# Patient Record
Sex: Female | Born: 1965 | Race: Black or African American | Hispanic: No | Smoking: Never smoker
Health system: Southern US, Community
[De-identification: ages and names within clinical notes are randomized; demographics above are authoritative.]

## PROBLEM LIST (undated history)

## (undated) DIAGNOSIS — D649 Anemia, unspecified: Secondary | ICD-10-CM

## (undated) DIAGNOSIS — R51 Headache: Secondary | ICD-10-CM

## (undated) DIAGNOSIS — D3500 Benign neoplasm of unspecified adrenal gland: Secondary | ICD-10-CM

## (undated) DIAGNOSIS — E119 Type 2 diabetes mellitus without complications: Secondary | ICD-10-CM

## (undated) DIAGNOSIS — I10 Essential (primary) hypertension: Secondary | ICD-10-CM

## (undated) DIAGNOSIS — I7300 Raynaud's syndrome without gangrene: Secondary | ICD-10-CM

## (undated) DIAGNOSIS — K219 Gastro-esophageal reflux disease without esophagitis: Secondary | ICD-10-CM

## (undated) DIAGNOSIS — E269 Hyperaldosteronism, unspecified: Secondary | ICD-10-CM

## (undated) DIAGNOSIS — T7840XA Allergy, unspecified, initial encounter: Secondary | ICD-10-CM

## (undated) DIAGNOSIS — R519 Headache, unspecified: Secondary | ICD-10-CM

## (undated) DIAGNOSIS — E785 Hyperlipidemia, unspecified: Secondary | ICD-10-CM

## (undated) DIAGNOSIS — E876 Hypokalemia: Secondary | ICD-10-CM

## (undated) DIAGNOSIS — N804 Endometriosis of rectovaginal septum, unspecified involvement of vagina: Secondary | ICD-10-CM

## (undated) DIAGNOSIS — N83209 Unspecified ovarian cyst, unspecified side: Secondary | ICD-10-CM

## (undated) HISTORY — DX: Hyperaldosteronism, unspecified: E26.9

## (undated) HISTORY — DX: Hypokalemia: E87.6

## (undated) HISTORY — PX: TUBAL LIGATION: SHX77

## (undated) HISTORY — DX: Essential (primary) hypertension: I10

## (undated) HISTORY — PX: CARDIAC CATHETERIZATION: SHX172

## (undated) HISTORY — DX: Anemia, unspecified: D64.9

## (undated) HISTORY — DX: Benign neoplasm of unspecified adrenal gland: D35.00

## (undated) HISTORY — DX: Endometriosis of rectovaginal septum and vagina: N80.4

## (undated) HISTORY — DX: Raynaud's syndrome without gangrene: I73.00

## (undated) HISTORY — DX: Hyperlipidemia, unspecified: E78.5

## (undated) HISTORY — DX: Type 2 diabetes mellitus without complications: E11.9

## (undated) HISTORY — DX: Allergy, unspecified, initial encounter: T78.40XA

## (undated) HISTORY — DX: Endometriosis of rectovaginal septum, unspecified involvement of vagina: N80.40

---

## 2007-09-17 ENCOUNTER — Encounter (INDEPENDENT_AMBULATORY_CARE_PROVIDER_SITE_OTHER): Payer: Self-pay | Admitting: Obstetrics and Gynecology

## 2007-09-17 ENCOUNTER — Ambulatory Visit (HOSPITAL_COMMUNITY): Admission: RE | Admit: 2007-09-17 | Discharge: 2007-09-17 | Payer: Self-pay | Admitting: Obstetrics and Gynecology

## 2008-01-22 ENCOUNTER — Emergency Department (HOSPITAL_COMMUNITY): Admission: EM | Admit: 2008-01-22 | Discharge: 2008-01-22 | Payer: Self-pay | Admitting: Family Medicine

## 2008-09-11 ENCOUNTER — Encounter: Admission: RE | Admit: 2008-09-11 | Discharge: 2008-09-11 | Payer: Self-pay | Admitting: Obstetrics and Gynecology

## 2008-11-19 ENCOUNTER — Encounter: Admission: RE | Admit: 2008-11-19 | Discharge: 2008-11-19 | Payer: Self-pay | Admitting: Cardiology

## 2008-11-24 ENCOUNTER — Ambulatory Visit (HOSPITAL_COMMUNITY): Admission: RE | Admit: 2008-11-24 | Discharge: 2008-11-24 | Payer: Self-pay | Admitting: Cardiology

## 2008-12-21 ENCOUNTER — Encounter: Admission: RE | Admit: 2008-12-21 | Discharge: 2009-03-21 | Payer: Self-pay | Admitting: Internal Medicine

## 2010-09-12 LAB — BASIC METABOLIC PANEL
CO2: 27 mEq/L (ref 19–32)
Calcium: 9 mg/dL (ref 8.4–10.5)
Calcium: 9.2 mg/dL (ref 8.4–10.5)
Creatinine, Ser: 0.65 mg/dL (ref 0.4–1.2)
GFR calc Af Amer: >60 mL/min (ref >60)
GFR calc Af Amer: >60 mL/min (ref >60)
GFR calc non Af Amer: >60 mL/min (ref >60)
Sodium: 139 mEq/L (ref 135–145)

## 2010-09-12 LAB — GLUCOSE, CAPILLARY: Glucose-Capillary: 106 mg/dL — ABNORMAL HIGH (ref 70–99)

## 2010-10-18 NOTE — Cardiovascular Report (Signed)
NAME:  Kristin Hess, THAKER NO.:  [id number]   MEDICAL RECORD NO.:  [id number]          PATIENT TYPE:  OIB   LOCATION:  2899                         FACILITY:  [facility]   PHYSICIAN:  Cristy Hilts. Jacinto Halim, MD       DATE OF BIRTH:  [birth year]   DATE OF PROCEDURE:  11/24/2008  DATE OF DISCHARGE:  11/24/2008                            CARDIAC CATHETERIZATION   PRIMARY OPERATOR:  Richard A. Alanda Amass, MD   PROCTORING PHYSICIAN:  Cristy Hilts. Jacinto Halim, MD   PROCEDURE PERFORMED:  Coronary arteriography including selective left  and right coronary arteriography.   INDICATIONS:  Ms. [name] is a 45 year old female with a diet-  controlled diabetes, hypertension, and obesity, who has been complaining  of chest pain and dyspnea on exertion.  She had undergone a stress test,  started on outpatient basis, which had revealed antral ischemia.  Hence  she is brought to the cardiac catheterization lab to evaluate her  coronary anatomy.   HEMODYNAMIC DATA:  The aortic pressure was 129/84 with a mean of 105  mmHg.   Left main coronary artery:  Left main coronary artery is a large-caliber  vessel, which immediately bifurcates into the circumflex and LAD.   Circumflex:  Circumflex is a large-caliber vessel.  It gives origin to a  tiny obtuse marginal 1, large obtuse marginal 2, and distally gives  origin to PDA branches.  It is codominant with right coronary artery.   LAD:  LAD is a large-caliber vessel.  Smooth and normal.   Right coronary artery:  Right coronary artery is codominant with  circumflex coronary artery giving origin to small PDA and PLA branches.  Smooth and normal.   IMPRESSION:  False positive stress test probably related to breast  attenuation.  Normal coronary arteries.   RECOMMENDATIONS:  The patient has significant cardiovascular risk that  includes diabetes, hypertension, and morbid obesity.  She will need very  strict primary control, primary prevention strategy.   She is being placed on lisinopril and hydrochlorothiazide because of  severe hypokalemia.  We will change it lisinopril 20 mg once a day.  She  will continue with low dose of her beta-blocker and along with aspirin  at 81 mg p.o. daily.  She will be discharged home today.   TECHNIQUE OF PROCEDURE:  Under usual sterile precautions using a 5-  French right radial access, a 5-French Jackey catheter was advanced into  the ascending aorta and selective right and left coronary arteriography  was performed.  The catheter was then pulled out of the body.  The  patient tolerated the procedure well.  The right radial access  hemostasis obtained using a TR band injecting 10 mL of air.  No  immediate complications noted.      Cristy Hilts. Jacinto Halim, MD     JRG/MEDQ  D:  11/24/2008  T:  11/24/2008  Job:  [id number]   cc:   Tammy R. Collins [place], M.D.  Richard A. Alanda Amass, M.D.

## 2010-10-18 NOTE — Op Note (Signed)
NAME:  Kristin Hess, Kristin Hess NO.:  [id number]   MEDICAL RECORD NO.:  [id number]          PATIENT TYPE:  AMB   LOCATION:  SDC                           FACILITY:  [facility]   PHYSICIAN:  Osborn Coho, M.D.   DATE OF BIRTH:  03-09-1966   DATE OF PROCEDURE:  09/17/2007  DATE OF DISCHARGE:                               OPERATIVE REPORT   PREOPERATIVE DIAGNOSES:  1. Menorrhagia  2. Polyps.   POSTOPERATIVE DIAGNOSES:  1. Menorrhagia  2. Polyps.   PROCEDURE:  1. Hysteroscopy.  2. D&C.  3. NovaSure ablation.   ATTENDING:  Dr. Osborn Coho.   ANESTHESIA:  General via LMA.   SPECIMENS TO PATHOLOGY:  Endometrial curettings.   FLUIDS:  600 mL in the OR, 1300 mL in day surgery prior to procedure.   URINE OUTPUT:  Greater than 500 mL via straight catheter prior to  procedure.   HYSTEROSCOPIC FLUID DEFICIT:  Of lactated Ringer's 185 mL.   ESTIMATED BLOOD LOSS:  Minimal.   COMPLICATIONS:  None.   PROCEDURE:  The patient was taken to the operating room.  After risks,  benefits, and alternatives discussed with the patient, the patient  verbalized understanding and consent signed and witnessed.  The patient  was placed under general per anesthesia and prepped and draped in the  normal sterile fashion in the dorsal lithotomy position.  A bivalve  speculum was placed in the patient's vagina, and a paracervical block  administered using a total of 10 mL of 1% lidocaine.  The anterior lip  of the cervix was grasped with a single-tooth tenaculum, and the cervix  was measured to 4.5 cm.  The uterus sounded to 11 cm.  The cervix was  dilated for passage of the hysteroscope.  The hysteroscope was  introduced and multiple polyps noted.  Curettage was performed until a  gritty texture was noted.  However, on the posterior and left lateral  sidewall, it felt somewhat smooth still.  Hysteroscope introduced once  again, and no obvious intracavitary lesions were noted.  The NovaSure  instrument was placed into the uterine cavity, and the cavity width  measured 5.0 cm, and while the instrument was set at 6.5 cm for cavity  length, ablation was performed without difficulty at a power 179 watts  for a time of 43 seconds.  The hysteroscope was reintroduced into the  cavity after NovaSure instrument was removed, and good results of  ablation was noted throughout except on a small portion of the lower  uterine segment on the right side of the uterus.  All instruments were  then removed, and there was good hemostasis at the tenaculum site.  Count was correct.  The patient tolerated the procedure well and was  awaiting transfer to the recovery room in good condition.      Osborn Coho, M.D.  Electronically Signed     AR/MEDQ  D:  09/17/2007  T:  09/17/2007  Job:  [id number]

## 2011-02-28 LAB — CBC
MCHC: 33.6
Platelets: 214
RBC: 4.45
WBC: 6.7

## 2011-02-28 LAB — HCG, SERUM, QUALITATIVE: Preg, Serum: NEGATIVE

## 2011-08-25 ENCOUNTER — Other Ambulatory Visit: Payer: Self-pay | Admitting: Internal Medicine

## 2011-08-25 DIAGNOSIS — R51 Headache: Secondary | ICD-10-CM

## 2011-08-28 ENCOUNTER — Ambulatory Visit
Admission: RE | Admit: 2011-08-28 | Discharge: 2011-08-28 | Disposition: A | Discharge status: Home / Self Care | Payer: 59 | Source: Ambulatory Visit | Attending: Internal Medicine | Admitting: Internal Medicine

## 2011-08-28 DIAGNOSIS — R51 Headache: Secondary | ICD-10-CM

## 2011-08-29 ENCOUNTER — Other Ambulatory Visit: Payer: Self-pay | Admitting: Internal Medicine

## 2011-08-29 DIAGNOSIS — R51 Headache: Secondary | ICD-10-CM

## 2011-08-30 ENCOUNTER — Other Ambulatory Visit: Payer: Self-pay

## 2011-08-31 ENCOUNTER — Ambulatory Visit
Admission: RE | Admit: 2011-08-31 | Discharge: 2011-08-31 | Disposition: A | Discharge status: Home / Self Care | Payer: 59 | Source: Ambulatory Visit | Attending: Internal Medicine | Admitting: Internal Medicine

## 2011-08-31 DIAGNOSIS — R51 Headache: Secondary | ICD-10-CM

## 2011-08-31 MED ORDER — GADOBENATE DIMEGLUMINE 529 MG/ML IV SOLN
18.0000 mL | Freq: Once | INTRAVENOUS | Status: AC | PRN
  Administered 2011-08-31: 18 mL via INTRAVENOUS

## 2011-09-04 ENCOUNTER — Other Ambulatory Visit: Payer: Self-pay | Admitting: Obstetrics and Gynecology

## 2011-09-04 DIAGNOSIS — Z1231 Encounter for screening mammogram for malignant neoplasm of breast: Secondary | ICD-10-CM

## 2011-09-08 ENCOUNTER — Ambulatory Visit
Admission: RE | Admit: 2011-09-08 | Discharge: 2011-09-08 | Disposition: A | Discharge status: Home / Self Care | Payer: 59 | Source: Ambulatory Visit | Attending: Obstetrics and Gynecology | Admitting: Obstetrics and Gynecology

## 2011-09-08 DIAGNOSIS — Z1231 Encounter for screening mammogram for malignant neoplasm of breast: Secondary | ICD-10-CM

## 2011-10-02 ENCOUNTER — Other Ambulatory Visit: Payer: Self-pay | Admitting: Neurology

## 2011-10-02 ENCOUNTER — Ambulatory Visit
Admission: RE | Admit: 2011-10-02 | Discharge: 2011-10-02 | Disposition: A | Discharge status: Home / Self Care | Payer: 59 | Source: Ambulatory Visit | Attending: Neurology | Admitting: Neurology

## 2011-10-02 DIAGNOSIS — G971 Other reaction to spinal and lumbar puncture: Secondary | ICD-10-CM

## 2011-10-02 MED ORDER — IOHEXOL 180 MG/ML  SOLN
1.0000 mL | Freq: Once | INTRAMUSCULAR | Status: AC | PRN
  Administered 2011-10-02: 1 mL via EPIDURAL

## 2011-10-02 NOTE — Discharge Instructions (Signed)
Epidural Blood Patch Discharge Instructions  1. Go home and rest quietly for the next 24 hours.  It is important to lie flat for the next 24 hours.  Get up only to go to the restroom.  You may lie in the bed or on a couch on your back, your stomach, your left side or your right side.  You may have one pillow under your head.  You may have pillows between your knees while you are on your side or under your knees while you are on your back.  2. DO NOT drive today.  Recline the seat as far back as it will go, while still wearing your seat belt, on the way home.  3. You may get up to go to the bathroom as needed.  You may sit up for 10 minutes to eat.  You may resume your normal diet and medications unless otherwise indicated.  Drink plenty of extra fluids today and tomorrow.  4. The incidence of a spinal headache with nausea and/or vomiting is about 5% (one in 20 patients).  If you develop a headache, lie flat and drink plenty of fluids until the headache goes away.  Caffeinated beverages may be helpful.  If you develop severe nausea and vomiting or a headache that does not go away with flat bed rest, call [phone].  5. You may resume normal activities after your 24 hours of bed rest is over; however, do not exert yourself strongly or do any heavy lifting tomorrow.  6. Call your physician for a follow-up appointment.   

## 2011-10-02 NOTE — Progress Notes (Signed)
20cc blood drawn from left wrist for procedure; site unremarkable.  jkl

## 2011-10-25 ENCOUNTER — Ambulatory Visit: Payer: Self-pay | Admitting: Obstetrics and Gynecology

## 2012-03-22 ENCOUNTER — Ambulatory Visit (INDEPENDENT_AMBULATORY_CARE_PROVIDER_SITE_OTHER): Payer: 59 | Admitting: Obstetrics and Gynecology

## 2012-03-22 ENCOUNTER — Encounter: Payer: Self-pay | Admitting: Obstetrics and Gynecology

## 2012-03-22 VITALS — BP 100/70 | Resp 14 | Ht 67.5 in | Wt 195.0 lb

## 2012-03-22 DIAGNOSIS — Z124 Encounter for screening for malignant neoplasm of cervix: Secondary | ICD-10-CM

## 2012-03-22 DIAGNOSIS — Z309 Encounter for contraceptive management, unspecified: Secondary | ICD-10-CM

## 2012-03-22 DIAGNOSIS — Z113 Encounter for screening for infections with a predominantly sexual mode of transmission: Secondary | ICD-10-CM

## 2012-03-22 NOTE — Progress Notes (Signed)
Patient ID: Kristin Hess, female   DOB: 03-17-1966, 46 y.o.   MRN: [id number] Contraception Mirena Last pap 11/2010 wnl Last Mammo Spring 2013 wnl per pt. Last Colonoscopy never Last Dexa Scan never Primary MD Dr. Dr. Quentin Mulling Abuse at Home none   Filed Vitals:   03/22/12 1000  BP: 100/70  Resp: 14   ROS: noncontributory  Physical Examination: General appearance - alert, well appearing, and in no distress Neck - supple, no significant adenopathy Chest - clear to auscultation, no wheezes, rales or rhonchi, symmetric air entry Heart - normal rate and regular rhythm Abdomen - soft, nontender, nondistended, no masses or organomegaly Breasts - breasts appear normal, no suspicious masses, no skin or nipple changes or axillary nodes Pelvic - normal external genitalia, vulva, vagina, cervix, uterus and adnexa, IUD string visible Back exam - no CVAT Extremities - no edema, redness or tenderness in the calves or thighs  A/P Pap today GC/CT with consent pre IUD AEX in 77yr Neg mammo in April

## 2012-03-26 LAB — PAP IG, CT-NG, RFX HPV ASCU

## 2012-07-16 ENCOUNTER — Other Ambulatory Visit: Payer: Self-pay | Admitting: Neurology

## 2012-07-16 DIAGNOSIS — R9409 Abnormal results of other function studies of central nervous system: Secondary | ICD-10-CM

## 2012-07-16 DIAGNOSIS — G43019 Migraine without aura, intractable, without status migrainosus: Secondary | ICD-10-CM

## 2012-08-09 ENCOUNTER — Other Ambulatory Visit: Payer: 59

## 2012-08-16 ENCOUNTER — Other Ambulatory Visit: Payer: 59

## 2012-08-23 ENCOUNTER — Ambulatory Visit
Admission: RE | Admit: 2012-08-23 | Discharge: 2012-08-23 | Disposition: A | Discharge status: Home / Self Care | Payer: 59 | Source: Ambulatory Visit | Attending: Neurology | Admitting: Neurology

## 2012-08-23 DIAGNOSIS — R9409 Abnormal results of other function studies of central nervous system: Secondary | ICD-10-CM

## 2012-08-23 DIAGNOSIS — G43019 Migraine without aura, intractable, without status migrainosus: Secondary | ICD-10-CM

## 2012-08-23 MED ORDER — GADOBENATE DIMEGLUMINE 529 MG/ML IV SOLN
18.0000 mL | Freq: Once | INTRAVENOUS | Status: AC | PRN
  Administered 2012-08-23: 18 mL via INTRAVENOUS

## 2012-08-26 ENCOUNTER — Telehealth: Payer: Self-pay | Admitting: Neurology

## 2012-08-26 NOTE — Telephone Encounter (Signed)
I called the patient. The MRI of the brain is unchanged from one year ago. Will recheck in one year.

## 2012-11-12 ENCOUNTER — Other Ambulatory Visit: Payer: Self-pay | Admitting: Internal Medicine

## 2012-11-12 DIAGNOSIS — K6289 Other specified diseases of anus and rectum: Secondary | ICD-10-CM

## 2012-11-12 DIAGNOSIS — K921 Melena: Secondary | ICD-10-CM

## 2012-11-12 DIAGNOSIS — R109 Unspecified abdominal pain: Secondary | ICD-10-CM

## 2012-11-15 ENCOUNTER — Ambulatory Visit
Admission: RE | Admit: 2012-11-15 | Discharge: 2012-11-15 | Disposition: A | Discharge status: Home / Self Care | Payer: 59 | Source: Ambulatory Visit | Attending: Internal Medicine | Admitting: Internal Medicine

## 2012-11-15 DIAGNOSIS — K921 Melena: Secondary | ICD-10-CM

## 2012-11-15 DIAGNOSIS — R109 Unspecified abdominal pain: Secondary | ICD-10-CM

## 2012-11-15 DIAGNOSIS — K6289 Other specified diseases of anus and rectum: Secondary | ICD-10-CM

## 2012-11-15 MED ORDER — IOHEXOL 300 MG/ML  SOLN
100.0000 mL | Freq: Once | INTRAMUSCULAR | Status: AC | PRN
  Administered 2012-11-15: 100 mL via INTRAVENOUS

## 2012-11-18 ENCOUNTER — Other Ambulatory Visit (HOSPITAL_COMMUNITY): Payer: Self-pay | Admitting: Physician Assistant

## 2012-11-18 DIAGNOSIS — N83209 Unspecified ovarian cyst, unspecified side: Secondary | ICD-10-CM

## 2012-11-22 ENCOUNTER — Other Ambulatory Visit (HOSPITAL_COMMUNITY): Payer: Self-pay | Admitting: Physician Assistant

## 2012-11-22 DIAGNOSIS — N83209 Unspecified ovarian cyst, unspecified side: Secondary | ICD-10-CM

## 2012-11-25 ENCOUNTER — Ambulatory Visit (HOSPITAL_COMMUNITY)
Admission: RE | Admit: 2012-11-25 | Discharge: 2012-11-25 | Disposition: A | Discharge status: Home / Self Care | Payer: Private Health Insurance - Indemnity | Source: Ambulatory Visit | Attending: Physician Assistant | Admitting: Physician Assistant

## 2012-11-25 DIAGNOSIS — Z30431 Encounter for routine checking of intrauterine contraceptive device: Secondary | ICD-10-CM | POA: Insufficient documentation

## 2012-11-25 DIAGNOSIS — D251 Intramural leiomyoma of uterus: Secondary | ICD-10-CM | POA: Insufficient documentation

## 2012-11-25 DIAGNOSIS — N83209 Unspecified ovarian cyst, unspecified side: Secondary | ICD-10-CM | POA: Insufficient documentation

## 2013-07-29 ENCOUNTER — Ambulatory Visit: Payer: Self-pay | Admitting: Physician Assistant

## 2013-07-30 ENCOUNTER — Ambulatory Visit (INDEPENDENT_AMBULATORY_CARE_PROVIDER_SITE_OTHER): Payer: Private Health Insurance - Indemnity | Admitting: Physician Assistant

## 2013-07-30 ENCOUNTER — Encounter: Payer: Self-pay | Admitting: Physician Assistant

## 2013-07-30 VITALS — BP 120/72 | HR 84 | Temp 97.9°F | Resp 16 | Ht 67.5 in | Wt 207.0 lb

## 2013-07-30 DIAGNOSIS — I10 Essential (primary) hypertension: Secondary | ICD-10-CM | POA: Insufficient documentation

## 2013-07-30 DIAGNOSIS — Z79899 Other long term (current) drug therapy: Secondary | ICD-10-CM

## 2013-07-30 DIAGNOSIS — E559 Vitamin D deficiency, unspecified: Secondary | ICD-10-CM

## 2013-07-30 DIAGNOSIS — E1169 Type 2 diabetes mellitus with other specified complication: Secondary | ICD-10-CM | POA: Insufficient documentation

## 2013-07-30 DIAGNOSIS — E1159 Type 2 diabetes mellitus with other circulatory complications: Secondary | ICD-10-CM | POA: Insufficient documentation

## 2013-07-30 DIAGNOSIS — E785 Hyperlipidemia, unspecified: Secondary | ICD-10-CM

## 2013-07-30 DIAGNOSIS — E782 Mixed hyperlipidemia: Secondary | ICD-10-CM | POA: Insufficient documentation

## 2013-07-30 DIAGNOSIS — E119 Type 2 diabetes mellitus without complications: Secondary | ICD-10-CM

## 2013-07-30 LAB — CBC WITH DIFFERENTIAL/PLATELET
Basophils Absolute: 0 10*3/uL (ref 0.0–0.1)
Basophils Relative: 0 % (ref 0–1)
EOS ABS: 0.1 10*3/uL (ref 0.0–0.7)
EOS PCT: 1 % (ref 0–5)
HCT: 39.7 % (ref 36.0–46.0)
Hemoglobin: 13.1 g/dL (ref 12.0–15.0)
LYMPHS ABS: 1.6 10*3/uL (ref 0.7–4.0)
Lymphocytes Relative: 18 % (ref 12–46)
MCH: 27.4 pg (ref 26.0–34.0)
MCHC: 33 g/dL (ref 30.0–36.0)
MCV: 83.1 fL (ref 78.0–100.0)
MONO ABS: 0.6 10*3/uL (ref 0.1–1.0)
MONOS PCT: 7 % (ref 3–12)
Neutro Abs: 6.7 10*3/uL (ref 1.7–7.7)
Neutrophils Relative %: 74 % (ref 43–77)
PLATELETS: 203 10*3/uL (ref 150–400)
RBC: 4.78 MIL/uL (ref 3.87–5.11)
RDW: 13.7 % (ref 11.5–15.5)
WBC: 9 10*3/uL (ref 4.0–10.5)

## 2013-07-30 NOTE — Progress Notes (Signed)
HPI 48 y.o. female  presents for 3 month follow up with hypertension, hyperlipidemia, diabetes and vitamin D. Her blood pressure has been controlled at home, today their BP is BP: 120/72 mmHg She denies chest pain, shortness of breath, dizziness.  Her cholesterol is diet controlled.  Her cholesterol is not at goal. The cholesterol last visit was:  LDL 120 She has not been working on diet and exercise for Diabetes, and denies blurry vision, polyphagia and polyuria. + polydypsia, weight gain, and her sugars have been in the 200's for 4 days. Last A1C in the office was: 7.0 (8.1)  Patient is on Vitamin D supplement.  48 Sinus drainage for 1 week, but worse yesterday.   Current Medications:   aspirin 81 MG tablet  Take 81 mg by mouth daily.     carvedilol 12.5 MG tablet  Commonly known as:  COREG  Take 12.5 mg by mouth 2 (two) times daily with a meal.     losartan 100 MG tablet  Commonly known as:  COZAAR  Take 100 mg by mouth daily.     spironolactone 100 MG tablet  Commonly known as:  ALDACTONE  Take 100 mg by mouth 2 (two) times daily.     topiramate 25 MG tablet  Commonly known as:  TOPAMAX  Take 25 mg by mouth 2 (two) times daily.     VITAMIN D PO  Take by mouth daily.       Medical History:  Past Medical History  Diagnosis Date  . Hypertension   . Diabetes mellitus without complication   . Allergy   . Anemia   . Adrenal adenoma   . Hyperaldosteronism     Possibly  . Hypokalemia   . Hyperlipidemia    Allergies: No Known Allergies   Review of Systems: [X]  = complains of  [ ]  = denies  General: Fatigue Valu.Nieves ] Fever [ ]  Chills [ ]  Weakness [ ]   Insomnia [ ]  Eyes: Redness [ ]  Blurred vision [ ]  Diplopia [ ]   ENT: Congestion Valu.Nieves ] Sinus Pain [ ]  Post Nasal Drip Valu.Nieves ] Sore Throat [ ]  Earache [ ]   Cardiac: Chest pain/pressure [ ]  SOB [ ]  Orthopnea [ ]   Palpitations [ ]   Paroxysmal nocturnal dyspnea[ ]  Claudication [ ]  Edema [ ]   Pulmonary: Cough [ ]  Wheezing[ ]   SOB [ ]    Snoring [ ]   GI: Nausea [ ]  Vomiting[ ]  Dysphagia[ ]  Heartburn[ ]  Abdominal pain [ ]  Constipation [ ] ; Diarrhea [ ] ; BRBPR [ ]  Melena[ ]  GU: Hematuria[ ]  Dysuria [ ]  Nocturia[ ]  Urgency [ ]   Hesitancy [ ]  Discharge [ ]  Neuro: Headaches[X ] Vertigo[ ]  Paresthesias[ ]  Spasm [ ]  Speech changes [ ]  Incoordination [ ]   Ortho: Arthritis [ ]  Joint pain [ ]  Muscle pain [ ]  Joint swelling [ ]  Back Pain [ ]  Skin:  Rash [ ]   Pruritis [ ]  Change in skin lesion [ ]   Psych: Depression[ ]  Anxiety[ ]  Confusion [ ]  Memory loss [ ]   Heme/Lypmh: Bleeding [ ]  Bruising [ ]  Enlarged lymph nodes [ ]   Endocrine: Visual blurring [ ]  Paresthesia [ ]  Polyuria [ ]  Polydypsea Valu.Nieves ]   Heat/cold intolerance [ X] Hypoglycemia [ ]   Family history- Review and unchanged Social history- Review and unchanged Physical Exam: Filed Vitals:   07/30/13 1354  BP: 120/72  Pulse: 84  Temp: 97.9 F (36.6 C)  Resp: 16   Wt Readings from Last  3 Encounters:  07/30/13 207 lb (93.895 kg)  03/22/12 195 lb (88.451 kg)   General Appearance: Well nourished, in no apparent distress. Eyes: PERRLA, EOMs, conjunctiva no swelling or erythema Sinuses: No Frontal/maxillary tenderness ENT/Mouth: Ext aud canals clear, TMs without erythema, bulging. No erythema, swelling, or exudate on post pharynx.  Tonsils not swollen or erythematous. Hearing normal.  Neck: Supple, thyroid normal.  Respiratory: Respiratory effort normal, BS equal bilaterally without rales, rhonchi, wheezing or stridor.  Cardio: RRR with no MRGs. Brisk peripheral pulses without edema.  Abdomen: Soft, + BS.  Non tender, no guarding, rebound, hernias, masses. Lymphatics: Non tender without lymphadenopathy.  Musculoskeletal: Full ROM, 5/5 strength, normal gait.  Skin: Warm, dry without rashes, lesions, ecchymosis.  Neuro: Cranial nerves intact. No cerebellar symptoms. Sensation intact.  Psych: Awake and oriented X 3, normal affect, Insight and Judgment appropriate.    Assessment and Plan:  Hypertension: Continue medication, monitor blood pressure at home.  Continue DASH diet. Cholesterol: Continue diet and exercise. Check cholesterol.  Diabetes-Continue diet and exercise. Check A1C. Add metformin for now, discussed the likeihood of needing dual therapy to control sugar, will try invokana or Tanzeum once a week injectable Vitamin D Def- check level and continue medications.   Continue diet and meds as discussed. Further disposition pending results of labs. Discussed med's effects and SE's.  OVER 40 minutes of exam, counseling, chart review, referral performed   Vicie Mutters 2:06 PM

## 2013-07-30 NOTE — Patient Instructions (Addendum)
will try invokana or Tanzeum once a week injectable  We are starting you on Metformin to prevent or treat diabetes. Metformin does not cause low blood sugars. In order to create energy your cells need insulin and sugar but sometime your cells do not accept the insulin and this can cause increased sugars and decreased energy. The Metformin helps your cells accept insulin and the sugar to give you more energy.   The two most common side effects are nausea and diarrhea, follow these rules to avoid it! You can take imodium per box instructions when starting metformin if needed.   Rules of metformin: 1) start out slow with only one pill daily. Our goal for you is 4 pills a day or 2000mg  total.  2) take with your largest meal. 3) Take with least amount of carbs.   Call if you have any problems.     Bad carbs also include fruit juice, alcohol, and sweet tea. These are empty calories that do not signal to your brain that you are full.   Please remember the good carbs are still carbs which convert into sugar. So please measure them out no more than 1/2-1 cup of rice, oatmeal, pasta, and beans.  Veggies are however free foods! Pile them on.   I like lean protein at every meal such as chicken, [place], pork chops, cottage cheese, etc. Just do not fry these meats and please center your meal around vegetable, the meats should be a side dish.   No all fruit is created equal. Please see the list below, the fruit at the bottom is higher in sugars than the fruit at the top

## 2013-07-31 ENCOUNTER — Encounter: Payer: Self-pay | Admitting: Physician Assistant

## 2013-07-31 LAB — BASIC METABOLIC PANEL WITH GFR
BUN: 10 mg/dL (ref 6–23)
CALCIUM: 9.9 mg/dL (ref 8.4–10.5)
CO2: 26 meq/L (ref 19–32)
CREATININE: 0.7 mg/dL (ref 0.50–1.10)
Chloride: 98 mEq/L (ref 96–112)
GFR, Est African American: >89 mL/min
GFR, Est Non African American: >89 mL/min
GLUCOSE: 213 mg/dL — AB (ref 70–99)
Potassium: 4 mEq/L (ref 3.5–5.3)
Sodium: 135 mEq/L (ref 135–145)

## 2013-07-31 LAB — HEPATIC FUNCTION PANEL
ALBUMIN: 4.6 g/dL (ref 3.5–5.2)
ALT: 19 U/L (ref 0–35)
AST: 16 U/L (ref 0–37)
Alkaline Phosphatase: 75 U/L (ref 39–117)
Bilirubin, Direct: 0.1 mg/dL (ref 0.0–0.3)
Indirect Bilirubin: 0.3 mg/dL (ref 0.2–1.2)
TOTAL PROTEIN: 7.3 g/dL (ref 6.0–8.3)
Total Bilirubin: 0.4 mg/dL (ref 0.2–1.2)

## 2013-07-31 LAB — LIPID PANEL
CHOLESTEROL: 185 mg/dL (ref 0–200)
HDL: 50 mg/dL (ref >39)
LDL Cholesterol: 97 mg/dL (ref 0–99)
Total CHOL/HDL Ratio: 3.7 Ratio
Triglycerides: 191 mg/dL — ABNORMAL HIGH (ref <150)
VLDL: 38 mg/dL (ref 0–40)

## 2013-07-31 LAB — TSH: TSH: 1.271 u[IU]/mL (ref 0.350–4.500)

## 2013-07-31 LAB — MAGNESIUM: MAGNESIUM: 1.6 mg/dL (ref 1.5–2.5)

## 2013-07-31 LAB — HEMOGLOBIN A1C
HEMOGLOBIN A1C: 9.9 % — AB (ref <5.7)
MEAN PLASMA GLUCOSE: 237 mg/dL — AB (ref <117)

## 2013-07-31 LAB — INSULIN, FASTING: Insulin fasting, serum: 109 u[IU]/mL — ABNORMAL HIGH (ref 3–28)

## 2013-07-31 LAB — VITAMIN D 25 HYDROXY (VIT D DEFICIENCY, FRACTURES): VIT D 25 HYDROXY: 57 ng/mL (ref 30–89)

## 2013-07-31 LAB — CORTISOL: Cortisol, Plasma: 9.7 ug/dL

## 2013-08-04 ENCOUNTER — Ambulatory Visit: Payer: Self-pay | Admitting: Physician Assistant

## 2013-08-05 ENCOUNTER — Other Ambulatory Visit: Payer: Self-pay

## 2013-08-05 MED ORDER — CANAGLIFLOZIN 300 MG PO TABS: 300.0000 mg | ORAL_TABLET | Freq: Every day | ORAL | Status: DC

## 2013-08-20 DIAGNOSIS — E876 Hypokalemia: Secondary | ICD-10-CM | POA: Insufficient documentation

## 2013-08-20 DIAGNOSIS — T7840XA Allergy, unspecified, initial encounter: Secondary | ICD-10-CM | POA: Insufficient documentation

## 2013-08-20 DIAGNOSIS — D649 Anemia, unspecified: Secondary | ICD-10-CM | POA: Insufficient documentation

## 2013-08-25 ENCOUNTER — Ambulatory Visit (INDEPENDENT_AMBULATORY_CARE_PROVIDER_SITE_OTHER): Payer: Private Health Insurance - Indemnity | Admitting: Physician Assistant

## 2013-08-25 ENCOUNTER — Encounter: Payer: Self-pay | Admitting: Physician Assistant

## 2013-08-25 VITALS — BP 122/72 | HR 76 | Temp 98.1°F | Resp 16 | Ht 67.5 in | Wt 201.0 lb

## 2013-08-25 DIAGNOSIS — Z0000 Encounter for general adult medical examination without abnormal findings: Secondary | ICD-10-CM

## 2013-08-25 DIAGNOSIS — R5381 Other malaise: Secondary | ICD-10-CM

## 2013-08-25 DIAGNOSIS — Z1331 Encounter for screening for depression: Secondary | ICD-10-CM

## 2013-08-25 DIAGNOSIS — Z79899 Other long term (current) drug therapy: Secondary | ICD-10-CM

## 2013-08-25 DIAGNOSIS — E119 Type 2 diabetes mellitus without complications: Secondary | ICD-10-CM

## 2013-08-25 DIAGNOSIS — Z789 Other specified health status: Secondary | ICD-10-CM

## 2013-08-25 DIAGNOSIS — Z1212 Encounter for screening for malignant neoplasm of rectum: Secondary | ICD-10-CM

## 2013-08-25 DIAGNOSIS — R5383 Other fatigue: Secondary | ICD-10-CM

## 2013-08-25 LAB — CBC WITH DIFFERENTIAL/PLATELET
BASOS PCT: 0 % (ref 0–1)
Basophils Absolute: 0 10*3/uL (ref 0.0–0.1)
EOS ABS: 0.1 10*3/uL (ref 0.0–0.7)
EOS PCT: 1 % (ref 0–5)
HCT: 39.1 % (ref 36.0–46.0)
Hemoglobin: 12.9 g/dL (ref 12.0–15.0)
LYMPHS ABS: 1.7 10*3/uL (ref 0.7–4.0)
Lymphocytes Relative: 27 % (ref 12–46)
MCH: 27.2 pg (ref 26.0–34.0)
MCHC: 33 g/dL (ref 30.0–36.0)
MCV: 82.3 fL (ref 78.0–100.0)
MONOS PCT: 6 % (ref 3–12)
Monocytes Absolute: 0.4 10*3/uL (ref 0.1–1.0)
NEUTROS PCT: 66 % (ref 43–77)
Neutro Abs: 4.2 10*3/uL (ref 1.7–7.7)
Platelets: 236 10*3/uL (ref 150–400)
RBC: 4.75 MIL/uL (ref 3.87–5.11)
RDW: 13.9 % (ref 11.5–15.5)
WBC: 6.4 10*3/uL (ref 4.0–10.5)

## 2013-08-25 LAB — BASIC METABOLIC PANEL WITH GFR
BUN: 10 mg/dL (ref 6–23)
CALCIUM: 10 mg/dL (ref 8.4–10.5)
CO2: 22 meq/L (ref 19–32)
CREATININE: 0.85 mg/dL (ref 0.50–1.10)
Chloride: 101 mEq/L (ref 96–112)
GFR, Est Non African American: 81 mL/min
Glucose, Bld: 157 mg/dL — ABNORMAL HIGH (ref 70–99)
Potassium: 4.2 mEq/L (ref 3.5–5.3)
SODIUM: 136 meq/L (ref 135–145)

## 2013-08-25 LAB — HEPATIC FUNCTION PANEL
ALBUMIN: 4.7 g/dL (ref 3.5–5.2)
ALT: 18 U/L (ref 0–35)
AST: 15 U/L (ref 0–37)
Alkaline Phosphatase: 73 U/L (ref 39–117)
BILIRUBIN DIRECT: 0.1 mg/dL (ref 0.0–0.3)
Indirect Bilirubin: 0.5 mg/dL (ref 0.2–1.2)
Total Bilirubin: 0.6 mg/dL (ref 0.2–1.2)
Total Protein: 7.6 g/dL (ref 6.0–8.3)

## 2013-08-25 LAB — IRON AND TIBC
%SAT: 28 % (ref 20–55)
Iron: 96 ug/dL (ref 42–145)
TIBC: 342 ug/dL (ref 250–470)
UIBC: 246 ug/dL (ref 125–400)

## 2013-08-25 LAB — MAGNESIUM: Magnesium: 2 mg/dL (ref 1.5–2.5)

## 2013-08-25 MED ORDER — CARVEDILOL 6.25 MG PO TABS: 6.2500 mg | ORAL_TABLET | Freq: Two times a day (BID) | ORAL | Status: DC

## 2013-08-25 NOTE — Patient Instructions (Addendum)
We want weight loss that will last so you should lose 1-2 pounds a week.  THAT IS IT! Please pick THREE things a month to change. Once it is a habit check off the item. Then pick another three items off the list to become habits.  If you are already doing a habit on the list GREAT!  Cross that item off! o Don't drink your calories. Ie, alcohol, soda, fruit juice, and sweet tea.  o Drink more water. Drink a glass when you feel hungry or before each meal.  o Eat breakfast - Complex carb and protein (likeDannon light and fit greek yogurt, oatmeal, fruit, eggs, [place] bacon). o Measure your cereal.  Eat no more than one cup a day. (ie [place])  BE MORE CONSISTENT  PLAN OUT MEALS o Eat an apple a day. o Add a vegetable a day. o Try a new vegetable a month. o Use Pam! Stop using oil or butter to cook. o Don't finish your plate or use smaller plates. o Share your dessert. o Eat sugar free Jello for dessert or frozen grapes. o Don't eat 2-3 hours before bed. o Switch to whole wheat bread, pasta, and brown rice. o Make healthier choices when you eat out. No fries! o Pick baked chicken, NOT fried. o Don't forget to SLOW DOWN when you eat. It is not going anywhere.  o Take the stairs. o Park far away in the parking lot o [employer] (or weights) for 10 minutes while watching TV. o Walk at work for 10 minutes during break. o Walk outside 1 time a week with your friend, kids, dog, or significant other. o Start a walking group at [place] the mall as much as you can tolerate.  o Keep a food diary. o Weigh yourself daily. o Walk for 15 minutes 3 days per week. o Cook at home more often and eat out less.  If life happens and you go back to old habits, it is okay.  Just start over. You can do it!   If you experience chest pain, get short of breath, or tired during the exercise, please stop immediately and inform your doctor.     Bad carbs also include fruit juice, alcohol, and sweet tea.  These are empty calories that do not signal to your brain that you are full.   Please remember the good carbs are still carbs which convert into sugar. So please measure them out no more than 1/2-1 cup of rice, oatmeal, pasta, and beans.  Veggies are however free foods! Pile them on.   I like lean protein at every meal such as chicken, [place], pork chops, cottage cheese, etc. Just do not fry these meats and please center your meal around vegetable, the meats should be a side dish.   No all fruit is created equal. Please see the list below, the fruit at the bottom is higher in sugars than the fruit at the top

## 2013-08-25 NOTE — Progress Notes (Signed)
Complete Physical HPI 48 y.o. female  presents for a complete physical. Her blood pressure has been controlled at home, today their BP is BP: 122/72 mmHg She does workout, she has been walking. She denies chest pain, shortness of breath, dizziness.  She is not on cholesterol medication and denies myalgias. Her cholesterol is not at goal, LDL of 70. The cholesterol last visit was:   Lab Results  Component Value Date   CHOL 185 07/30/2013   HDL 50 07/30/2013   LDLCALC 97 07/30/2013   TRIG 191* 07/30/2013   CHOLHDL 3.7 07/30/2013   She has been working on diet and exercise for diabetes, she was started on metformin and invokana 3 weeks ago for DM, she is up to 3 of the MF and she is down 6 lbs and denies foot ulcerations, nausea, paresthesia of the feet and visual disturbances. She has been checking her fasting A1C, and it has gone from 200's to 80's in the morning, she has had one low blood sugar of 70 and had to eat. Last A1C in the office was:  Lab Results  Component Value Date   HGBA1C 9.9* 07/30/2013   Patient is on Vitamin D supplement.   For breakfast she has bacon or bagel hollowed out, lunch is two hot dogs or veggie with tuna, dinner veggie/salad with a meat.  Patient states she will be visiting [place] and would like to know about needing a measles booster.   Current Medications:  Current Outpatient Prescriptions on File Prior to Visit  Medication Sig Dispense Refill  . aspirin 81 MG tablet Take 81 mg by mouth daily.      . Canagliflozin 300 MG TABS Take 1 tablet (300 mg total) by mouth daily.  30 tablet  11  . carvedilol (COREG) 12.5 MG tablet Take 12.5 mg by mouth 2 (two) times daily with a meal.      . Cholecalciferol (VITAMIN D PO) Take by mouth daily.      Marland Kitchen losartan (COZAAR) 100 MG tablet Take 100 mg by mouth daily.      Marland Kitchen spironolactone (ALDACTONE) 100 MG tablet Take 100 mg by mouth 2 (two) times daily.      Marland Kitchen topiramate (TOPAMAX) 25 MG tablet Take 25 mg by mouth 2 (two)  times daily.       No current facility-administered medications on file prior to visit.   Health Maintenance:   Immunization History  Administered Date(s) Administered  . Td 06/06/2007   Tetanus: 2009 Pneumovax: N/A Flu vaccine: Declines Zostavax: N/A Pap: 03/2012 MGM: 2014 DEXA: N/A  Colonoscopy: N/A EGD: N/A  Allergies:  Allergies  Allergen Reactions  . Ace Inhibitors Cough  . Hctz [Hydrochlorothiazide]    Medical History:  Past Medical History  Diagnosis Date  . Hypertension   . Diabetes mellitus without complication   . Adrenal adenoma   . Hyperaldosteronism     Possibly  . Hypokalemia   . Hyperlipidemia   . Allergy   . Anemia    Surgical History: No past surgical history on file. Family History:  Family History  Problem Relation Age of Onset  . Diabetes Father   . Hepatitis C Father   . Stroke Mother   . Diabetes Mother   . Stroke Brother    Social History:  History  Substance Use Topics  . Smoking status: Never Smoker   . Smokeless tobacco: Not on file  . Alcohol Use: No   Review of Systems: [X]  = complains of  [ ]  =  denies  General: Fatigue Valu.Nieves ] Fever [ ]  Chills [ ]  Weakness [ ]   Insomnia [ ] Weight change [ ]  Night sweats [ ]   Change in appetite [ ]  Eyes:  Dr. Nicki Reaper, Jan 2015, normal. Redness [ ]  Blurred vision [ ]  Diplopia [ ]  Discharge [ ]   ENT: Congestion [ ]  Sinus Pain [ ]  Post Nasal Drip [ ]  Sore Throat [ ]  Earache [ ]  hearing loss [ ]  Tinnitus [ ]  Snoring [ ]   Cardiac: Chest pain/pressure [ ]  SOB [ ]  Orthopnea [ ]   Palpitations [ ]   Paroxysmal nocturnal dyspnea[ ]  Claudication [ ]  Edema [ ]   Pulmonary: Cough [ ]  Wheezing[ ]   SOB [ ]   Pleurisy [ ]   GI: Nausea [ ]  Vomiting[ ]  Dysphagia[ ]  Heartburn[ ]  Abdominal pain [ ]  Constipation [ ] ; Diarrhea [ ]  BRBPR [ ]  Melena[ ]  Bloating [ ]  Hemorrhoids [ ]   GU: Hematuria[ ]  Dysuria [ ]  Nocturia[ ]  Urgency [ ]   Hesitancy [ ]  Discharge [ ]  Frequency [ ]   Breast:  Breast lumps [ ]   nipple discharge [ ]     Neuro: Headaches[ ]  Vertigo[ ]  Paresthesias[ ]  Spasm [ ]  Speech changes [ ]  Incoordination [ ]   Ortho: Arthritis [ ]  Joint pain [ ]  Muscle pain [ ]  Joint swelling [ ]  Back Pain [ ]  Skin:  Rash [ ]   Pruritis [ ]  Change in skin lesion [ ]   Psych: Depression[ ]  Anxiety[ ]  Confusion [ ]  Memory loss [ ]   Heme/Lypmh: Bleeding [ ]  Bruising [ ]  Enlarged lymph nodes [ ]   Endocrine: Visual blurring [ ]  Paresthesia [ ]  Polyuria [ ]  Polydypsea [ ]    Heat/cold intolerance [ ]  Hypoglycemia [ ]   Physical Exam: Estimated body mass index is 31 kg/(m^2) as calculated from the following:   Height as of this encounter: 5' 7.5" (1.715 m).   Weight as of this encounter: 201 lb (91.173 kg). BP 122/72  Pulse 76  Temp(Src) 98.1 F (36.7 C)  Resp 16  Ht 5' 7.5" (1.715 m)  Wt 201 lb (91.173 kg)  BMI 31.00 kg/m2 General Appearance: Well nourished, in no apparent distress. Eyes: PERRLA, EOMs, conjunctiva no swelling or erythema, normal fundi and vessels. Sinuses: No Frontal/maxillary tenderness ENT/Mouth: Ext aud canals clear, normal light reflex with TMs without erythema, bulging.  Good dentition. No erythema, swelling, or exudate on post pharynx. Tonsils not swollen or erythematous. Hearing normal.  Neck: Supple, thyroid normal. No bruits Respiratory: Respiratory effort normal, BS equal bilaterally without rales, rhonchi, wheezing or stridor. Cardio: RRR without murmurs, rubs or gallops. Brisk peripheral pulses without edema.  Chest: symmetric, with normal excursions and percussion. Breasts: defer Abdomen: Soft, +BS. Non tender, no guarding, rebound, hernias, masses, or organomegaly. .  Lymphatics: Non tender without lymphadenopathy.  Genitourinary: defer Musculoskeletal: Full ROM all peripheral extremities,5/5 strength, and normal gait. Skin: Warm, dry without rashes, lesions, ecchymosis.  Neuro: Cranial nerves intact, reflexes equal bilaterally. Normal muscle tone, no cerebellar symptoms. Sensation  intact.  Psych: Awake and oriented X 3, normal affect, Insight and Judgment appropriate.   EKG: defer  Assessment and Plan: Hypertension-- continue medications, DASH diet, exercise and monitor at home. Call if greater than 130/80. Can try to reduce Coreg, will call in 6.25 and can cut in half if gets dizzy/continues weight loss  Diabetes mellitus without complication- Discussed general issues about diabetes pathophysiology and management., Educational material distributed., Suggested low cholesterol diet., Encouraged aerobic exercise., Discussed foot care., Reminded to  get yearly retinal exam.  Adrenal adenoma- monitor  Hyperaldosteronism- monitor, continue aldactone  Hypokalemia- continue aldactone  Hyperlipidemia- -continue medications, check lipids, decrease fatty foods, increase activity.   Allergy- Allegra OTC, increase H20, allergy hygiene explained.  Anemia- monitor, continue iron supp with Vitamin C and increase green leafy veggies  Check measles titer Fatigue- check labs  Discussed med's effects and SE's. Screening labs and tests as requested with regular follow-up as recommended.   Vicie Mutters 9:10 AM

## 2013-08-26 ENCOUNTER — Telehealth: Payer: Self-pay | Admitting: Neurology

## 2013-08-26 LAB — URINALYSIS, ROUTINE W REFLEX MICROSCOPIC
BILIRUBIN URINE: NEGATIVE
Glucose, UA: >1000 mg/dL — AB
Hgb urine dipstick: NEGATIVE
Ketones, ur: NEGATIVE mg/dL
Leukocytes, UA: NEGATIVE
NITRITE: NEGATIVE
Protein, ur: NEGATIVE mg/dL
Urobilinogen, UA: 0.2 mg/dL (ref 0.0–1.0)
pH: 5.5 (ref 5.0–8.0)

## 2013-08-26 LAB — URINALYSIS, MICROSCOPIC ONLY
CRYSTALS: NONE SEEN
Casts: NONE SEEN

## 2013-08-26 LAB — RUBEOLA ANTIBODY IGG: Rubeola IgG: 27.1 AU/mL — ABNORMAL HIGH (ref <25.00)

## 2013-08-26 LAB — VITAMIN B12: Vitamin B-12: 401 pg/mL (ref 211–911)

## 2013-08-26 LAB — MICROALBUMIN / CREATININE URINE RATIO
CREATININE, URINE: 112.3 mg/dL
MICROALB/CREAT RATIO: 4.5 mg/g (ref 0.0–30.0)
Microalb, Ur: 0.5 mg/dL (ref 0.00–1.89)

## 2013-08-26 LAB — FERRITIN: FERRITIN: 96 ng/mL (ref 10–291)

## 2013-08-26 NOTE — Telephone Encounter (Signed)
I called the patient concerning a repeat MRI of the brain to follow up with the WM abnormalities that were seen previously. The patient is to call back and let us know if she wants the study. The patient has had a history of diabetes and hypertension, prior lumbar puncture was unremarkable, the patient is being followed for possible demyelinating disease.

## 2013-08-26 NOTE — Telephone Encounter (Signed)
Message copied by Margette Fast on Tue Aug 26, 2013  8:02 AM ------      Message from: Margette Fast      Created: Mon Aug 26, 2012  7:06 PM       Recheck MRI of the brain. ------

## 2013-10-30 ENCOUNTER — Encounter: Payer: Self-pay | Admitting: Physician Assistant

## 2013-10-30 ENCOUNTER — Ambulatory Visit (INDEPENDENT_AMBULATORY_CARE_PROVIDER_SITE_OTHER): Payer: Private Health Insurance - Indemnity | Admitting: Physician Assistant

## 2013-10-30 VITALS — BP 120/64 | HR 64 | Temp 98.1°F | Resp 16 | Wt 196.0 lb

## 2013-10-30 DIAGNOSIS — E785 Hyperlipidemia, unspecified: Secondary | ICD-10-CM

## 2013-10-30 DIAGNOSIS — I10 Essential (primary) hypertension: Secondary | ICD-10-CM

## 2013-10-30 DIAGNOSIS — E119 Type 2 diabetes mellitus without complications: Secondary | ICD-10-CM

## 2013-10-30 DIAGNOSIS — Z79899 Other long term (current) drug therapy: Secondary | ICD-10-CM

## 2013-10-30 LAB — HEMOGLOBIN A1C
Hgb A1c MFr Bld: 6.8 % — ABNORMAL HIGH (ref <5.7)
Mean Plasma Glucose: 148 mg/dL — ABNORMAL HIGH (ref <117)

## 2013-10-30 NOTE — Progress Notes (Signed)
Assessment and Plan:  Hypertension: Continue medication, monitor blood pressure at home. Continue DASH diet. Cholesterol: Continue diet and exercise. Check cholesterol.  Diabetes-Continue diet and exercise. Check A1C. Metformin is causing AB pain, even decreasing dose or trying to take with food. Pending A1C we will put her on JUST invokana or we can try the new Jardience/Trajenta combo if she needs further A1C reduction.  Vitamin D Def- check level and continue medications.  Obesity- down 11 lbs with Invokana, diet and exercise.   Continue diet and meds as discussed. Further disposition pending results of labs. Discussed med's effects and SE's.    HPI 48 y.o. female  presents for 3 month follow up with hypertension, hyperlipidemia, diabetes and vitamin D. Her blood pressure has been controlled at home, today their BP is BP: 120/64 mmHg She does workout. She denies chest pain, shortness of breath, dizziness.  She is not on cholesterol medication and denies myalgias. Her cholesterol is at goal. The cholesterol last visit was:   Lab Results  Component Value Date   CHOL 185 07/30/2013   HDL 50 07/30/2013   LDLCALC 97 07/30/2013   TRIG 191* 07/30/2013   CHOLHDL 3.7 07/30/2013   She has been working on diet and exercise for Diabetes, and denies paresthesia of the feet, polydipsia and polyuria. She states she is tolerating the invokana very well however the metfomrin given her epigastric abdominal pain and diarrhea, she has also had some very sharp temple pains that happen around when she taken the metformin. She does have some indigestion but denies melana/ blood in stool. Last A1C in the office was:  Lab Results  Component Value Date   HGBA1C 9.9* 07/30/2013   Patient is on Vitamin D supplement.   Wt Readings from Last 3 Encounters:  10/30/13 196 lb (88.905 kg)  08/25/13 201 lb (91.173 kg)  07/30/13 207 lb (93.895 kg)   Lab Results  Component Value Date   CREATININE 0.85 08/25/2013   BUN 10  08/25/2013   NA 136 08/25/2013   K 4.2 08/25/2013   CL 101 08/25/2013   CO2 22 08/25/2013    Current Medications:  Current Outpatient Prescriptions on File Prior to Visit  Medication Sig Dispense Refill  . aspirin 81 MG tablet Take 81 mg by mouth daily.      . Canagliflozin 300 MG TABS Take 1 tablet (300 mg total) by mouth daily.  30 tablet  11  . carvedilol (COREG) 6.25 MG tablet Take 1 tablet (6.25 mg total) by mouth 2 (two) times daily with a meal.  180 tablet  3  . Cholecalciferol (VITAMIN D PO) Take by mouth daily.      Marland Kitchen losartan (COZAAR) 100 MG tablet Take 100 mg by mouth daily.      . metFORMIN (GLUCOPHAGE-XR) 500 MG 24 hr tablet Take 500 mg by mouth daily. Take 2 tablets twice daily      . spironolactone (ALDACTONE) 100 MG tablet Take 100 mg by mouth 2 (two) times daily.      Marland Kitchen topiramate (TOPAMAX) 25 MG tablet Take 25 mg by mouth 2 (two) times daily.       No current facility-administered medications on file prior to visit.   Medical History:  Past Medical History  Diagnosis Date  . Hypertension   . Diabetes mellitus without complication   . Adrenal adenoma   . Hyperaldosteronism     Possibly  . Hypokalemia   . Hyperlipidemia   . Allergy   .  Anemia    Allergies:  Allergies  Allergen Reactions  . Ace Inhibitors Cough  . Hctz [Hydrochlorothiazide]      Review of Systems: [X]  = complains of  [ ]  = denies  General: Fatigue [ ]  Fever [ ]  Chills [ ]  Weakness [ ]   Insomnia [ ]  Eyes: Redness [ ]  Blurred vision [ ]  Diplopia [ ]   ENT: Congestion [ ]  Sinus Pain [ ]  Post Nasal Drip [ ]  Sore Throat [ ]  Earache [ ]   Cardiac: Chest pain/pressure [ ]  SOB [ ]  Orthopnea [ ]   Palpitations [ ]   Paroxysmal nocturnal dyspnea[ ]  Claudication [ ]  Edema [ ]   Pulmonary: Cough [ ]  Wheezing[ ]   SOB [ ]   Snoring [ ]   GI: Nausea [ ]  Vomiting[ ]  Dysphagia[ ]  Heartburn[ ]  Abdominal pain [ ]  Constipation [ ] ; Diarrhea [ ] ; BRBPR [ ]  Melena[ ]  GU: Hematuria[ ]  Dysuria [ ]  Nocturia[ ]  Urgency [ ]    Hesitancy [ ]  Discharge [ ]  Neuro: Headaches[ ]  Vertigo[ ]  Paresthesias[ ]  Spasm [ ]  Speech changes [ ]  Incoordination [ ]   Ortho: Arthritis [ ]  Joint pain [ ]  Muscle pain [ ]  Joint swelling [ ]  Back Pain [ ]  Skin:  Rash [ ]   Pruritis [ ]  Change in skin lesion [ ]   Psych: Depression[ ]  Anxiety[ ]  Confusion [ ]  Memory loss [ ]   Heme/Lypmh: Bleeding [ ]  Bruising [ ]  Enlarged lymph nodes [ ]   Endocrine: Visual blurring [ ]  Paresthesia [ ]  Polyuria [ ]  Polydypsea [ ]    Heat/cold intolerance [ ]  Hypoglycemia [ ]   Family history- Review and unchanged Social history- Review and unchanged Physical Exam: BP 120/64  Pulse 64  Temp(Src) 98.1 F (36.7 C)  Resp 16  Wt 196 lb (88.905 kg) Wt Readings from Last 3 Encounters:  10/30/13 196 lb (88.905 kg)  08/25/13 201 lb (91.173 kg)  07/30/13 207 lb (93.895 kg)   General Appearance: Well nourished, in no apparent distress. Eyes: PERRLA, EOMs, conjunctiva no swelling or erythema Sinuses: No Frontal/maxillary tenderness ENT/Mouth: Ext aud canals clear, TMs without erythema, bulging. No erythema, swelling, or exudate on post pharynx.  Tonsils not swollen or erythematous. Hearing normal.  Neck: Supple, thyroid normal.  Respiratory: Respiratory effort normal, BS equal bilaterally without rales, rhonchi, wheezing or stridor.  Cardio: RRR with no MRGs. Brisk peripheral pulses without edema.  Abdomen: Soft, + BS.  Non tender, no guarding, rebound, hernias, masses. Lymphatics: Non tender without lymphadenopathy.  Musculoskeletal: Full ROM, 5/5 strength, normal gait.  Skin: Warm, dry without rashes, lesions, ecchymosis.  Neuro: Cranial nerves intact. No cerebellar symptoms. Sensation intact.  Psych: Awake and oriented X 3, normal affect, Insight and Judgment appropriate.    Vicie Mutters 4:40 PM

## 2013-10-30 NOTE — Patient Instructions (Signed)
[employer]  We want weight loss that will last so you should lose 1-2 pounds a week.  THAT IS IT! Please pick THREE things a month to change. Once it is a habit check off the item. Then pick another three items off the list to become habits.  If you are already doing a habit on the list GREAT!  Cross that item off! o Don't drink your calories. Ie, alcohol, soda, fruit juice, and sweet tea.  o Drink more water. Drink a glass when you feel hungry or before each meal.  o Eat breakfast - Complex carb and protein (likeDannon light and fit yogurt, oatmeal, fruit, eggs, [place] bacon). o Measure your cereal.  Eat no more than one cup a day. (ie [place]) o Eat an apple a day. o Add a vegetable a day. o Try a new vegetable a month. o Use Pam! Stop using oil or butter to cook. o Don't finish your plate or use smaller plates. o Share your dessert. o Eat sugar free Jello for dessert or frozen grapes. o Don't eat 2-3 hours before bed. o Switch to whole wheat bread, pasta, and brown rice. o Make healthier choices when you eat out. No fries! o Pick baked chicken, NOT fried. o Don't forget to SLOW DOWN when you eat. It is not going anywhere.  o Take the stairs. o Park far away in the parking lot o [employer] (or weights) for 10 minutes while watching TV. o Walk at work for 10 minutes during break. o Walk outside 1 time a week with your friend, kids, dog, or significant other. o Start a walking group at [place] the mall as much as you can tolerate.  o Keep a food diary. o Weigh yourself daily. o Walk for 15 minutes 3 days per week. o Cook at home more often and eat out less.  If life happens and you go back to old habits, it is okay.  Just start over. You can do it!   If you experience chest pain, get short of breath, or tired during the exercise, please stop immediately and inform your doctor.     Bad carbs also include fruit juice, alcohol, and sweet tea. These are empty  calories that do not signal to your brain that you are full.   Please remember the good carbs are still carbs which convert into sugar. So please measure them out no more than 1/2-1 cup of rice, oatmeal, pasta, and beans.  Veggies are however free foods! Pile them on.   I like lean protein at every meal such as chicken, [place], pork chops, cottage cheese, etc. Just do not fry these meats and please center your meal around vegetable, the meats should be a side dish.   No all fruit is created equal. Please see the list below, the fruit at the bottom is higher in sugars than the fruit at the top

## 2013-10-31 LAB — HEPATIC FUNCTION PANEL
ALK PHOS: 76 U/L (ref 39–117)
ALT: 15 U/L (ref 0–35)
AST: 15 U/L (ref 0–37)
Albumin: 4.3 g/dL (ref 3.5–5.2)
Bilirubin, Direct: 0.1 mg/dL (ref 0.0–0.3)
Indirect Bilirubin: 0.4 mg/dL (ref 0.2–1.2)
TOTAL PROTEIN: 7.2 g/dL (ref 6.0–8.3)
Total Bilirubin: 0.5 mg/dL (ref 0.2–1.2)

## 2013-10-31 LAB — BASIC METABOLIC PANEL WITH GFR
BUN: 9 mg/dL (ref 6–23)
CHLORIDE: 100 meq/L (ref 96–112)
CO2: 21 mEq/L (ref 19–32)
Calcium: 9.4 mg/dL (ref 8.4–10.5)
Creat: 0.72 mg/dL (ref 0.50–1.10)
GFR, Est Non African American: >89 mL/min
GLUCOSE: 81 mg/dL (ref 70–99)
Potassium: 3.8 mEq/L (ref 3.5–5.3)
Sodium: 133 mEq/L — ABNORMAL LOW (ref 135–145)

## 2013-10-31 LAB — TSH: TSH: 2.692 u[IU]/mL (ref 0.350–4.500)

## 2013-10-31 LAB — MAGNESIUM: Magnesium: 1.8 mg/dL (ref 1.5–2.5)

## 2013-10-31 LAB — INSULIN, FASTING: Insulin fasting, serum: 15 u[IU]/mL (ref 3–28)

## 2013-12-28 ENCOUNTER — Other Ambulatory Visit: Payer: Self-pay | Admitting: Internal Medicine

## 2014-01-28 ENCOUNTER — Encounter: Payer: Self-pay | Admitting: Physician Assistant

## 2014-01-28 ENCOUNTER — Ambulatory Visit (INDEPENDENT_AMBULATORY_CARE_PROVIDER_SITE_OTHER): Payer: Private Health Insurance - Indemnity | Admitting: Physician Assistant

## 2014-01-28 ENCOUNTER — Ambulatory Visit: Payer: Self-pay | Admitting: Physician Assistant

## 2014-01-28 VITALS — BP 102/78 | HR 68 | Temp 98.1°F | Resp 16 | Ht 67.5 in | Wt 200.0 lb

## 2014-01-28 DIAGNOSIS — R209 Unspecified disturbances of skin sensation: Secondary | ICD-10-CM

## 2014-01-28 DIAGNOSIS — R202 Paresthesia of skin: Secondary | ICD-10-CM

## 2014-01-28 DIAGNOSIS — M791 Myalgia, unspecified site: Secondary | ICD-10-CM

## 2014-01-28 DIAGNOSIS — IMO0001 Reserved for inherently not codable concepts without codable children: Secondary | ICD-10-CM

## 2014-01-28 DIAGNOSIS — R079 Chest pain, unspecified: Secondary | ICD-10-CM

## 2014-01-28 DIAGNOSIS — M545 Low back pain, unspecified: Secondary | ICD-10-CM

## 2014-01-28 LAB — CBC WITH DIFFERENTIAL/PLATELET
Basophils Absolute: 0 10*3/uL (ref 0.0–0.1)
Basophils Relative: 0 % (ref 0–1)
EOS PCT: 1 % (ref 0–5)
Eosinophils Absolute: 0.1 10*3/uL (ref 0.0–0.7)
HEMATOCRIT: 39.9 % (ref 36.0–46.0)
Hemoglobin: 13.5 g/dL (ref 12.0–15.0)
LYMPHS ABS: 2.5 10*3/uL (ref 0.7–4.0)
Lymphocytes Relative: 32 % (ref 12–46)
MCH: 26.9 pg (ref 26.0–34.0)
MCHC: 33.8 g/dL (ref 30.0–36.0)
MCV: 79.6 fL (ref 78.0–100.0)
MONO ABS: 0.6 10*3/uL (ref 0.1–1.0)
Monocytes Relative: 8 % (ref 3–12)
Neutro Abs: 4.5 10*3/uL (ref 1.7–7.7)
Neutrophils Relative %: 59 % (ref 43–77)
Platelets: 220 10*3/uL (ref 150–400)
RBC: 5.01 MIL/uL (ref 3.87–5.11)
RDW: 15 % (ref 11.5–15.5)
WBC: 7.7 10*3/uL (ref 4.0–10.5)

## 2014-01-28 NOTE — Progress Notes (Signed)
HPI 48 y.o.female with HTN, hypokalemia history presents for bilateral leg pain. She was out in [place] visiting family, when she first got there she had some calf pain but then when she got home the leg pain was worse. Aleve does help the pain. Worse at night/when she is at rest, better with walking. She describes it as a cramp in her medial calf, feels spasms in her calf and then it goes up her back leg to her thigh, it is both legs. She also complains of some new muscle fatigue with her legs. She has some back pain but very minimal. Denies bladder/bowel problems, numbness, tingling, weakness, fever, chills.   Past Medical History  Diagnosis Date  . Hypertension   . Diabetes mellitus without complication   . Adrenal adenoma   . Hyperaldosteronism     Possibly  . Hypokalemia   . Hyperlipidemia   . Allergy   . Anemia      Allergies  Allergen Reactions  . Ace Inhibitors Cough  . Hctz [Hydrochlorothiazide]     Current Outpatient Prescriptions on File Prior to Visit  Medication Sig Dispense Refill  . aspirin 81 MG tablet Take 81 mg by mouth daily.      . Canagliflozin 300 MG TABS Take 1 tablet (300 mg total) by mouth daily.  30 tablet  11  . carvedilol (COREG) 6.25 MG tablet Take 1 tablet (6.25 mg total) by mouth 2 (two) times daily with a meal.  180 tablet  3  . Cholecalciferol (VITAMIN D PO) Take by mouth daily.      Marland Kitchen losartan (COZAAR) 100 MG tablet Take 100 mg by mouth daily.      . metFORMIN (GLUCOPHAGE-XR) 500 MG 24 hr tablet Take 500 mg by mouth daily. Take 2 tablets twice daily      . spironolactone (ALDACTONE) 100 MG tablet TAKE 1 TABLET TWICE DAILY  180 tablet  1  . topiramate (TOPAMAX) 25 MG tablet Take 25 mg by mouth 2 (two) times daily.       No current facility-administered medications on file prior to visit.    ROS: all negative expect above.   Physical: Filed Weights   01/28/14 1638  Weight: 200 lb (90.719 kg)   BP 102/78  Pulse 68  Temp(Src) 98.1 F (36.7  C)  Resp 16  Ht 5' 7.5" (1.715 m)  Wt 200 lb (90.719 kg)  BMI 30.84 kg/m2 General Appearance: Well nourished, in no apparent distress. Eyes: PERRLA, EOMs. Sinuses: No Frontal/maxillary tenderness ENT/Mouth: Ext aud canals clear, normal light reflex with TMs without erythema, bulging. Post pharynx without erythema, swelling, exudate.  Respiratory: CTAB Cardio: RRR, no murmurs, rubs or gallops. Peripheral pulses brisk and equal bilaterally, without edema. No aortic or femoral bruits. Abdomen: Soft, with bowl sounds. Nontender, no guarding, rebound. Lymphatics: Non tender without lymphadenopathy.  Musculoskeletal: Full ROM all peripheral extremities, 5/5 strength, and normal gait. Skin: Warm, dry without rashes, lesions, ecchymosis.  Neuro: Cranial nerves intact, reflexes slightly decreased bilateral legs with normal muscle tone, no cerebellar symptoms. Sensation intact.  Psych: Awake and oriented X 3, normal affect, Insight and Judgment appropriate.   Assessment and Plan: Bilateral leg pain- normal pulses, good sensation, mild decreased bilateral reflexes- Will get an Xray of lumbar area for possible OA/stenosis- may need an MRI Check electrolytes she has history of hypokalemia, on invokana which is new Recent travel, low risk PE/DVT will get Ddimer I

## 2014-01-28 NOTE — Patient Instructions (Signed)
Leg Cramps Leg cramps that occur during exercise can be caused by poor circulation or dehydration. However, muscle cramps that occur at rest or during the night are usually not due to any serious medical problem. Heat cramps may cause muscle spasms during hot weather.  CAUSES There is no clear cause for muscle cramps. However, dehydration may be a factor for those who do not drink enough fluids and those who exercise in the heat. Imbalances in the level of sodium, potassium, calcium or magnesium in the muscle tissue may also be a factor. Some medications, such as water pills (diuretics), may cause loss of chemicals that the body needs (like sodium and potassium) and cause muscle cramps. TREATMENT   Make sure your diet has enough fluids and essential minerals for the muscle to work normally.  Avoid strenuous exercise for several days if you have been having frequent leg cramps.  Stretch and massage the cramped muscle for several minutes.  Some medicines may be helpful in some patients with night cramps. Only take over-the-counter or prescription medicines as directed by your caregiver. SEEK IMMEDIATE MEDICAL CARE IF:   Your leg cramps become worse.  Your foot becomes cold, numb, or blue. Document Released: 06/29/2004 Document Revised: 08/14/2011 Document Reviewed: 06/16/2008 [facility] Patient Information 2015 [place], [place]. This information is not intended to replace advice given to you by your health care provider. Make sure you discuss any questions you have with your health care provider.  Sciatica Sciatica is pain, weakness, numbness, or tingling along the path of the sciatic nerve. The nerve starts in the lower back and runs down the back of each leg. The nerve controls the muscles in the lower leg and in the back of the knee, while also providing sensation to the back of the thigh, lower leg, and the sole of your foot. Sciatica is a symptom of another medical condition. For instance,  nerve damage or certain conditions, such as a herniated disk or bone spur on the spine, pinch or put pressure on the sciatic nerve. This causes the pain, weakness, or other sensations normally associated with sciatica. Generally, sciatica only affects one side of the body. CAUSES   Herniated or slipped disc.  Degenerative disk disease.  A pain disorder involving the narrow muscle in the buttocks (piriformis syndrome).  Pelvic injury or fracture.  Pregnancy.  Tumor (rare). SYMPTOMS  Symptoms can vary from mild to very severe. The symptoms usually travel from the low back to the buttocks and down the back of the leg. Symptoms can include:  Mild tingling or dull aches in the lower back, leg, or hip.  Numbness in the back of the calf or sole of the foot.  Burning sensations in the lower back, leg, or hip.  Sharp pains in the lower back, leg, or hip.  Leg weakness.  Severe back pain inhibiting movement. These symptoms may get worse with coughing, sneezing, laughing, or prolonged sitting or standing. Also, being overweight may worsen symptoms. DIAGNOSIS  Your caregiver will perform a physical exam to look for common symptoms of sciatica. He or she may ask you to do certain movements or activities that would trigger sciatic nerve pain. Other tests may be performed to find the cause of the sciatica. These may include:  Blood tests.  X-rays.  Imaging tests, such as an MRI or CT scan. TREATMENT  Treatment is directed at the cause of the sciatic pain. Sometimes, treatment is not necessary and the pain and discomfort goes away on  its own. If treatment is needed, your caregiver may suggest:  Over-the-counter medicines to relieve pain.  Prescription medicines, such as anti-inflammatory medicine, muscle relaxants, or narcotics.  Applying heat or ice to the painful area.  Steroid injections to lessen pain, irritation, and inflammation around the nerve.  Reducing activity during  periods of pain.  Exercising and stretching to strengthen your abdomen and improve flexibility of your spine. Your caregiver may suggest losing weight if the extra weight makes the back pain worse.  Physical therapy.  Surgery to eliminate what is pressing or pinching the nerve, such as a bone spur or part of a herniated disk. HOME CARE INSTRUCTIONS   Only take over-the-counter or prescription medicines for pain or discomfort as directed by your caregiver.  Apply ice to the affected area for 20 minutes, 3-4 times a day for the first 48-72 hours. Then try heat in the same way.  Exercise, stretch, or perform your usual activities if these do not aggravate your pain.  Attend physical therapy sessions as directed by your caregiver.  Keep all follow-up appointments as directed by your caregiver.  Do not wear high heels or shoes that do not provide proper support.  Check your mattress to see if it is too soft. A firm mattress may lessen your pain and discomfort. SEEK IMMEDIATE MEDICAL CARE IF:   You lose control of your bowel or bladder (incontinence).  You have increasing weakness in the lower back, pelvis, buttocks, or legs.  You have redness or swelling of your back.  You have a burning sensation when you urinate.  You have pain that gets worse when you lie down or awakens you at night.  Your pain is worse than you have experienced in the past.  Your pain is lasting longer than 4 weeks.  You are suddenly losing weight without reason. MAKE SURE YOU:  Understand these instructions.  Will watch your condition.  Will get help right away if you are not doing well or get worse. Document Released: 05/16/2001 Document Revised: 11/21/2011 Document Reviewed: 10/01/2011 [facility] Patient Information 2015 [place], [place]. This information is not intended to replace advice given to you by your health care provider. Make sure you discuss any questions you have with your health care  provider.

## 2014-01-29 LAB — MAGNESIUM: Magnesium: 2.1 mg/dL (ref 1.5–2.5)

## 2014-01-29 LAB — HEPATIC FUNCTION PANEL
ALBUMIN: 4.8 g/dL (ref 3.5–5.2)
ALK PHOS: 72 U/L (ref 39–117)
ALT: 12 U/L (ref 0–35)
AST: 12 U/L (ref 0–37)
BILIRUBIN TOTAL: 0.4 mg/dL (ref 0.2–1.2)
Bilirubin, Direct: 0.1 mg/dL (ref 0.0–0.3)
Indirect Bilirubin: 0.3 mg/dL (ref 0.2–1.2)
TOTAL PROTEIN: 7.9 g/dL (ref 6.0–8.3)

## 2014-01-29 LAB — BASIC METABOLIC PANEL WITH GFR
BUN: 19 mg/dL (ref 6–23)
CHLORIDE: 101 meq/L (ref 96–112)
CO2: 25 mEq/L (ref 19–32)
CREATININE: 0.98 mg/dL (ref 0.50–1.10)
Calcium: 9.7 mg/dL (ref 8.4–10.5)
GFR, Est African American: 79 mL/min
GFR, Est Non African American: 68 mL/min
Glucose, Bld: 146 mg/dL — ABNORMAL HIGH (ref 70–99)
Potassium: 4.3 mEq/L (ref 3.5–5.3)
Sodium: 136 mEq/L (ref 135–145)

## 2014-01-29 LAB — IRON AND TIBC
%SAT: 17 % — ABNORMAL LOW (ref 20–55)
Iron: 65 ug/dL (ref 42–145)
TIBC: 378 ug/dL (ref 250–470)
UIBC: 313 ug/dL (ref 125–400)

## 2014-01-29 LAB — FERRITIN: Ferritin: 43 ng/mL (ref 10–291)

## 2014-01-29 LAB — CK: Total CK: 72 U/L (ref 7–177)

## 2014-01-29 LAB — VITAMIN B12: VITAMIN B 12: 494 pg/mL (ref 211–911)

## 2014-01-29 LAB — D-DIMER, QUANTITATIVE: D-Dimer, Quant: 0.4 ug/mL-FEU (ref 0.00–0.48)

## 2014-02-03 ENCOUNTER — Ambulatory Visit: Payer: Self-pay | Admitting: Physician Assistant

## 2014-04-14 ENCOUNTER — Other Ambulatory Visit: Payer: Self-pay

## 2014-04-14 DIAGNOSIS — Z1231 Encounter for screening mammogram for malignant neoplasm of breast: Secondary | ICD-10-CM

## 2014-05-11 ENCOUNTER — Ambulatory Visit
Admission: RE | Admit: 2014-05-11 | Discharge: 2014-05-11 | Disposition: A | Discharge status: Home / Self Care | Payer: 59 | Source: Ambulatory Visit

## 2014-05-11 DIAGNOSIS — Z1231 Encounter for screening mammogram for malignant neoplasm of breast: Secondary | ICD-10-CM

## 2014-05-12 ENCOUNTER — Encounter: Payer: Self-pay | Admitting: Physician Assistant

## 2014-05-18 ENCOUNTER — Ambulatory Visit (INDEPENDENT_AMBULATORY_CARE_PROVIDER_SITE_OTHER): Payer: Private Health Insurance - Indemnity | Admitting: Physician Assistant

## 2014-05-18 ENCOUNTER — Encounter: Payer: Self-pay | Admitting: Physician Assistant

## 2014-05-18 VITALS — BP 128/72 | HR 72 | Temp 97.9°F | Resp 16 | Ht 67.5 in | Wt 209.0 lb

## 2014-05-18 DIAGNOSIS — R202 Paresthesia of skin: Secondary | ICD-10-CM

## 2014-05-18 DIAGNOSIS — M791 Myalgia, unspecified site: Secondary | ICD-10-CM

## 2014-05-18 DIAGNOSIS — R5383 Other fatigue: Secondary | ICD-10-CM

## 2014-05-18 DIAGNOSIS — R1013 Epigastric pain: Secondary | ICD-10-CM

## 2014-05-18 DIAGNOSIS — N3000 Acute cystitis without hematuria: Secondary | ICD-10-CM

## 2014-05-18 DIAGNOSIS — K2100 Gastro-esophageal reflux disease with esophagitis, without bleeding: Secondary | ICD-10-CM

## 2014-05-18 DIAGNOSIS — Z1212 Encounter for screening for malignant neoplasm of rectum: Secondary | ICD-10-CM

## 2014-05-18 LAB — HEPATIC FUNCTION PANEL
ALT: 14 U/L (ref 0–35)
AST: 13 U/L (ref 0–37)
Albumin: 4.3 g/dL (ref 3.5–5.2)
Alkaline Phosphatase: 67 U/L (ref 39–117)
BILIRUBIN DIRECT: 0.1 mg/dL (ref 0.0–0.3)
BILIRUBIN INDIRECT: 0.2 mg/dL (ref 0.2–1.2)
TOTAL PROTEIN: 7.4 g/dL (ref 6.0–8.3)
Total Bilirubin: 0.3 mg/dL (ref 0.2–1.2)

## 2014-05-18 LAB — CBC WITH DIFFERENTIAL/PLATELET
Basophils Absolute: 0 10*3/uL (ref 0.0–0.1)
Basophils Relative: 0 % (ref 0–1)
EOS PCT: 1 % (ref 0–5)
Eosinophils Absolute: 0.1 10*3/uL (ref 0.0–0.7)
HCT: 39.7 % (ref 36.0–46.0)
HEMOGLOBIN: 12.9 g/dL (ref 12.0–15.0)
LYMPHS PCT: 29 % (ref 12–46)
Lymphs Abs: 2.3 10*3/uL (ref 0.7–4.0)
MCH: 26.5 pg (ref 26.0–34.0)
MCHC: 32.5 g/dL (ref 30.0–36.0)
MCV: 81.5 fL (ref 78.0–100.0)
MONO ABS: 0.6 10*3/uL (ref 0.1–1.0)
MPV: 10.4 fL (ref 9.4–12.4)
Monocytes Relative: 7 % (ref 3–12)
Neutro Abs: 5 10*3/uL (ref 1.7–7.7)
Neutrophils Relative %: 63 % (ref 43–77)
PLATELETS: 207 10*3/uL (ref 150–400)
RBC: 4.87 MIL/uL (ref 3.87–5.11)
RDW: 14.1 % (ref 11.5–15.5)
WBC: 7.9 10*3/uL (ref 4.0–10.5)

## 2014-05-18 LAB — BASIC METABOLIC PANEL WITH GFR
BUN: 11 mg/dL (ref 6–23)
CHLORIDE: 100 meq/L (ref 96–112)
CO2: 26 meq/L (ref 19–32)
CREATININE: 0.83 mg/dL (ref 0.50–1.10)
Calcium: 10 mg/dL (ref 8.4–10.5)
GFR, EST NON AFRICAN AMERICAN: 83 mL/min
GFR, Est African American: >89 mL/min
Glucose, Bld: 157 mg/dL — ABNORMAL HIGH (ref 70–99)
POTASSIUM: 3.9 meq/L (ref 3.5–5.3)
Sodium: 135 mEq/L (ref 135–145)

## 2014-05-18 LAB — CK: CK TOTAL: 73 U/L (ref 7–177)

## 2014-05-18 LAB — IRON AND TIBC
%SAT: 20 % (ref 20–55)
IRON: 65 ug/dL (ref 42–145)
TIBC: 332 ug/dL (ref 250–470)
UIBC: 267 ug/dL (ref 125–400)

## 2014-05-18 LAB — LIPID PANEL
Cholesterol: 195 mg/dL (ref 0–200)
HDL: 63 mg/dL (ref >39)
LDL CALC: 114 mg/dL — AB (ref 0–99)
Total CHOL/HDL Ratio: 3.1 Ratio
Triglycerides: 92 mg/dL (ref <150)
VLDL: 18 mg/dL (ref 0–40)

## 2014-05-18 LAB — LIPASE: Lipase: 30 U/L (ref 0–75)

## 2014-05-18 LAB — MAGNESIUM: MAGNESIUM: 1.6 mg/dL (ref 1.5–2.5)

## 2014-05-18 LAB — AMYLASE: AMYLASE: 67 U/L (ref 0–105)

## 2014-05-18 MED ORDER — PANTOPRAZOLE SODIUM 40 MG PO TBEC: 40.0000 mg | DELAYED_RELEASE_TABLET | Freq: Every day | ORAL | Status: DC

## 2014-05-18 NOTE — Progress Notes (Signed)
Subjective:    Patient ID: Kristin Hess, female    DOB: [birth year], 48 y.o.   MRN: [id number]  HPI 48 y.o. female presents with constipation and different color of her stool. She has had bloating in her stomach, stool is pale yellow for last few days. She has had weight gain, she states she feels full all the time but continues to eat and have weight gain. She has not had a colonoscopy. Denies fever, chills. She was started on invokana however stopped it due to stomach pain however this was discontinued and it has continued. She has dull epigastric pain with nausea. She does have a lot of GERD/beltching. She has been taking 1 NSAID a day for last 2-3 days.  She has a history of hypokalemia, was put on spirolactone which has helped but has been complaining of leg pain for last 3 weeks.  She has had headaches intermittently and starting to come back.  She had vaginal [place] in 2014 that showed 2 simples cyst on right ovary, she had a 1 year follow that showed increased number and complex cyst at her OB/GYN, her CA 125 was elevated and she is following with them. She started on a BCP to try to shrink the cyst.  Vitamin D started 50,000 IU once a week for 12 weeks due to deficiency.    Review of Systems  Constitutional: Positive for appetite change and fatigue. Negative for fever, chills, diaphoresis, activity change and unexpected weight change.  HENT: Negative for trouble swallowing and voice change.   Respiratory: Negative.   Cardiovascular: Negative.   Gastrointestinal: Positive for nausea, abdominal pain, constipation and abdominal distention.  Genitourinary: Negative.   Musculoskeletal: Positive for myalgias (legs at night) and arthralgias. Negative for back pain, joint swelling, gait problem, neck pain and neck stiffness.  Skin: Negative.   Neurological: Positive for numbness (bilateral legs) and headaches (occipital). Negative for dizziness, tremors, seizures, syncope, facial asymmetry,  speech difficulty, weakness and light-headedness.  Hematological: Negative for adenopathy. Does not bruise/bleed easily.  Psychiatric/Behavioral: Negative.  Negative for sleep disturbance.       Objective:   Physical Exam  Constitutional: She is oriented to person, place, and time. She appears well-developed and well-nourished.  HENT:  Head: Normocephalic and atraumatic.  Right Ear: External ear normal.  Left Ear: External ear normal.  Mouth/Throat: Oropharynx is clear and moist.  + TMJ  Eyes: Conjunctivae and EOM are normal. Pupils are equal, round, and reactive to light.  Neck: Normal range of motion. Neck supple. No thyromegaly present.  Fatty supracalvicular  Cardiovascular: Normal rate, regular rhythm and normal heart sounds.  Exam reveals no gallop and no friction rub.   No murmur heard. Pulmonary/Chest: Effort normal and breath sounds normal. No respiratory distress. She has no wheezes.  Abdominal: Soft. Bowel sounds are normal. She exhibits distension (epigastric). She exhibits no mass. There is tenderness. There is no rebound and no guarding.  Musculoskeletal: Normal range of motion.  Lymphadenopathy:    She has no cervical adenopathy.  Neurological: She is alert and oriented to person, place, and time. She displays normal reflexes. No cranial nerve deficit. Coordination normal.  Skin: Skin is warm and dry.  Psychiatric: She has a normal mood and affect.       Assessment & Plan:  Epigastric pain:? infection, ulcer ,GB, pancreatitis-  check labs, bland diet, small portions, increase H20, PPI if pain continues will return for abdominal U/S.  Patient advised to go to the ER  if the symptoms increase or worsen.    Constipation- Increase fiber/ water intake, decrease caffeine, increase activity level, can add Miralax or Benefiber QD until BM soft. Please go to the hospital if you have severe abdominal pain, vomiting, fever, CP, SOB. Will get Hemoccult card.   Myalgias-  increase fluids, will check CPK, aldolase, TSH, ESR, magnesium, potassium, denies recent tick exposure.   Fatigue- check labs, increase activity and H2O

## 2014-05-18 NOTE — Patient Instructions (Signed)
What is the TMJ? The temporomandibular (tem-PUH-ro-man-DIB-yoo-ler) joint, or the TMJ, connects the upper and lower jawbones. This joint allows the jaw to open wide and move back and forth when you chew, talk, or yawn.There are also several muscles that help this joint move. There can be muscle tightness and pain in the muscle that can cause several symptoms.  What causes TMJ pain? There are many causes of TMJ pain. Repeated chewing (for example, chewing gum) and clenching your teeth can cause pain in the joint. Some TMJ pain has no obvious cause. What can I do to ease the pain? There are many things you can do to help your pain get better. When you have pain:  Eat soft foods and stay away from chewy foods (for example, taffy) Try to use both sides of your mouth to chew Don't chew gum Massage Don't open your mouth wide (for example, during yawning or singing) Don't bite your cheeks or fingernails Lower your amount of stress and worry Applying a warm, damp washcloth to the joint may help. Over-the-counter pain medicines such as ibuprofen (one brand: Advil) or acetaminophen (one brand: Tylenol) might also help. Do not use these medicines if you are allergic to them or if your doctor told you not to use them. How can I stop the pain from coming back? When your pain is better, you can do these exercises to make your muscles stronger and to keep the pain from coming back:  Resisted mouth opening: Place your thumb or two fingers under your chin and open your mouth slowly, pushing up lightly on your chin with your thumb. Hold for three to six seconds. Close your mouth slowly. Resisted mouth closing: Place your thumbs under your chin and your two index fingers on the ridge between your mouth and the bottom of your chin. Push down lightly on your chin as you close your mouth. Tongue up: Slowly open and close your mouth while keeping the tongue touching the roof of the mouth. Side-to-side jaw movement:  Place an object about one fourth of an inch thick (for example, two tongue depressors) between your front teeth. Slowly move your jaw from side to side. Increase the thickness of the object as the exercise becomes easier Forward jaw movement: Place an object about one fourth of an inch thick between your front teeth and move the bottom jaw forward so that the bottom teeth are in front of the top teeth. Increase the thickness of the object as the exercise becomes easier. These exercises should not be painful. If it hurts to do these exercises, stop doing them and talk to your family doctor.    Take the nexium/protonix for 2-4 more weeks once a day, then switch to pepcid or zantac (generic is fine) 2 x a day for 2 weeks, then once a day for 2 weeks and then stop. Avoid alcohol, spicy foods, NSAIDS (aleve, ibuprofen) at this time. See foods below.   Food Choices for Gastroesophageal Reflux Disease When you have gastroesophageal reflux disease (GERD), the foods you eat and your eating habits are very important. Choosing the right foods can help ease the discomfort of GERD. WHAT GENERAL GUIDELINES DO I NEED TO FOLLOW?  Choose fruits, vegetables, whole grains, low-fat dairy products, and low-fat meat, fish, and poultry.  Limit fats such as oils, salad dressings, butter, nuts, and avocado.  Keep a food diary to identify foods that cause symptoms.  Avoid foods that cause reflux. These may be different for different  people.  Eat frequent small meals instead of three large meals each day.  Eat your meals slowly, in a relaxed setting.  Limit fried foods.  Cook foods using methods other than frying.  Avoid drinking alcohol.  Avoid drinking large amounts of liquids with your meals.  Avoid bending over or lying down until 2-3 hours after eating. WHAT FOODS ARE NOT RECOMMENDED? The following are some foods and drinks that may worsen your symptoms: Vegetables Tomatoes. Tomato juice. Tomato and  spaghetti sauce. Chili peppers. Onion and garlic. Horseradish. Fruits Oranges, grapefruit, and lemon (fruit and juice). Meats High-fat meats, fish, and poultry. This includes hot dogs, ribs, ham, sausage, salami, and bacon. Dairy Whole milk and chocolate milk. Sour cream. Cream. Butter. Ice cream. Cream cheese.  Beverages Coffee and tea, with or without caffeine. Carbonated beverages or energy drinks. Condiments Hot sauce. Barbecue sauce.  Sweets/Desserts Chocolate and cocoa. Donuts. Peppermint and spearmint. Fats and Oils High-fat foods, including [place] fries and potato chips. Other Vinegar. Strong spices, such as black pepper, white pepper, red pepper, cayenne, curry powder, cloves, ginger, and chili powder.

## 2014-05-19 LAB — ANCA SCREEN W REFLEX TITER
ATYPICAL P-ANCA SCREEN: NEGATIVE
c-ANCA Screen: NEGATIVE
p-ANCA Screen: NEGATIVE

## 2014-05-19 LAB — CORTISOL: Cortisol, Plasma: 4.6 ug/dL

## 2014-05-19 LAB — VITAMIN B12: Vitamin B-12: 523 pg/mL (ref 211–911)

## 2014-05-19 LAB — SEDIMENTATION RATE: Sed Rate: 5 mm/hr (ref 0–22)

## 2014-05-19 LAB — FOLATE RBC: RBC Folate: 744 ng/mL (ref >280)

## 2014-05-19 LAB — FERRITIN: Ferritin: 59 ng/mL (ref 10–291)

## 2014-05-19 LAB — TSH: TSH: 2.115 u[IU]/mL (ref 0.350–4.500)

## 2014-05-20 LAB — ACTH: C206 ACTH: 16 pg/mL (ref 6–50)

## 2014-05-20 LAB — HELICOBACTER PYLORI ABS-IGG+IGA, BLD
H Pylori IgG: 0.55 {ISR}
HELICOBACTER PYLORI AB, IGA: 0.3 U/mL (ref <9.0)

## 2014-05-25 ENCOUNTER — Other Ambulatory Visit: Payer: Self-pay | Admitting: Obstetrics and Gynecology

## 2014-05-25 DIAGNOSIS — R971 Elevated cancer antigen 125 [CA 125]: Secondary | ICD-10-CM

## 2014-05-25 DIAGNOSIS — N83299 Other ovarian cyst, unspecified side: Secondary | ICD-10-CM

## 2014-05-26 ENCOUNTER — Ambulatory Visit
Admission: RE | Admit: 2014-05-26 | Discharge: 2014-05-26 | Disposition: A | Discharge status: Home / Self Care | Payer: 59 | Source: Ambulatory Visit | Attending: Obstetrics and Gynecology | Admitting: Obstetrics and Gynecology

## 2014-05-26 DIAGNOSIS — N83299 Other ovarian cyst, unspecified side: Secondary | ICD-10-CM

## 2014-05-26 DIAGNOSIS — R971 Elevated cancer antigen 125 [CA 125]: Secondary | ICD-10-CM

## 2014-05-26 MED ORDER — IOHEXOL 300 MG/ML  SOLN
125.0000 mL | Freq: Once | INTRAMUSCULAR | Status: AC | PRN
  Administered 2014-05-26: 125 mL via INTRAVENOUS

## 2014-05-27 ENCOUNTER — Ambulatory Visit: Payer: 59 | Attending: Gynecology | Admitting: Gynecology

## 2014-05-27 ENCOUNTER — Encounter: Payer: Self-pay | Admitting: Gynecology

## 2014-05-27 VITALS — BP 127/87 | HR 81 | Temp 98.5°F | Resp 20 | Ht 67.5 in | Wt 203.5 lb

## 2014-05-27 DIAGNOSIS — R140 Abdominal distension (gaseous): Secondary | ICD-10-CM | POA: Diagnosis not present

## 2014-05-27 DIAGNOSIS — D259 Leiomyoma of uterus, unspecified: Secondary | ICD-10-CM | POA: Insufficient documentation

## 2014-05-27 DIAGNOSIS — N8320 Unspecified ovarian cysts: Secondary | ICD-10-CM

## 2014-05-27 DIAGNOSIS — E785 Hyperlipidemia, unspecified: Secondary | ICD-10-CM | POA: Diagnosis not present

## 2014-05-27 DIAGNOSIS — R971 Elevated cancer antigen 125 [CA 125]: Secondary | ICD-10-CM | POA: Diagnosis present

## 2014-05-27 DIAGNOSIS — R978 Other abnormal tumor markers: Secondary | ICD-10-CM

## 2014-05-27 DIAGNOSIS — E119 Type 2 diabetes mellitus without complications: Secondary | ICD-10-CM | POA: Insufficient documentation

## 2014-05-27 DIAGNOSIS — I10 Essential (primary) hypertension: Secondary | ICD-10-CM | POA: Diagnosis not present

## 2014-05-27 DIAGNOSIS — N83299 Other ovarian cyst, unspecified side: Secondary | ICD-10-CM

## 2014-05-27 DIAGNOSIS — Z7982 Long term (current) use of aspirin: Secondary | ICD-10-CM | POA: Insufficient documentation

## 2014-05-27 DIAGNOSIS — D649 Anemia, unspecified: Secondary | ICD-10-CM | POA: Insufficient documentation

## 2014-05-27 DIAGNOSIS — Z79899 Other long term (current) drug therapy: Secondary | ICD-10-CM | POA: Insufficient documentation

## 2014-05-27 NOTE — Progress Notes (Signed)
Consult Note: Gyn-Onc   Kristin Hess 48 y.o. female  Chief Complaint  Patient presents with  . complex ovarian cyst  . elevated CA125    Assessment : Small ovarian cysts with some internal projections and elevated CA-125 andOVA1.  Plan: I reviewed the patient's laboratory work and imaging. The CT scan shows no other explanation for elevated tumor markers (such as liver or pancreatic disease). Given the elevation in the CA-125 and OVA1, we must rule out ovarian cancer. I would recommend the patient undergo bilateral salpingo-oophorectomy. Discussed the pros and cons of obtaining  intraoperative frozen section to guide any subsequent surgical staging versus allowing the pathologist to complete final report before making any further surgical recommendations. The patient would prefer to initially have bilateral salpingo-oophorectomy and await final report before making any further decisions regarding staging surgery should this be ovarian cancer.  The risks of surgery including hemorrhage infection injury to adjacent viscera thromboembolic, conditions were discussed with the patient and her husband. She also understands that bilateral salpingo-oophorectomy will result in menopause and potential menopausal symptoms.  They understand that Dr. Everitt Amber will be the primary surgeon and we will plan on performing this through minimally invasive techniques. Surgery is scheduled for 06/16/2014 and the patient will see Dr. Denman George preoperatively to answer any further questions.  HPI: 48 year old Afro-American female gravida 2 seen in consultation at the request of Dr. Everett Graff regarding management of an ovarian cyst and an elevated CA-125 and OVA1. The patient reports she had an ovarian cyst found at the time of IUD removal approximately a year ago. A follow-up ultrasound on 05/07/2014 of the uterus with a 1 cm uterine fibroid and a 3 x 2.4 x 2.4 cm cystic mass with fine internal echoes in the left  ovary and a right ovary with a 3.8 x 2.6 x 2.7 cm mildly complex cyst with internal echoes and wall projections. There is no free pelvic fluid. CA-125 value was 278 units per mL and OVA1 was 8.9. Statistically, based on the OVA1 result, the patient has approximate 70% chance of having ovarian cancer. She subsequent had a CT scan that is essentially normal. The patient does note some low-grade pelvic discomfort and bloating for the past several months. She has no other constitutional symptoms.  The patient is had 2 prior cesarean sections but no other abdominal or pelvic surgery. She has a great grandmother who had ovarian cancer. However there were no other women in the family with breast ovarian endometrial or colon cancer.  Review of Systems:10 point review of systems is negative except as noted in interval history.   Vitals: Blood pressure 127/87, pulse 81, temperature 98.5 F (36.9 C), temperature source Oral, resp. rate 20, height 5' 7.5" (1.715 m), weight 203 lb 8 oz (92.307 kg).  Physical Exam: General : The patient is a healthy woman in no acute distress.  HEENT: normocephalic, extraoccular movements normal; neck is supple without thyromegally  Lynphnodes: Supraclavicular and inguinal nodes not enlarged  Abdomen: Soft, non-tender, no ascites, no organomegally, no masses, no hernias  Pelvic:  EGBUS: Normal female  Vagina: Normal, no lesions  Urethra and Bladder: Normal, non-tender  Cervix: Normal there are no lesions Uterus: Anterior, normal shape size and consistency Bi-manual examination: Non-tender; no adenxal masses or nodularity  Rectal: normal sphincter tone, no masses, no blood  Lower extremities: No edema or varicosities. Normal range of motion      Allergies  Allergen Reactions  . Ace Inhibitors Cough  .  Hctz [Hydrochlorothiazide]     Past Medical History  Diagnosis Date  . Hypertension   . Diabetes mellitus without complication   . Adrenal adenoma   .  Hyperaldosteronism     Possibly  . Hypokalemia   . Hyperlipidemia   . Allergy   . Anemia     History reviewed. No pertinent past surgical history.  Current Outpatient Prescriptions  Medication Sig Dispense Refill  . aspirin 81 MG tablet Take 81 mg by mouth daily.    Marland Kitchen CAMILA 0.35 MG tablet Take 1 tablet by mouth daily.     . carvedilol (COREG) 6.25 MG tablet Take 1 tablet (6.25 mg total) by mouth 2 (two) times daily with a meal. 180 tablet 3  . Cholecalciferol (VITAMIN D PO) Take by mouth daily.    . Cyanocobalamin (B-12 PO) Take 1 tablet by mouth daily.    . Iron TABS Take 1 tablet by mouth daily.    Marland Kitchen losartan (COZAAR) 100 MG tablet Take 100 mg by mouth daily.    . pantoprazole (PROTONIX) 40 MG tablet Take 1 tablet (40 mg total) by mouth daily. 30 tablet 0  . spironolactone (ALDACTONE) 100 MG tablet TAKE 1 TABLET TWICE DAILY 180 tablet 1  . INVOKANA 300 MG TABS tablet 300 mg daily before breakfast.     . topiramate (TOPAMAX) 25 MG tablet Take 25 mg by mouth 2 (two) times daily.      No current facility-administered medications for this visit.    History   Social History  . Marital Status: Married    Spouse Name: N/A    Number of Children: N/A  . Years of Education: N/A   Occupational History  . Not on file.   Social History Main Topics  . Smoking status: Never Smoker   . Smokeless tobacco: Not on file  . Alcohol Use: No  . Drug Use: No  . Sexual Activity: Not on file   Other Topics Concern  . Not on file   Social History Narrative    Family History  Problem Relation Age of Onset  . Diabetes Father   . Hepatitis C Father   . Stroke Mother   . Diabetes Mother   . Stroke Brother       Alvino Chapel, MD 05/27/2014, 11:02 AM

## 2014-05-27 NOTE — Patient Instructions (Signed)
Preparing for your Surgery  Plan for surgery on January 12 with Dr. Denman George.  Pre-operative Testing -You will receive a phone call from presurgical testing at [facility] to arrange for a pre-operative testing appointment before your surgery.  This appointment normally occurs one to two weeks before your scheduled surgery.   -Bring your insurance card, copy of an advanced directive if applicable, medication list  -At that visit, you will be asked to sign a consent for a possible blood transfusion in case a transfusion becomes necessary during surgery.  The need for a blood transfusion is rare but having consent is a necessary part of your care.     -You should not be taking blood thinners or aspirin at least ten days prior to surgery unless instructed by your surgeon.  Day Before Surgery at [place] will be asked to take in only clear liquids the day before surgery.  Examples of clear liquids include broths, jello, and clear juices.  You will be advised to have nothing to eat or drink after midnight the evening before.    Your role in recovery Your role is to become active as soon as directed by your doctor, while still giving yourself time to heal.  Rest when you feel tired. You will be asked to do the following in order to speed your recovery:  - Cough and breathe deeply. This helps toclear and expand your lungs and can prevent pneumonia. You may be given a spirometer to practice deep breathing. A staff member will show you how to use the spirometer. - Do mild physical activity. Walking or moving your legs help your circulation and body functions return to normal. A staff member will help you when you try to walk and will provide you with simple exercises. Do not try to get up or walk alone the first time. - Actively manage your pain. Managing your pain lets you move in comfort. We will ask you to rate your pain on a scale of zero to 10. It is your responsibility to tell  your doctor or nurse where and how much you hurt so your pain can be treated.  Special Considerations -If you are diabetic, you may be placed on insulin after surgery to have closer control over your blood sugars to promote healing and recovery.  This does not mean that you will be discharged on insulin.  If applicable, your oral antidiabetics will be resumed when you are tolerating a solid diet.  -Your final pathology results from surgery should be available by the Friday after surgery and the results will be relayed to you when available.

## 2014-06-11 NOTE — Patient Instructions (Addendum)
TATYM SCHERMER  06/11/2014   Your procedure is scheduled on: 06/16/13   Report to [facility]  Entrance and follow signs to               [place] at 5:30  AM.   Call this number if you have problems the morning of surgery [phone]   Remember:  Do not eat food or drink liquids :After Midnight. NONE              CLEAR LIQUIDS ONLY ON DAY BEFORE SURGERY    CLEAR LIQUID DIET   Foods Allowed                                                                     Foods Excluded  Coffee and tea, regular and decaf                             liquids that you cannot  Plain Jell-O in any flavor                                             see through such as: Fruit ices (not with fruit pulp)                                     milk, soups, orange juice  Iced Popsicles                                    All solid food Carbonated beverages, regular and diet                                    Cranberry, grape and apple juices Sports drinks like Gatorade Lightly seasoned clear broth or consume(fat free) Sugar, honey syrup   _____________________________________________________________________       Take these medicines the morning of surgery with A SIP OF WATER:                                You may not have any metal on your body including hair pins and              piercings  Do not wear jewelry, make-up, lotions, powders or perfumes.             Do not wear nail polish.  Do not shave  48 hours prior to surgery.              Men may shave face and neck.   Do not bring valuables to the hospital. [place]  Contacts, dentures or bridgework may not be worn  into surgery.  Leave suitcase in the car. After surgery it may be brought to your room.     Patients discharged the day of surgery will not be allowed to drive home.  Name and phone number of your driver:  Special Instructions: N/A               Please read over the following fact sheets you were given: _____________________________________________________________________                                                     [place]  Before surgery, you can play an important role.  Because skin is not sterile, your skin needs to be as free of germs as possible.  You can reduce the number of germs on your skin by washing with CHG (chlorahexidine gluconate) soap before surgery.  CHG is an antiseptic cleaner which kills germs and bonds with the skin to continue killing germs even after washing. Please DO NOT use if you have an allergy to CHG or antibacterial soaps.  If your skin becomes reddened/irritated stop using the CHG and inform your nurse when you arrive at Short Stay. Do not shave (including legs and underarms) for at least 48 hours prior to the first CHG shower.  You may shave your face. Please follow these instructions carefully:   1.  Shower with CHG Soap the night before surgery and the  morning of Surgery.   2.  If you choose to wash your hair, wash your hair first as usual with your  normal  Shampoo.   3.  After you shampoo, rinse your hair and body thoroughly to remove the  shampoo.                                         4.  Use CHG as you would any other liquid soap.  You can apply chg directly  to the skin and wash . Gently wash with scrungie or clean wascloth    5.  Apply the CHG Soap to your body ONLY FROM THE NECK DOWN.   Do not use on open                           Wound or open sores. Avoid contact with eyes, ears mouth and genitals (private parts).                        Genitals (private parts) with your normal soap.              6.  Wash thoroughly, paying special attention to the area where your surgery  will be performed.   7.  Thoroughly rinse your body with warm water from the neck down.   8.  DO NOT shower/wash with your normal soap after using and rinsing off  the CHG  Soap .                9.  Pat yourself dry with a clean towel.             10.  Wear clean pajamas.  11.  Place clean sheets on your bed the night of your first shower and do not  sleep with pets.  Day of Surgery : Do not apply any lotions/deodorants the morning of surgery.  Please wear clean clothes to the hospital/surgery center.  FAILURE TO FOLLOW THESE INSTRUCTIONS MAY RESULT IN THE CANCELLATION OF YOUR SURGERY    PATIENT SIGNATURE_________________________________  ______________________________________________________________________     Adam Phenix  An incentive spirometer is a tool that can help keep your lungs clear and active. This tool measures how well you are filling your lungs with each breath. Taking long deep breaths may help reverse or decrease the chance of developing breathing (pulmonary) problems (especially infection) following:  A long period of time when you are unable to move or be active. BEFORE THE PROCEDURE   If the spirometer includes an indicator to show your best effort, your nurse or respiratory therapist will set it to a desired goal.  If possible, sit up straight or lean slightly forward. Try not to slouch.  Hold the incentive spirometer in an upright position. INSTRUCTIONS FOR USE   Sit on the edge of your bed if possible, or sit up as far as you can in bed or on a chair.  Hold the incentive spirometer in an upright position.  Breathe out normally.  Place the mouthpiece in your mouth and seal your lips tightly around it.  Breathe in slowly and as deeply as possible, raising the piston or the ball toward the top of the column.  Hold your breath for 3-5 seconds or for as long as possible. Allow the piston or ball to fall to the bottom of the column.  Remove the mouthpiece from your mouth and breathe out normally.  Rest for a few seconds and repeat Steps 1 through 7 at least 10 times every 1-2 hours when you are  awake. Take your time and take a few normal breaths between deep breaths.  The spirometer may include an indicator to show your best effort. Use the indicator as a goal to work toward during each repetition.  After each set of 10 deep breaths, practice coughing to be sure your lungs are clear. If you have an incision (the cut made at the time of surgery), support your incision when coughing by placing a pillow or rolled up towels firmly against it. Once you are able to get out of bed, walk around indoors and cough well. You may stop using the incentive spirometer when instructed by your caregiver.  RISKS AND COMPLICATIONS  Take your time so you do not get dizzy or light-headed.  If you are in pain, you may need to take or ask for pain medication before doing incentive spirometry. It is harder to take a deep breath if you are having pain. AFTER USE  Rest and breathe slowly and easily.  It can be helpful to keep track of a log of your progress. Your caregiver can provide you with a simple table to help with this. If you are using the spirometer at home, follow these instructions: [place] IF:   You are having difficultly using the spirometer.  You have trouble using the spirometer as often as instructed.  Your pain medication is not giving enough relief while using the spirometer.  You develop fever of 100.5 F (38.1 C) or higher. SEEK IMMEDIATE MEDICAL CARE IF:   You cough up bloody sputum that had not been present before.  You develop fever of 102 F (  38.9 C) or greater.  You develop worsening pain at or near the incision site. MAKE SURE YOU:   Understand these instructions.  Will watch your condition.  Will get help right away if you are not doing well or get worse. Document Released: 10/02/2006 Document Revised: 08/14/2011 Document Reviewed: 12/03/2006 ExitCare Patient Information 2014 ExitCare,  [place].   ________________________________________________________________________  WHAT IS A BLOOD TRANSFUSION? Blood Transfusion Information  A transfusion is the replacement of blood or some of its parts. Blood is made up of multiple cells which provide different functions.  Red blood cells carry oxygen and are used for blood loss replacement.  White blood cells fight against infection.  Platelets control bleeding.  Plasma helps clot blood.  Other blood products are available for specialized needs, such as hemophilia or other clotting disorders. BEFORE THE TRANSFUSION  Who gives blood for transfusions?   Healthy volunteers who are fully evaluated to make sure their blood is safe. This is blood bank blood. Transfusion therapy is the safest it has ever been in the practice of medicine. Before blood is taken from a donor, a complete history is taken to make sure that person has no history of diseases nor engages in risky social behavior (examples are intravenous drug use or sexual activity with multiple partners). The donor's travel history is screened to minimize risk of transmitting infections, such as malaria. The donated blood is tested for signs of infectious diseases, such as HIV and hepatitis. The blood is then tested to be sure it is compatible with you in order to minimize the chance of a transfusion reaction. If you or a relative donates blood, this is often done in anticipation of surgery and is not appropriate for emergency situations. It takes many days to process the donated blood. RISKS AND COMPLICATIONS Although transfusion therapy is very safe and saves many lives, the main dangers of transfusion include:   Getting an infectious disease.  Developing a transfusion reaction. This is an allergic reaction to something in the blood you were given. Every precaution is taken to prevent this. The decision to have a blood transfusion has been considered carefully by your caregiver  before blood is given. Blood is not given unless the benefits outweigh the risks. AFTER THE TRANSFUSION  Right after receiving a blood transfusion, you will usually feel much better and more energetic. This is especially true if your red blood cells have gotten low (anemic). The transfusion raises the level of the red blood cells which carry oxygen, and this usually causes an energy increase.  The nurse administering the transfusion will monitor you carefully for complications. HOME CARE INSTRUCTIONS  No special instructions are needed after a transfusion. You may find your energy is better. Speak with your caregiver about any limitations on activity for underlying diseases you may have. SEEK MEDICAL CARE IF:   Your condition is not improving after your transfusion.  You develop redness or irritation at the intravenous (IV) site. SEEK IMMEDIATE MEDICAL CARE IF:  Any of the following symptoms occur over the next 12 hours:  Shaking chills.  You have a temperature by mouth above 102 F (38.9 C), not controlled by medicine.  Chest, back, or muscle pain.  People around you feel you are not acting correctly or are confused.  Shortness of breath or difficulty breathing.  Dizziness and fainting.  You get a rash or develop hives.  You have a decrease in urine output.  Your urine turns a dark color or changes to  pink, red, or brown. Any of the following symptoms occur over the next 10 days:  You have a temperature by mouth above 102 F (38.9 C), not controlled by medicine.  Shortness of breath.  Weakness after normal activity.  The white part of the eye turns yellow (jaundice).  You have a decrease in the amount of urine or are urinating less often.  Your urine turns a dark color or changes to pink, red, or brown. Document Released: 05/19/2000 Document Revised: 08/14/2011 Document Reviewed: 01/06/2008 [facility] Patient Information 2014 [place],  [place].  _______________________________________________________________________

## 2014-06-12 ENCOUNTER — Encounter (HOSPITAL_COMMUNITY): Payer: Self-pay

## 2014-06-12 ENCOUNTER — Encounter (HOSPITAL_COMMUNITY)
Admission: RE | Admit: 2014-06-12 | Discharge: 2014-06-12 | Disposition: A | Discharge status: Home / Self Care | Payer: Managed Care, Other (non HMO) | Source: Ambulatory Visit | Attending: Gynecologic Oncology | Admitting: Gynecologic Oncology

## 2014-06-12 ENCOUNTER — Ambulatory Visit (HOSPITAL_COMMUNITY)
Admission: RE | Admit: 2014-06-12 | Discharge: 2014-06-12 | Disposition: A | Discharge status: Home / Self Care | Payer: Managed Care, Other (non HMO) | Source: Ambulatory Visit | Attending: Anesthesiology | Admitting: Anesthesiology

## 2014-06-12 ENCOUNTER — Ambulatory Visit: Payer: 59 | Attending: Gynecologic Oncology | Admitting: Gynecologic Oncology

## 2014-06-12 ENCOUNTER — Encounter: Payer: Self-pay | Admitting: Gynecologic Oncology

## 2014-06-12 VITALS — BP 143/92 | HR 95 | Temp 98.5°F | Resp 16 | Ht 67.5 in | Wt 203.4 lb

## 2014-06-12 DIAGNOSIS — E119 Type 2 diabetes mellitus without complications: Secondary | ICD-10-CM | POA: Diagnosis not present

## 2014-06-12 DIAGNOSIS — R971 Elevated cancer antigen 125 [CA 125]: Secondary | ICD-10-CM

## 2014-06-12 DIAGNOSIS — R978 Other abnormal tumor markers: Secondary | ICD-10-CM

## 2014-06-12 DIAGNOSIS — Z8041 Family history of malignant neoplasm of ovary: Secondary | ICD-10-CM

## 2014-06-12 DIAGNOSIS — I10 Essential (primary) hypertension: Secondary | ICD-10-CM | POA: Diagnosis not present

## 2014-06-12 DIAGNOSIS — N8320 Unspecified ovarian cysts: Secondary | ICD-10-CM | POA: Insufficient documentation

## 2014-06-12 DIAGNOSIS — Z79899 Other long term (current) drug therapy: Secondary | ICD-10-CM | POA: Diagnosis not present

## 2014-06-12 DIAGNOSIS — E785 Hyperlipidemia, unspecified: Secondary | ICD-10-CM | POA: Insufficient documentation

## 2014-06-12 DIAGNOSIS — Z7982 Long term (current) use of aspirin: Secondary | ICD-10-CM | POA: Diagnosis not present

## 2014-06-12 DIAGNOSIS — Z01818 Encounter for other preprocedural examination: Secondary | ICD-10-CM | POA: Diagnosis not present

## 2014-06-12 DIAGNOSIS — K219 Gastro-esophageal reflux disease without esophagitis: Secondary | ICD-10-CM | POA: Diagnosis not present

## 2014-06-12 DIAGNOSIS — D259 Leiomyoma of uterus, unspecified: Secondary | ICD-10-CM | POA: Insufficient documentation

## 2014-06-12 DIAGNOSIS — N83299 Other ovarian cyst, unspecified side: Secondary | ICD-10-CM

## 2014-06-12 HISTORY — DX: Gastro-esophageal reflux disease without esophagitis: K21.9

## 2014-06-12 HISTORY — DX: Unspecified ovarian cyst, unspecified side: N83.209

## 2014-06-12 HISTORY — DX: Headache, unspecified: R51.9

## 2014-06-12 HISTORY — DX: Headache: R51

## 2014-06-12 LAB — CBC WITH DIFFERENTIAL/PLATELET
BASOS PCT: 0 % (ref 0–1)
Basophils Absolute: 0 10*3/uL (ref 0.0–0.1)
EOS ABS: 0.1 10*3/uL (ref 0.0–0.7)
Eosinophils Relative: 1 % (ref 0–5)
HEMATOCRIT: 44 % (ref 36.0–46.0)
HEMOGLOBIN: 14.4 g/dL (ref 12.0–15.0)
LYMPHS ABS: 2.4 10*3/uL (ref 0.7–4.0)
LYMPHS PCT: 25 % (ref 12–46)
MCH: 27.5 pg (ref 26.0–34.0)
MCHC: 32.7 g/dL (ref 30.0–36.0)
MCV: 84 fL (ref 78.0–100.0)
MONOS PCT: 7 % (ref 3–12)
Monocytes Absolute: 0.6 10*3/uL (ref 0.1–1.0)
Neutro Abs: 6.3 10*3/uL (ref 1.7–7.7)
Neutrophils Relative %: 67 % (ref 43–77)
Platelets: 220 10*3/uL (ref 150–400)
RBC: 5.24 MIL/uL — ABNORMAL HIGH (ref 3.87–5.11)
RDW: 13.2 % (ref 11.5–15.5)
WBC: 9.3 10*3/uL (ref 4.0–10.5)

## 2014-06-12 LAB — COMPREHENSIVE METABOLIC PANEL
ALBUMIN: 4.9 g/dL (ref 3.5–5.2)
ALT: 21 U/L (ref 0–35)
AST: 19 U/L (ref 0–37)
Alkaline Phosphatase: 86 U/L (ref 39–117)
Anion gap: 7 (ref 5–15)
BILIRUBIN TOTAL: 0.7 mg/dL (ref 0.3–1.2)
BUN: 17 mg/dL (ref 6–23)
CHLORIDE: 103 meq/L (ref 96–112)
CO2: 24 mmol/L (ref 19–32)
CREATININE: 0.88 mg/dL (ref 0.50–1.10)
Calcium: 10.1 mg/dL (ref 8.4–10.5)
GFR calc Af Amer: 88 mL/min — ABNORMAL LOW (ref >90)
GFR calc non Af Amer: 76 mL/min — ABNORMAL LOW (ref >90)
GLUCOSE: 223 mg/dL — AB (ref 70–99)
POTASSIUM: 4.5 mmol/L (ref 3.5–5.1)
Sodium: 134 mmol/L — ABNORMAL LOW (ref 135–145)
TOTAL PROTEIN: 8.6 g/dL — AB (ref 6.0–8.3)

## 2014-06-12 LAB — PREGNANCY, URINE: PREG TEST UR: NEGATIVE

## 2014-06-12 LAB — URINALYSIS, ROUTINE W REFLEX MICROSCOPIC
Bilirubin Urine: NEGATIVE
KETONES UR: NEGATIVE mg/dL
Leukocytes, UA: NEGATIVE
Nitrite: NEGATIVE
PH: 5 (ref 5.0–8.0)
Protein, ur: NEGATIVE mg/dL
Specific Gravity, Urine: 1.03 (ref 1.005–1.030)
UROBILINOGEN UA: 0.2 mg/dL (ref 0.0–1.0)

## 2014-06-12 LAB — URINE MICROSCOPIC-ADD ON

## 2014-06-12 LAB — ABO/RH: ABO/RH(D): A POS

## 2014-06-12 NOTE — Patient Instructions (Signed)
Preparing for your Surgery  Plan for surgery on January 12 with Dr. Denman George.  Pre-operative Testing -You will receive a phone call from presurgical testing at [facility] to arrange for a pre-operative testing appointment before your surgery.  This appointment normally occurs one to two weeks before your scheduled surgery.   -Bring your insurance card, copy of an advanced directive if applicable, medication list  -At that visit, you will be asked to sign a consent for a possible blood transfusion in case a transfusion becomes necessary during surgery.  The need for a blood transfusion is rare but having consent is a necessary part of your care.     -You should not be taking blood thinners or aspirin at least ten days prior to surgery unless instructed by your surgeon.  Day Before Surgery at [facility] will be asked to take in only clear liquids the day before surgery.  Examples of clear liquids include broths, jello, and clear juices. You will be advised to have nothing to eat or drink after midnight the evening before.    Your role in recovery Your role is to become active as soon as directed by your doctor, while still giving yourself time to heal.  Rest when you feel tired. You will be asked to do the following in order to speed your recovery:  - Cough and breathe deeply. This helps toclear and expand your lungs and can prevent pneumonia. You may be given a spirometer to practice deep breathing. A staff member will show you how to use the spirometer. - Do mild physical activity. Walking or moving your legs help your circulation and body functions return to normal. A staff member will help you when you try to walk and will provide you with simple exercises. Do not try to get up or walk alone the first time. - Actively manage your pain. Managing your pain lets you move in comfort. We will ask you to rate your pain on a scale of zero to 10. It is your responsibility to tell  your doctor or nurse where and how much you hurt so your pain can be treated.  Special Considerations -If you are diabetic, you may be placed on insulin after surgery to have closer control over your blood sugars to promote healing and recovery.  This does not mean that you will be discharged on insulin.  If applicable, your oral antidiabetics will be resumed when you are tolerating a solid diet.  -Your final pathology results from surgery should be available by the Friday after surgery and the results will be relayed to you when available.

## 2014-06-12 NOTE — Progress Notes (Signed)
Preop Visit Note: Gyn-Onc   Kristin Hess 49 y.o. female  Chief Complaint  Patient presents with  . complex ovarian cyst    Assessment : Small ovarian cysts with some internal projections and elevated CA-125 andOVA1.  Plan: Kristin Hess is scheduled for a robotic assisted BSO and possible hysterectomy or staging pending frozen section results of the ovaries. The risks of surgery including hemorrhage infection injury to adjacent viscera thromboembolic, conditions were discussed with the patient. She also understands that bilateral salpingo-oophorectomy will result in menopause and potential menopausal symptoms. She is already experiencing some of these, and would prefer to see if these symptoms are mild or severe before deciding upon postop HRT.  We discussed the role of hysterectomy at the time of BSO if the ovaries are benign. She will consider this and its additional risks and recovery and notify me preoperatively if she is interested in this addition to the surgical plan.  HPI: 49 year old Afro-American female gravida 2 seen in consultation at the request of Dr. Everett Hess regarding management of an ovarian cyst and an elevated CA-125 and OVA1. The patient reports she had an ovarian cyst found at the time of IUD removal approximately a year ago. A follow-up ultrasound on 05/07/2014 of the uterus with a 1 cm uterine fibroid and a 3 x 2.4 x 2.4 cm cystic mass with fine internal echoes in the left ovary and a right ovary with a 3.8 x 2.6 x 2.7 cm mildly complex cyst with internal echoes and wall projections. There is no free pelvic fluid. CA-125 value was 278 units per mL and OVA1 was 8.9. Statistically, based on the OVA1 result, the patient has approximate 70% chance of having ovarian cancer. She subsequent had a CT scan that is essentially normal. The patient does note some low-grade pelvic discomfort and bloating for the past several months. She has no other constitutional symptoms.  The  patient is had 2 prior cesarean sections but no other abdominal or pelvic surgery. She has a great grandmother who had ovarian cancer. However there were no other women in the family with breast ovarian endometrial or colon cancer.  Review of Systems:10 point review of systems is negative except as noted in interval history.   Vitals: Blood pressure 143/92, pulse 95, temperature 98.5 F (36.9 C), temperature source Oral, resp. rate 16, height 5' 7.5" (1.715 m), weight 203 lb 6.4 oz (92.262 kg), last menstrual period 05/16/2014.  Physical Exam: General : The patient is a healthy woman in no acute distress.  HEENT: normocephalic, extraoccular movements normal; neck is supple without thyromegally  Lynphnodes: Supraclavicular and inguinal nodes not enlarged  Abdomen: Soft, non-tender, no ascites, no organomegally, no masses, no hernias  Pelvic:  EGBUS: Normal female  Vagina: Normal, no lesions  Urethra and Bladder: Normal, non-tender  Cervix: Normal there are no lesions Uterus: Anterior, normal shape size and consistency Bi-manual examination: Non-tender; no adenxal masses or nodularity  Rectal: normal sphincter tone, no masses, no blood  Lower extremities: No edema or varicosities. Normal range of motion      Allergies  Allergen Reactions  . Ace Inhibitors Cough  . Hctz [Hydrochlorothiazide] Cough    Past Medical History  Diagnosis Date  . Hypertension   . Diabetes mellitus without complication   . Adrenal adenoma   . Hyperaldosteronism     Possibly  . Hypokalemia   . Hyperlipidemia   . Allergy   . Anemia   . Headache     MIGRAINES  .  GERD (gastroesophageal reflux disease)   . Ovarian cyst     Past Surgical History  Procedure Laterality Date  . Cesarean section      X 2  . Cardiac catheterization    . Tubal ligation      Current Outpatient Prescriptions  Medication Sig Dispense Refill  . carvedilol (COREG) 6.25 MG tablet Take 1 tablet (6.25 mg total) by mouth 2  (two) times daily with a meal. (Patient taking differently: Take 6.25 mg by mouth at bedtime. ) 180 tablet 3  . Cholecalciferol (VITAMIN D PO) Take 50,000 Units by mouth daily.     . Cyanocobalamin (B-12 PO) Take 1 tablet by mouth daily.    . INVOKANA 300 MG TABS tablet 300 mg daily before breakfast.     . Iron TABS Take 1 tablet by mouth daily.    Marland Kitchen losartan (COZAAR) 100 MG tablet Take 50 mg by mouth at bedtime.     . pantoprazole (PROTONIX) 40 MG tablet Take 1 tablet (40 mg total) by mouth daily. 30 tablet 0  . spironolactone (ALDACTONE) 100 MG tablet Take 100 mg by mouth at bedtime.    Marland Kitchen aspirin 81 MG tablet Take 81 mg by mouth daily.     No current facility-administered medications for this visit.    History   Social History  . Marital Status: Married    Spouse Name: N/A    Number of Children: N/A  . Years of Education: N/A   Occupational History  . Not on file.   Social History Main Topics  . Smoking status: Never Smoker   . Smokeless tobacco: Not on file  . Alcohol Use: No  . Drug Use: No  . Sexual Activity: Not on file   Other Topics Concern  . Not on file   Social History Narrative    Family History  Problem Relation Age of Onset  . Diabetes Father   . Hepatitis C Father   . Stroke Mother   . Diabetes Mother   . Stroke Brother       Kristin Eva, MD 06/12/2014, 1:42 PM

## 2014-06-15 ENCOUNTER — Encounter (HOSPITAL_COMMUNITY): Payer: Self-pay | Admitting: Anesthesiology

## 2014-06-15 ENCOUNTER — Telehealth: Payer: Self-pay | Admitting: Gynecologic Oncology

## 2014-06-15 NOTE — Telephone Encounter (Signed)
Returned call to patient.  Message left stating she would like to proceed with a total hysterectomy.  Called to speak with her and she stated she had decided to proceed with a hysterectomy and BSO.  Advised to call for any concerns.  Pre-op notified.  Surgery scheduling also notified.

## 2014-06-15 NOTE — Anesthesia Preprocedure Evaluation (Addendum)
Anesthesia Evaluation  Patient identified by MRN, date of birth, ID band Patient awake    Reviewed: Allergy & Precautions, NPO status , Patient's Chart, lab work & pertinent test results  Airway Mallampati: II  TM Distance: >3 FB Neck ROM: Full    Dental no notable dental hx.    Pulmonary neg pulmonary ROS,  breath sounds clear to auscultation  Pulmonary exam normal       Cardiovascular hypertension, Pt. on medications and Pt. on home beta blockers Rhythm:Regular Rate:Normal  ECG: poor R wave progression.   Neuro/Psych  Headaches, negative psych ROS   GI/Hepatic Neg liver ROS, GERD-  Medicated,  Endo/Other  negative endocrine ROSdiabetes  Renal/GU negative Renal ROS  negative genitourinary   Musculoskeletal negative musculoskeletal ROS (+)   Abdominal (+) + obese,   Peds negative pediatric ROS (+)  Hematology  (+) anemia ,   Anesthesia Other Findings   Reproductive/Obstetrics negative OB ROS                            Anesthesia Physical Anesthesia Plan  ASA: II  Anesthesia Plan: General   Post-op Pain Management:    Induction: Intravenous  Airway Management Planned: Oral ETT  Additional Equipment:   Intra-op Plan:   Post-operative Plan: Extubation in OR  Informed Consent: I have reviewed the patients History and Physical, chart, labs and discussed the procedure including the risks, benefits and alternatives for the proposed anesthesia with the patient or authorized representative who has indicated his/her understanding and acceptance.   Dental advisory given  Plan Discussed with: CRNA  Anesthesia Plan Comments:         Anesthesia Quick Evaluation

## 2014-06-16 ENCOUNTER — Encounter (HOSPITAL_COMMUNITY): Payer: Self-pay | Admitting: Certified Registered Nurse Anesthetist

## 2014-06-16 ENCOUNTER — Ambulatory Visit (HOSPITAL_COMMUNITY): Payer: Managed Care, Other (non HMO) | Admitting: Anesthesiology

## 2014-06-16 ENCOUNTER — Encounter (HOSPITAL_COMMUNITY)
Admission: RE | Disposition: A | Discharge status: Home / Self Care | Payer: Self-pay | Source: Ambulatory Visit | Attending: Gynecologic Oncology

## 2014-06-16 ENCOUNTER — Ambulatory Visit (HOSPITAL_COMMUNITY)
Admission: RE | Admit: 2014-06-16 | Discharge: 2014-06-18 | Disposition: A | Discharge status: Home / Self Care | Payer: Managed Care, Other (non HMO) | Source: Ambulatory Visit | Attending: Gynecologic Oncology | Admitting: Gynecologic Oncology

## 2014-06-16 DIAGNOSIS — N8320 Unspecified ovarian cysts: Secondary | ICD-10-CM | POA: Diagnosis not present

## 2014-06-16 DIAGNOSIS — N804 Endometriosis of rectovaginal septum, unspecified involvement of vagina: Secondary | ICD-10-CM | POA: Insufficient documentation

## 2014-06-16 DIAGNOSIS — N83202 Unspecified ovarian cyst, left side: Secondary | ICD-10-CM

## 2014-06-16 DIAGNOSIS — N803 Endometriosis of pelvic peritoneum: Secondary | ICD-10-CM

## 2014-06-16 DIAGNOSIS — D259 Leiomyoma of uterus, unspecified: Secondary | ICD-10-CM | POA: Diagnosis not present

## 2014-06-16 DIAGNOSIS — D649 Anemia, unspecified: Secondary | ICD-10-CM | POA: Insufficient documentation

## 2014-06-16 DIAGNOSIS — N83209 Unspecified ovarian cyst, unspecified side: Secondary | ICD-10-CM

## 2014-06-16 DIAGNOSIS — Z79899 Other long term (current) drug therapy: Secondary | ICD-10-CM | POA: Diagnosis not present

## 2014-06-16 DIAGNOSIS — K219 Gastro-esophageal reflux disease without esophagitis: Secondary | ICD-10-CM | POA: Diagnosis not present

## 2014-06-16 DIAGNOSIS — R229 Localized swelling, mass and lump, unspecified: Secondary | ICD-10-CM | POA: Diagnosis present

## 2014-06-16 DIAGNOSIS — N801 Endometriosis of ovary: Secondary | ICD-10-CM | POA: Insufficient documentation

## 2014-06-16 DIAGNOSIS — Z8041 Family history of malignant neoplasm of ovary: Secondary | ICD-10-CM | POA: Insufficient documentation

## 2014-06-16 DIAGNOSIS — E785 Hyperlipidemia, unspecified: Secondary | ICD-10-CM | POA: Diagnosis not present

## 2014-06-16 DIAGNOSIS — E876 Hypokalemia: Secondary | ICD-10-CM | POA: Insufficient documentation

## 2014-06-16 DIAGNOSIS — Z888 Allergy status to other drugs, medicaments and biological substances status: Secondary | ICD-10-CM | POA: Diagnosis not present

## 2014-06-16 DIAGNOSIS — N809 Endometriosis, unspecified: Secondary | ICD-10-CM | POA: Diagnosis present

## 2014-06-16 DIAGNOSIS — R140 Abdominal distension (gaseous): Secondary | ICD-10-CM

## 2014-06-16 DIAGNOSIS — E119 Type 2 diabetes mellitus without complications: Secondary | ICD-10-CM | POA: Diagnosis not present

## 2014-06-16 DIAGNOSIS — R971 Elevated cancer antigen 125 [CA 125]: Secondary | ICD-10-CM | POA: Diagnosis not present

## 2014-06-16 DIAGNOSIS — I10 Essential (primary) hypertension: Secondary | ICD-10-CM | POA: Diagnosis not present

## 2014-06-16 DIAGNOSIS — N83201 Unspecified ovarian cyst, right side: Secondary | ICD-10-CM | POA: Diagnosis present

## 2014-06-16 DIAGNOSIS — R1915 Other abnormal bowel sounds: Secondary | ICD-10-CM

## 2014-06-16 HISTORY — PX: ROBOTIC ASSISTED TOTAL HYSTERECTOMY WITH BILATERAL SALPINGO OOPHERECTOMY: SHX6086

## 2014-06-16 LAB — TYPE AND SCREEN
ABO/RH(D): A POS
Antibody Screen: NEGATIVE

## 2014-06-16 LAB — GLUCOSE, CAPILLARY
GLUCOSE-CAPILLARY: 151 mg/dL — AB (ref 70–99)
GLUCOSE-CAPILLARY: 205 mg/dL — AB (ref 70–99)
Glucose-Capillary: 215 mg/dL — ABNORMAL HIGH (ref 70–99)
Glucose-Capillary: 237 mg/dL — ABNORMAL HIGH (ref 70–99)
Glucose-Capillary: 171 mg/dL — ABNORMAL HIGH (ref 70–99)

## 2014-06-16 SURGERY — ROBOTIC ASSISTED TOTAL HYSTERECTOMY WITH BILATERAL SALPINGO OOPHORECTOMY
Anesthesia: General | Laterality: Bilateral

## 2014-06-16 MED ORDER — EPHEDRINE SULFATE 50 MG/ML IJ SOLN
INTRAMUSCULAR | Status: AC
  Filled 2014-06-16: qty 1

## 2014-06-16 MED ORDER — OXYCODONE-ACETAMINOPHEN 5-325 MG PO TABS
1.0000 | ORAL_TABLET | ORAL | Status: DC | PRN
  Administered 2014-06-17 – 2014-06-18 (×2): 2 via ORAL
  Filled 2014-06-16 (×2): qty 2

## 2014-06-16 MED ORDER — HYDROMORPHONE HCL 1 MG/ML IJ SOLN
INTRAMUSCULAR | Status: AC
  Filled 2014-06-16: qty 1

## 2014-06-16 MED ORDER — SUCCINYLCHOLINE CHLORIDE 20 MG/ML IJ SOLN
INTRAMUSCULAR | Status: DC | PRN
  Administered 2014-06-16: 100 mg via INTRAVENOUS

## 2014-06-16 MED ORDER — NEOSTIGMINE METHYLSULFATE 10 MG/10ML IV SOLN
INTRAVENOUS | Status: DC | PRN
  Administered 2014-06-16: 4 mg via INTRAVENOUS

## 2014-06-16 MED ORDER — PROPOFOL 10 MG/ML IV BOLUS
INTRAVENOUS | Status: DC | PRN
  Administered 2014-06-16: 150 mg via INTRAVENOUS

## 2014-06-16 MED ORDER — LACTATED RINGERS IV SOLN
INTRAVENOUS | Status: DC | PRN
  Administered 2014-06-16 (×2): via INTRAVENOUS

## 2014-06-16 MED ORDER — KCL IN DEXTROSE-NACL 20-5-0.45 MEQ/L-%-% IV SOLN
INTRAVENOUS | Status: DC
  Administered 2014-06-16: 15:00:00 via INTRAVENOUS
  Administered 2014-06-17: 1000 mL via INTRAVENOUS
  Filled 2014-06-16 (×3): qty 1000

## 2014-06-16 MED ORDER — PROMETHAZINE HCL 25 MG/ML IJ SOLN
INTRAMUSCULAR | Status: AC
  Filled 2014-06-16: qty 1

## 2014-06-16 MED ORDER — MIDAZOLAM HCL 5 MG/5ML IJ SOLN
INTRAMUSCULAR | Status: DC | PRN
  Administered 2014-06-16: 2 mg via INTRAVENOUS

## 2014-06-16 MED ORDER — LIDOCAINE HCL (CARDIAC) 20 MG/ML IV SOLN
INTRAVENOUS | Status: DC | PRN
  Administered 2014-06-16: 50 mg via INTRAVENOUS

## 2014-06-16 MED ORDER — HYDROMORPHONE HCL 1 MG/ML IJ SOLN
0.5000 mg | INTRAMUSCULAR | Status: DC | PRN
  Administered 2014-06-16 – 2014-06-17 (×2): 0.5 mg via INTRAVENOUS
  Filled 2014-06-16 (×2): qty 1

## 2014-06-16 MED ORDER — CANAGLIFLOZIN 300 MG PO TABS
300.0000 mg | ORAL_TABLET | Freq: Every day | ORAL | Status: DC
  Administered 2014-06-17: 300 mg via ORAL
  Filled 2014-06-16 (×2): qty 300

## 2014-06-16 MED ORDER — INSULIN ASPART 100 UNIT/ML ~~LOC~~ SOLN
3.0000 [IU] | Freq: Three times a day (TID) | SUBCUTANEOUS | Status: DC
  Administered 2014-06-17: 3 [IU] via SUBCUTANEOUS

## 2014-06-16 MED ORDER — LACTATED RINGERS IR SOLN
Status: DC | PRN
  Administered 2014-06-16: 1000 mL

## 2014-06-16 MED ORDER — GLYCOPYRROLATE 0.2 MG/ML IJ SOLN
INTRAMUSCULAR | Status: DC | PRN
  Administered 2014-06-16: 0.6 mg via INTRAVENOUS

## 2014-06-16 MED ORDER — MIDAZOLAM HCL 2 MG/2ML IJ SOLN
INTRAMUSCULAR | Status: AC
  Filled 2014-06-16: qty 2

## 2014-06-16 MED ORDER — TRAMADOL HCL 50 MG PO TABS
100.0000 mg | ORAL_TABLET | Freq: Four times a day (QID) | ORAL | Status: DC | PRN
  Administered 2014-06-16: 100 mg via ORAL
  Filled 2014-06-16: qty 2

## 2014-06-16 MED ORDER — PHENYLEPHRINE HCL 10 MG/ML IJ SOLN
INTRAMUSCULAR | Status: DC | PRN
  Administered 2014-06-16 (×3): 80 ug via INTRAVENOUS

## 2014-06-16 MED ORDER — CARVEDILOL 6.25 MG PO TABS
6.2500 mg | ORAL_TABLET | Freq: Two times a day (BID) | ORAL | Status: DC
  Administered 2014-06-16 – 2014-06-18 (×3): 6.25 mg via ORAL
  Filled 2014-06-16 (×6): qty 1

## 2014-06-16 MED ORDER — CETYLPYRIDINIUM CHLORIDE 0.05 % MT LIQD
7.0000 mL | Freq: Two times a day (BID) | OROMUCOSAL | Status: DC
  Administered 2014-06-16 – 2014-06-17 (×4): 7 mL via OROMUCOSAL

## 2014-06-16 MED ORDER — INSULIN ASPART 100 UNIT/ML ~~LOC~~ SOLN
SUBCUTANEOUS | Status: DC | PRN
  Administered 2014-06-16: 4 [IU] via INTRAVENOUS

## 2014-06-16 MED ORDER — ASPIRIN 81 MG PO TABS
81.0000 mg | ORAL_TABLET | Freq: Every day | ORAL | Status: DC
  Administered 2014-06-17 – 2014-06-18 (×2): 81 mg via ORAL
  Filled 2014-06-16 (×2): qty 1

## 2014-06-16 MED ORDER — HYDROMORPHONE HCL 1 MG/ML IJ SOLN
INTRAMUSCULAR | Status: AC
  Administered 2014-06-16: 0.5 mg
  Filled 2014-06-16: qty 1

## 2014-06-16 MED ORDER — ROCURONIUM BROMIDE 100 MG/10ML IV SOLN
INTRAVENOUS | Status: AC
Start: 2014-06-16 — End: 2014-06-16
  Filled 2014-06-16: qty 1

## 2014-06-16 MED ORDER — ROCURONIUM BROMIDE 100 MG/10ML IV SOLN
INTRAVENOUS | Status: DC | PRN
  Administered 2014-06-16: 40 mg via INTRAVENOUS
  Administered 2014-06-16: 30 mg via INTRAVENOUS

## 2014-06-16 MED ORDER — LOSARTAN POTASSIUM 50 MG PO TABS
50.0000 mg | ORAL_TABLET | Freq: Every day | ORAL | Status: DC
  Administered 2014-06-17: 50 mg via ORAL
  Filled 2014-06-16 (×3): qty 1

## 2014-06-16 MED ORDER — CEFAZOLIN SODIUM-DEXTROSE 2-3 GM-% IV SOLR
2.0000 g | INTRAVENOUS | Status: AC
  Administered 2014-06-16: 2 g via INTRAVENOUS

## 2014-06-16 MED ORDER — ONDANSETRON HCL 4 MG PO TABS: 4.0000 mg | ORAL_TABLET | Freq: Four times a day (QID) | ORAL | Status: DC | PRN

## 2014-06-16 MED ORDER — FENTANYL CITRATE 0.05 MG/ML IJ SOLN
INTRAMUSCULAR | Status: AC
  Filled 2014-06-16: qty 5

## 2014-06-16 MED ORDER — ONDANSETRON HCL 4 MG/2ML IJ SOLN
INTRAMUSCULAR | Status: AC
  Filled 2014-06-16: qty 2

## 2014-06-16 MED ORDER — FENTANYL CITRATE 0.05 MG/ML IJ SOLN
INTRAMUSCULAR | Status: DC | PRN
  Administered 2014-06-16: 100 ug via INTRAVENOUS
  Administered 2014-06-16: 50 ug via INTRAVENOUS

## 2014-06-16 MED ORDER — SODIUM CHLORIDE 0.9 % IJ SOLN
INTRAMUSCULAR | Status: AC
  Filled 2014-06-16: qty 10

## 2014-06-16 MED ORDER — EPHEDRINE SULFATE 50 MG/ML IJ SOLN
INTRAMUSCULAR | Status: DC | PRN
  Administered 2014-06-16 (×3): 10 mg via INTRAVENOUS

## 2014-06-16 MED ORDER — LACTATED RINGERS IV SOLN: INTRAVENOUS | Status: DC

## 2014-06-16 MED ORDER — LIDOCAINE HCL (CARDIAC) 20 MG/ML IV SOLN
INTRAVENOUS | Status: AC
  Filled 2014-06-16: qty 5

## 2014-06-16 MED ORDER — HYDROMORPHONE HCL 1 MG/ML IJ SOLN
0.2500 mg | INTRAMUSCULAR | Status: DC | PRN
  Administered 2014-06-16: 0.25 mg via INTRAVENOUS

## 2014-06-16 MED ORDER — ONDANSETRON HCL 4 MG/2ML IJ SOLN
INTRAMUSCULAR | Status: DC | PRN
  Administered 2014-06-16: 4 mg via INTRAVENOUS

## 2014-06-16 MED ORDER — PHENYLEPHRINE 40 MCG/ML (10ML) SYRINGE FOR IV PUSH (FOR BLOOD PRESSURE SUPPORT)
PREFILLED_SYRINGE | INTRAVENOUS | Status: AC
Start: 2014-06-16 — End: 2014-06-16
  Filled 2014-06-16: qty 10

## 2014-06-16 MED ORDER — INSULIN ASPART 100 UNIT/ML ~~LOC~~ SOLN
0.0000 [IU] | Freq: Three times a day (TID) | SUBCUTANEOUS | Status: DC
Start: 2014-06-16 — End: 2014-06-17
  Administered 2014-06-16: 3 [IU] via SUBCUTANEOUS
  Administered 2014-06-17: 5 [IU] via SUBCUTANEOUS
  Administered 2014-06-17: 3 [IU] via SUBCUTANEOUS

## 2014-06-16 MED ORDER — CEFAZOLIN SODIUM-DEXTROSE 2-3 GM-% IV SOLR
INTRAVENOUS | Status: AC
  Filled 2014-06-16: qty 50

## 2014-06-16 MED ORDER — GLYCOPYRROLATE 0.2 MG/ML IJ SOLN
INTRAMUSCULAR | Status: AC
  Filled 2014-06-16: qty 3

## 2014-06-16 MED ORDER — STERILE WATER FOR IRRIGATION IR SOLN
Status: DC | PRN
  Administered 2014-06-16: 3000 mL

## 2014-06-16 MED ORDER — NEOSTIGMINE METHYLSULFATE 10 MG/10ML IV SOLN
INTRAVENOUS | Status: AC
  Filled 2014-06-16: qty 1

## 2014-06-16 MED ORDER — PROPOFOL 10 MG/ML IV BOLUS
INTRAVENOUS | Status: AC
  Filled 2014-06-16: qty 20

## 2014-06-16 MED ORDER — ONDANSETRON HCL 4 MG/2ML IJ SOLN: 4.0000 mg | Freq: Four times a day (QID) | INTRAMUSCULAR | Status: DC | PRN

## 2014-06-16 MED ORDER — PANTOPRAZOLE SODIUM 40 MG PO TBEC
40.0000 mg | DELAYED_RELEASE_TABLET | Freq: Every day | ORAL | Status: DC
  Administered 2014-06-16 – 2014-06-18 (×3): 40 mg via ORAL
  Filled 2014-06-16 (×3): qty 1

## 2014-06-16 MED ORDER — PROMETHAZINE HCL 25 MG/ML IJ SOLN
6.2500 mg | INTRAMUSCULAR | Status: AC | PRN
Start: 2014-06-16 — End: 2014-06-16
  Administered 2014-06-16 (×2): 6.25 mg via INTRAVENOUS

## 2014-06-16 SURGICAL SUPPLY — 49 items
BAG SPEC RTRVL LRG 6X4 10 (ENDOMECHANICALS)
CABLE HIGH FREQUENCY MONO STRZ (ELECTRODE) ×2 IMPLANT
CHLORAPREP W/TINT 26ML (MISCELLANEOUS) ×3 IMPLANT
CORDS BIPOLAR (ELECTRODE) ×2 IMPLANT
COVER SURGICAL LIGHT HANDLE (MISCELLANEOUS) ×1 IMPLANT
COVER TIP SHEARS 8 DVNC (MISCELLANEOUS) ×1 IMPLANT
COVER TIP SHEARS 8MM DA VINCI (MISCELLANEOUS) ×1
DRAPE SHEET LG 3/4 BI-LAMINATE (DRAPES) ×4 IMPLANT
DRAPE SURG IRRIG POUCH 19X23 (DRAPES) ×2 IMPLANT
DRAPE TABLE BACK 44X90 PK DISP (DRAPES) ×4 IMPLANT
DRAPE WARM FLUID 44X44 (DRAPE) ×2 IMPLANT
DRSG TEGADERM 6X8 (GAUZE/BANDAGES/DRESSINGS) ×4 IMPLANT
ELECT REM PT RETURN 9FT ADLT (ELECTROSURGICAL) ×2
ELECTRODE REM PT RTRN 9FT ADLT (ELECTROSURGICAL) ×1 IMPLANT
GLOVE BIO SURGEON STRL SZ 6 (GLOVE) ×6 IMPLANT
GLOVE BIO SURGEON STRL SZ 6.5 (GLOVE) ×4 IMPLANT
GOWN STRL REUS W/ TWL LRG LVL3 (GOWN DISPOSABLE) IMPLANT
GOWN STRL REUS W/ TWL LRG LVL4 (GOWN DISPOSABLE) ×3 IMPLANT
GOWN STRL REUS W/TWL LRG LVL3 (GOWN DISPOSABLE) ×11 IMPLANT
GOWN STRL REUS W/TWL LRG LVL4 (GOWN DISPOSABLE)
HOLDER FOLEY CATH W/STRAP (MISCELLANEOUS) ×2 IMPLANT
KIT ACCESSORY DA VINCI DISP (KITS) ×1
KIT ACCESSORY DVNC DISP (KITS) ×1 IMPLANT
KIT BASIN OR (CUSTOM PROCEDURE TRAY) ×2 IMPLANT
LIQUID BAND (GAUZE/BANDAGES/DRESSINGS) ×3 IMPLANT
MANIPULATOR UTERINE 4.5 ZUMI (MISCELLANEOUS) ×2 IMPLANT
OCCLUDER COLPOPNEUMO (BALLOONS) ×2 IMPLANT
PEN SKIN MARKING BROAD (MISCELLANEOUS) ×3 IMPLANT
POUCH SPECIMEN RETRIEVAL 10MM (ENDOMECHANICALS) IMPLANT
SET TUBE IRRIG SUCTION NO TIP (IRRIGATION / IRRIGATOR) ×2 IMPLANT
SHEET LAVH (DRAPES) ×2 IMPLANT
SOLUTION ANTI FOG 6CC (MISCELLANEOUS) ×2 IMPLANT
SOLUTION ELECTROLUBE (MISCELLANEOUS) ×2 IMPLANT
SUT VIC AB 0 CT1 27 (SUTURE) ×2
SUT VIC AB 0 CT1 27XBRD ANTBC (SUTURE) ×1 IMPLANT
SUT VIC AB 4-0 PS2 27 (SUTURE) ×4 IMPLANT
SUT VICRYL 0 UR6 27IN ABS (SUTURE) ×1 IMPLANT
SYR 50ML LL SCALE MARK (SYRINGE) ×2 IMPLANT
SYR BULB IRRIGATION 50ML (SYRINGE) ×1 IMPLANT
TOWEL OR 17X26 10 PK STRL BLUE (TOWEL DISPOSABLE) ×4 IMPLANT
TOWEL OR NON WOVEN STRL DISP B (DISPOSABLE) ×2 IMPLANT
TRAP SPECIMEN MUCOUS 40CC (MISCELLANEOUS) ×1 IMPLANT
TRAY FOLEY CATH 14FRSI W/METER (CATHETERS) ×2 IMPLANT
TRAY LAPAROSCOPIC (CUSTOM PROCEDURE TRAY) ×2 IMPLANT
TROCAR 12M 150ML BLUNT (TROCAR) ×2 IMPLANT
TROCAR BLADELESS OPT 5 100 (ENDOMECHANICALS) ×2 IMPLANT
TROCAR XCEL 12X100 BLDLESS (ENDOMECHANICALS) ×2 IMPLANT
TUBING INSUFFLATION 10FT LAP (TUBING) ×2 IMPLANT
WATER STERILE IRR 1500ML POUR (IV SOLUTION) ×4 IMPLANT

## 2014-06-16 NOTE — Anesthesia Postprocedure Evaluation (Signed)
  Anesthesia Post-op Note  Patient: Kristin Hess  Procedure(s) Performed: Procedure(s): ROBOTIC ASSISTED TOTAL HYSTERECTOMY WITH BILATERAL SALPINGO OOPHORECTOMY, LYSIS OF ADHESIONS (Bilateral)  Patient Location: PACU  Anesthesia Type:General  Level of Consciousness: awake and alert   Airway and Oxygen Therapy: Patient Spontanous Breathing  Post-op Pain: none  Post-op Assessment: Post-op Vital signs reviewed  Post-op Vital Signs: Reviewed  Last Vitals:  Filed Vitals:   06/16/14 1300  BP: 123/77  Pulse:   Temp:   Resp: 15    Complications: No apparent anesthesia complications

## 2014-06-16 NOTE — Op Note (Signed)
Surgeon: Donaciano Eva   Assistants: Dr Lahoma Crocker (an MD assistant was necessary for tissue manipulation, management of robotic instrumentation, retraction and positioning due to the complexity of the case and hospital policies).   Anesthesia: General endotracheal anesthesia  ASA Class: 2   Pre-operative Diagnosis: adnexal mass and elevated CA 125  Post-operative Diagnosis: adnexal mass and stage IV endometriosis, endometriomas  Operation: Robotic-assisted laparoscopic hysterectomy with bilateral salpingoophorectomy, lysis of adhesions x 1 hour  (modifier for dense adhesions increasing complexity of case and case duration by 1 hour).  Surgeon: Donaciano Eva  Assistant Surgeon: Lahoma Crocker MD  Anesthesia: GET  Urine Output: 200  Operative Findings:  : dense adhesions between rectum and cervix/obliterated cul de sac, dense side wall adhesions with endometriomas on ovaries. Ovaries adherent to eachother in the posterior midline. Bilateral 3-4cm ovarian masses containing chocolate colored fluid, ruptured intraoperatively. Frozen section revealed endometriosis and no malignancy. 12 week size uterus.  1.  No extrauterine disease. 2.  A 12 cm uterus. 3.  Adhesions between omentum and anterior abdominal wall  Procedure Details  The patient was seen in the Holding Room. The risks, benefits, complications, treatment options, and expected outcomes were discussed with the patient.  The patient concurred with the proposed plan, giving informed consent.  The site of surgery properly noted/marked. The patient was identified as Conservation officer, nature and the procedure verified as a Robotic-assisted hysterectomy with bilateral salpingo oophorectomy. A Time Out was held and the above information confirmed.  After induction of anesthesia, the patient was draped and prepped in the usual sterile manner. Pt was placed in supine position after anesthesia and draped and prepped in  the usual sterile manner. The abdominal drape was placed after the CholoraPrep had been allowed to dry for 3 minutes.  Her arms were tucked to her side with all appropriate precautions.  The chest was secured to the table.  The patient was placed in the semi-lithotomy position in [place].  The perineum was prepped with Betadine.  Foley catheter was placed.  A sterile speculum was placed in the vagina.  The cervix was grasped with a single-tooth tenaculum and dilated with Kennon Rounds dilators.  The ZUMI uterine manipulator with a medium colpotomizer ring was placed without difficulty.  A pneum occluder balloon was placed over the manipulator.  A second time-out was performed.  OG tube placement was confirmed and to suction.     Procedure:  The patient was brought to the operating room where general anesthesia was administered with no complications.  The patient was placed in the dorsal lithotomy position in padded Allen stirrups.  The arms were tucked at the sides with gel pads protecting the elbows and foam protecting the hands. The patient was then prepped.  A Foley was placed to gravity.  A medium size KOH ring was used to place around the cervix after the cervix had been dilated and then a RUMI manipulator was attached in the normal manner.  The patient was then draped in the normal manner.  Next, a 5 mm skin incision was made 1 cm below the subcostal margin in the midclavicular line.  The 5 mm Optiview port and scope was used for direct entry.  Opening pressure was under 10 mm CO2.  The abdomen was insufflated and the findings were noted as above.   At this point and all points during the procedure, the patient's intra-abdominal pressure did not exceed 15 mmHg. Next, a 10 mm skin incision  was made in the umbilicus and a right and left port was placed about 10 cm lateral to and 15 degree caudad to the robot port on the right and left side.  A fourth arm was placed in the left lower quadrant 2 cm above and  superior and medial to the anterior superior iliac spine.  All ports were placed under direct visualization.  The patient was placed in steep Trendelenburg.  Bowel was away into the upper abdomen.  The robot was docked in the normal manner.  The endoshear were used to sharply take down omental adhesions with the anterior abdominal wall.  The hysterectomy was started after the round ligament on the right side was incised and the retroperitoneum was entered and the pararectal space was developed.  The ureter was noted to be on the medial leaf of the broad ligament.  The peritoneum above the ureter was incised and stretched and the infundibulopelvic ligament was skeletonized, cauterized and cut.  The posterior peritoneum was taken down to the level of the KOH ring.  The anterior peritoneum was also taken down.  The bladder flap was created to the level of the KOH ring.  The uterine artery on the right side was skeletonized, cauterized and cut in the normal manner.  A similar procedure was performed on the left.  There were extensive posterior adhesions between the ovaries and the uterus and the rectum. These were dissected free with sharp dissection to normalize and mobilize the anatomy to reveal the koh cup posteriorally. The colpotomy was made and the uterus, cervix, bilateral ovaries and tubes were amputated and delivered through the vagina.  Pedicles were inspected and excellent hemostasis was achieved.    The colpotomy at the vaginal cuff was closed with Vicryl on a CT1 needle in a running manner.  Irrigation was used and excellent hemostasis was achieved. The pelvis was filled with irrigation fluid, the sigmoid colon was compressed to occlude it and the rectum was insufflated with an asepto of air to ensure there were no bubbles of gas emenating from a rectal injury. The ureters were inspected retroperitoneally along their course into the pelvis to verify that they were intact and not part of the surgical  dissection. None were noted. At this point in the procedure was completed.  Robotic instruments were removed under direct visulaization.  The robot was undocked. The 10 mm port was closed with Vicryl on a UR-5 needle and the fascia was closed with 0 Vicryl on a UR-5 needle.  The skin was closed with 4-0 Vicryl in a subcuticular manner.  Steri-Strips were applied and bandages were also applied.  Sponge, lap and needle counts correct x 2.  The patient was taken to the recovery room in stable condition.  The vagina was swabbed with  minimal bleeding noted.   All instrument and needle counts were correct x  3.   The patient was transferred to the recovery room in stable condition.  Estimated Blood Loss:  96mL      Specimens: uterus, tubes, ovaries bilaterally         Complications:  None; patient tolerated the procedure well.         Disposition: PACU - hemodynamically stable.

## 2014-06-16 NOTE — Interval H&P Note (Signed)
History and Physical Interval Note:  06/16/2014 7:16 AM  Kristin Hess  has presented today for surgery, with the diagnosis of complex ovarian cyst  The various methods of treatment have been discussed with the patient and family. After consideration of risks, benefits and other options for treatment, the patient has consented to  Procedure(s): ROBOTIC ASSISTED TOTAL HYSTERECTOMY WITH BILATERAL SALPINGO OOPHORECTOMY (Bilateral) as a surgical intervention .  The patient's history has been reviewed, patient examined, no change in status, stable for surgery.  I have reviewed the patient's chart and labs.  Questions were answered to the patient's satisfaction.  The patient's blood glucose is elevated (200's), and we discussed that this can increase her risk for perioperative infection or impaired healing. She will attempt postoperative optimization. She would like to proceed as planned.   Donaciano Eva

## 2014-06-16 NOTE — Transfer of Care (Signed)
Immediate Anesthesia Transfer of Care Note  Patient: Kristin Hess  Procedure(s) Performed: Procedure(s): ROBOTIC ASSISTED TOTAL HYSTERECTOMY WITH BILATERAL SALPINGO OOPHORECTOMY, LYSIS OF ADHESIONS (Bilateral)  Patient Location: PACU  Anesthesia Type:General  Level of Consciousness: awake, alert  and oriented  Airway & Oxygen Therapy: Patient Spontanous Breathing and Patient connected to face mask oxygen  Post-op Assessment: Report given to PACU RN and Post -op Vital signs reviewed and stable  Post vital signs: Reviewed and stable  Complications: No apparent anesthesia complications

## 2014-06-16 NOTE — Anesthesia Procedure Notes (Signed)
Procedure Name: Intubation Date/Time: 06/16/2014 7:36 AM Performed by: Dimas Millin, Deniqua Perry F Pre-anesthesia Checklist: Patient identified, Emergency Drugs available, Suction available, Patient being monitored and Timeout performed Patient Re-evaluated:Patient Re-evaluated prior to inductionOxygen Delivery Method: Circle system utilized Preoxygenation: Pre-oxygenation with 100% oxygen Intubation Type: IV induction Laryngoscope Size: Miller and 2 Grade View: Grade I Tube type: Oral Tube size: 7.0 mm Number of attempts: 1 Airway Equipment and Method: Stylet and Oral airway Placement Confirmation: ETT inserted through vocal cords under direct vision,  positive ETCO2 and breath sounds checked- equal and bilateral Secured at: 22 cm Tube secured with: Tape Dental Injury: Teeth and Oropharynx as per pre-operative assessment

## 2014-06-16 NOTE — H&P (View-Only) (Signed)
Preop Visit Note: Gyn-Onc   Kristin Hess 49 y.o. female  Chief Complaint  Patient presents with  . complex ovarian cyst    Assessment : Small ovarian cysts with some internal projections and elevated CA-125 andOVA1.  Plan: Kristin Hess is scheduled for a robotic assisted BSO and possible hysterectomy or staging pending frozen section results of the ovaries. The risks of surgery including hemorrhage infection injury to adjacent viscera thromboembolic, conditions were discussed with the patient. She also understands that bilateral salpingo-oophorectomy will result in menopause and potential menopausal symptoms. She is already experiencing some of these, and would prefer to see if these symptoms are mild or severe before deciding upon postop HRT.  We discussed the role of hysterectomy at the time of BSO if the ovaries are benign. She will consider this and its additional risks and recovery and notify me preoperatively if she is interested in this addition to the surgical plan.  HPI: 49 year old Afro-American female gravida 2 seen in consultation at the request of Dr. Everett Hess regarding management of an ovarian cyst and an elevated CA-125 and OVA1. The patient reports she had an ovarian cyst found at the time of IUD removal approximately a year ago. A follow-up ultrasound on 05/07/2014 of the uterus with a 1 cm uterine fibroid and a 3 x 2.4 x 2.4 cm cystic mass with fine internal echoes in the left ovary and a right ovary with a 3.8 x 2.6 x 2.7 cm mildly complex cyst with internal echoes and wall projections. There is no free pelvic fluid. CA-125 value was 278 units per mL and OVA1 was 8.9. Statistically, based on the OVA1 result, the patient has approximate 70% chance of having ovarian cancer. She subsequent had a CT scan that is essentially normal. The patient does note some low-grade pelvic discomfort and bloating for the past several months. She has no other constitutional symptoms.  The  patient is had 2 prior cesarean sections but no other abdominal or pelvic surgery. She has a great grandmother who had ovarian cancer. However there were no other women in the family with breast ovarian endometrial or colon cancer.  Review of Systems:10 point review of systems is negative except as noted in interval history.   Vitals: Blood pressure 143/92, pulse 95, temperature 98.5 F (36.9 C), temperature source Oral, resp. rate 16, height 5' 7.5" (1.715 m), weight 203 lb 6.4 oz (92.262 kg), last menstrual period 05/16/2014.  Physical Exam: General : The patient is a healthy woman in no acute distress.  HEENT: normocephalic, extraoccular movements normal; neck is supple without thyromegally  Lynphnodes: Supraclavicular and inguinal nodes not enlarged  Abdomen: Soft, non-tender, no ascites, no organomegally, no masses, no hernias  Pelvic:  EGBUS: Normal female  Vagina: Normal, no lesions  Urethra and Bladder: Normal, non-tender  Cervix: Normal there are no lesions Uterus: Anterior, normal shape size and consistency Bi-manual examination: Non-tender; no adenxal masses or nodularity  Rectal: normal sphincter tone, no masses, no blood  Lower extremities: No edema or varicosities. Normal range of motion      Allergies  Allergen Reactions  . Ace Inhibitors Cough  . Hctz [Hydrochlorothiazide] Cough    Past Medical History  Diagnosis Date  . Hypertension   . Diabetes mellitus without complication   . Adrenal adenoma   . Hyperaldosteronism     Possibly  . Hypokalemia   . Hyperlipidemia   . Allergy   . Anemia   . Headache     MIGRAINES  .  GERD (gastroesophageal reflux disease)   . Ovarian cyst     Past Surgical History  Procedure Laterality Date  . Cesarean section      X 2  . Cardiac catheterization    . Tubal ligation      Current Outpatient Prescriptions  Medication Sig Dispense Refill  . carvedilol (COREG) 6.25 MG tablet Take 1 tablet (6.25 mg total) by mouth 2  (two) times daily with a meal. (Patient taking differently: Take 6.25 mg by mouth at bedtime. ) 180 tablet 3  . Cholecalciferol (VITAMIN D PO) Take 50,000 Units by mouth daily.     . Cyanocobalamin (B-12 PO) Take 1 tablet by mouth daily.    . INVOKANA 300 MG TABS tablet 300 mg daily before breakfast.     . Iron TABS Take 1 tablet by mouth daily.    Marland Kitchen losartan (COZAAR) 100 MG tablet Take 50 mg by mouth at bedtime.     . pantoprazole (PROTONIX) 40 MG tablet Take 1 tablet (40 mg total) by mouth daily. 30 tablet 0  . spironolactone (ALDACTONE) 100 MG tablet Take 100 mg by mouth at bedtime.    Marland Kitchen aspirin 81 MG tablet Take 81 mg by mouth daily.     No current facility-administered medications for this visit.    History   Social History  . Marital Status: Married    Spouse Name: N/A    Number of Children: N/A  . Years of Education: N/A   Occupational History  . Not on file.   Social History Main Topics  . Smoking status: Never Smoker   . Smokeless tobacco: Not on file  . Alcohol Use: No  . Drug Use: No  . Sexual Activity: Not on file   Other Topics Concern  . Not on file   Social History Narrative    Family History  Problem Relation Age of Onset  . Diabetes Father   . Hepatitis C Father   . Stroke Mother   . Diabetes Mother   . Stroke Brother       Kristin Eva, MD 06/12/2014, 1:42 PM

## 2014-06-17 ENCOUNTER — Ambulatory Visit (HOSPITAL_COMMUNITY): Payer: Managed Care, Other (non HMO)

## 2014-06-17 ENCOUNTER — Encounter (HOSPITAL_COMMUNITY): Payer: Self-pay | Admitting: Gynecologic Oncology

## 2014-06-17 DIAGNOSIS — N8320 Unspecified ovarian cysts: Secondary | ICD-10-CM | POA: Diagnosis not present

## 2014-06-17 LAB — CBC WITH DIFFERENTIAL/PLATELET
Basophils Absolute: 0 10*3/uL (ref 0.0–0.1)
Basophils Relative: 0 % (ref 0–1)
EOS PCT: 0 % (ref 0–5)
Eosinophils Absolute: 0 10*3/uL (ref 0.0–0.7)
HCT: 39.5 % (ref 36.0–46.0)
Hemoglobin: 12.5 g/dL (ref 12.0–15.0)
LYMPHS ABS: 1.7 10*3/uL (ref 0.7–4.0)
LYMPHS PCT: 15 % (ref 12–46)
MCH: 27.1 pg (ref 26.0–34.0)
MCHC: 31.6 g/dL (ref 30.0–36.0)
MCV: 85.7 fL (ref 78.0–100.0)
MONO ABS: 0.9 10*3/uL (ref 0.1–1.0)
Monocytes Relative: 8 % (ref 3–12)
Neutro Abs: 8.8 10*3/uL — ABNORMAL HIGH (ref 1.7–7.7)
Neutrophils Relative %: 77 % (ref 43–77)
Platelets: 171 10*3/uL (ref 150–400)
RBC: 4.61 MIL/uL (ref 3.87–5.11)
RDW: 13.3 % (ref 11.5–15.5)
WBC: 11.5 10*3/uL — AB (ref 4.0–10.5)

## 2014-06-17 LAB — CBC
HCT: 37.8 % (ref 36.0–46.0)
HEMOGLOBIN: 12.1 g/dL (ref 12.0–15.0)
MCH: 27.1 pg (ref 26.0–34.0)
MCHC: 32 g/dL (ref 30.0–36.0)
MCV: 84.8 fL (ref 78.0–100.0)
Platelets: 152 10*3/uL (ref 150–400)
RBC: 4.46 MIL/uL (ref 3.87–5.11)
RDW: 13.2 % (ref 11.5–15.5)
WBC: 10.9 10*3/uL — ABNORMAL HIGH (ref 4.0–10.5)

## 2014-06-17 LAB — BASIC METABOLIC PANEL
Anion gap: 6 (ref 5–15)
Anion gap: 9 (ref 5–15)
BUN: 8 mg/dL (ref 6–23)
BUN: 9 mg/dL (ref 6–23)
CALCIUM: 9.4 mg/dL (ref 8.4–10.5)
CO2: 26 mmol/L (ref 19–32)
CO2: 24 mmol/L (ref 19–32)
CREATININE: 0.81 mg/dL (ref 0.50–1.10)
CREATININE: 0.89 mg/dL (ref 0.50–1.10)
Calcium: 8.9 mg/dL (ref 8.4–10.5)
Chloride: 102 mEq/L (ref 96–112)
Chloride: 99 mEq/L (ref 96–112)
GFR calc Af Amer: >90 mL/min (ref >90)
GFR calc Af Amer: 87 mL/min — ABNORMAL LOW (ref >90)
GFR calc non Af Amer: 75 mL/min — ABNORMAL LOW (ref >90)
GFR, EST NON AFRICAN AMERICAN: 84 mL/min — AB (ref >90)
GLUCOSE: 128 mg/dL — AB (ref 70–99)
Glucose, Bld: 251 mg/dL — ABNORMAL HIGH (ref 70–99)
Potassium: 4.2 mmol/L (ref 3.5–5.1)
Potassium: 4.4 mmol/L (ref 3.5–5.1)
Sodium: 134 mmol/L — ABNORMAL LOW (ref 135–145)
Sodium: 132 mmol/L — ABNORMAL LOW (ref 135–145)

## 2014-06-17 LAB — GLUCOSE, CAPILLARY
GLUCOSE-CAPILLARY: 106 mg/dL — AB (ref 70–99)
Glucose-Capillary: 219 mg/dL — ABNORMAL HIGH (ref 70–99)
Glucose-Capillary: 193 mg/dL — ABNORMAL HIGH (ref 70–99)
Glucose-Capillary: 148 mg/dL — ABNORMAL HIGH (ref 70–99)

## 2014-06-17 MED ORDER — IOHEXOL 300 MG/ML  SOLN
25.0000 mL | INTRAMUSCULAR | Status: AC
  Administered 2014-06-17: 25 mL via ORAL

## 2014-06-17 MED ORDER — OXYCODONE-ACETAMINOPHEN 5-325 MG PO TABS: 1.0000 | ORAL_TABLET | ORAL | Status: DC | PRN

## 2014-06-17 MED ORDER — INSULIN ASPART 100 UNIT/ML ~~LOC~~ SOLN
0.0000 [IU] | Freq: Four times a day (QID) | SUBCUTANEOUS | Status: DC
  Administered 2014-06-18: 3 [IU] via SUBCUTANEOUS
  Administered 2014-06-18: 2 [IU] via SUBCUTANEOUS

## 2014-06-17 MED ORDER — KCL IN DEXTROSE-NACL 20-5-0.45 MEQ/L-%-% IV SOLN
INTRAVENOUS | Status: DC
  Filled 2014-06-17 (×3): qty 1000

## 2014-06-17 MED ORDER — IOHEXOL 300 MG/ML  SOLN
100.0000 mL | Freq: Once | INTRAMUSCULAR | Status: AC | PRN
  Administered 2014-06-17: 100 mL via INTRAVENOUS

## 2014-06-17 NOTE — Progress Notes (Signed)
1 Day Post-Op Procedure(s) (LRB): ROBOTIC ASSISTED TOTAL HYSTERECTOMY WITH BILATERAL SALPINGO OOPHORECTOMY, LYSIS OF ADHESIONS (Bilateral)  Subjective: Patient reports abdominal cramping and gas pains intermittently that resolves with pain medication.  Ambulating without difficulty.  Tolerating liquids but has not taken in solid food.  Ordered regular food for breakfast.  Mild nausea last pm that has resolved.  No emesis.  Denies chest pain, dyspnea, passing flatus, or having a bowel movement.  No concerns voiced.       Objective: Vital signs in last 24 hours: Temp:  [97.6 F (36.4 C)-99.5 F (37.5 C)] 98.6 F (37 C) (01/13 0559) Pulse Rate:  [69-91] 86 (01/13 0559) Resp:  [18] 18 (01/13 0559) BP: (104-140)/(68-84) 110/72 mmHg (01/13 0559) SpO2:  [95 %-100 %] 97 % (01/13 0559) Last BM Date: 06/15/14  Intake/Output from previous day: 01/12 0701 - 01/13 0700 In: 4003.8 [P.O.:960; I.V.:3043.8] Out: 3400 [Urine:3325; Blood:75]  Physical Examination: General: alert, cooperative and no distress Resp: clear to auscultation bilaterally Cardio: regular rate and rhythm, S1, S2 normal, no murmur, click, rub or gallop GI: incision: lap sites with dermabond without erythema or drainage and abdomen distended, tympanic on percussion, faint bowel sounds, soft Extremities: extremities normal, atraumatic, no cyanosis or edema  Labs: WBC/Hgb/Hct/Plts:  10.9/12.1/37.8/152 (01/13 0500) BUN/Cr/glu/ALT/AST/amyl/lip:  8/0.81/--/--/--/--/-- (01/13 0500)  Assessment: 49 y.o. s/p Procedure(s): ROBOTIC ASSISTED TOTAL HYSTERECTOMY WITH BILATERAL SALPINGO OOPHORECTOMY, LYSIS OF ADHESIONS: stable Pain:  Pain is well-controlled on PRN medications.  Heme: Hgb 12.1 and Hct 37.8 this am.  Stable post-operatively.  CV:  BP and HR stable post-operatively.  Continue to monitor with ordered vital signs.  GI:  Tolerating po: Yes, liquids at this time.  GU: Adequate output reported.    FEN: Stable  post-operatively.  Endo: Diabetes mellitus Type II, under fair control..  CBG:  CBG (last 3)   Recent Labs  06/16/14 2136 06/17/14 0711 06/17/14 1152  GLUCAP 205* 219* 193*    Prophylaxis: intermittent pneumatic compression boots.  Plan: Abdomen 2 view to evaluate decreased bowel sounds and abd distention post-op Saline lock IV Plan for discharge after lunch if abd xray unremarkable, diet tolerated, pain controlled, and voiding without difficulty. Resume oral anti-diabetic medication Continue post-operative plan of care per Dr. Delsa Sale   LOS: 1 day    CROSS, MELISSA DEAL 06/17/2014, 1:41 PM

## 2014-06-17 NOTE — Discharge Instructions (Addendum)
06/17/2014  Return to work: 4-6 weeks if applicable  Activity: 1. Be up and out of the bed during the day.  Take a nap if needed.  You may walk up steps but be careful and use the hand rail.  Stair climbing will tire you more than you think, you may need to stop part way and rest.   2. No lifting or straining for 6 weeks.  3. No driving for 1 week(s).  Do not drive if you are taking narcotic pain medicine.  4. Shower daily.  Use soap and water on your incision and pat dry; don't rub.  No tub baths until cleared by your surgeon.   5. No sexual activity and nothing in the vagina for 8 weeks.  Diet: 1. Low sodium Heart Healthy Diet is recommended.  2. It is safe to use a laxative, such as Miralax or Colace, if you have difficulty moving your bowels.   Wound Care: 1. Keep clean and dry.  Shower daily.  Reasons to call the Doctor:  Fever - Oral temperature greater than 100.4 degrees Fahrenheit  Foul-smelling vaginal discharge  Difficulty urinating  Nausea and vomiting  Increased pain at the site of the incision that is unrelieved with pain medicine.  Difficulty breathing with or without chest pain  New calf pain especially if only on one side  Sudden, continuing increased vaginal bleeding with or without clots.   Contacts: For questions or concerns you should contact:  Dr. Lahoma Crocker at [phone]  Dr. Everitt Amber at [phone]  Joylene John, NP at [phone]  After Hours: call [phone] and ask for the GYN Oncologist on call  Abdominal Hysterectomy, Care After These instructions give you information on caring for yourself after your procedure. Your doctor may also give you more specific instructions. Call your doctor if you have any problems or questions after your procedure.  HOME CARE It takes 4-6 weeks to recover from this surgery. Follow all of your doctor's instructions.   Only take medicines as told by your doctor.  Change your bandage as  told by your doctor.  Take showers for 2-3 weeks. Ask your doctor when it is okay to shower.  Do not douche, use tampons, or have sex (intercourse) for 8 weeks.  Follow your doctor's advice about exercise, lifting objects, driving, and general activities.  Get plenty of rest and sleep.  Do not lift anything heavier than a gallon of milk (about 10 pounds [4.5 kilograms]) for the first month after surgery.  Get back to your normal diet as told by your doctor.  Do not drink alcohol until your doctor says it is okay.  Take a medicine to help you poop (laxative) as told by your doctor.  Eating foods high in fiber may help you poop. Eat a lot of raw fruits and vegetables, whole grains, and beans.  Drink enough fluids to keep your pee (urine) clear or pale yellow.  Have someone help you at home for 1-2 weeks after your surgery.  Keep follow-up doctor visits as told. GET HELP IF:  You have chills or fever.  You have puffiness, redness, or pain in area of the cut (incision).  You have yellowish-white fluid (pus) coming from the cut.  You have a bad smell coming from the cut or bandage.  Your cut pulls apart.  You feel dizzy or light-headed.  You have pain or bleeding when you pee.  You keep having watery poop (diarrhea).  You keep feeling sick to  your stomach (nauseous) or keep throwing up (vomiting).  You have fluid (discharge) coming from your vagina.  You have a rash.  You have a reaction to your medicine.  You need stronger pain medicine. GET HELP RIGHT AWAY IF:   You have a fever and your symptoms suddenly get worse.  You have bad belly (abdominal) pain.  You have chest pain.  You are short of breath.  You pass out (faint).  You have pain, puffiness, or redness of your leg.  You bleed a lot from your vagina and notice clumps of tissue (clots). MAKE SURE YOU:   Understand these instructions.  Will watch your condition.  Will get help right away if  you are not doing well or get worse. Document Released: 02/29/2008 Document Revised: 05/27/2013 Document Reviewed: 03/14/2013 [facility] Patient Information 2015 [place], [place]. This information is not intended to replace advice given to you by your health care provider. Make sure you discuss any questions you have with your health care provider.  Acetaminophen; Oxycodone tablets What is this medicine? ACETAMINOPHEN; OXYCODONE (a set a MEE noe fen; ox i KOE done) is a pain reliever. It is used to treat mild to moderate pain. This medicine may be used for other purposes; ask your health care provider or pharmacist if you have questions. COMMON BRAND NAME(S): Endocet, Magnacet, Narvox, Percocet, Perloxx, Primalev, Primlev, Roxicet, Xolox What should I tell my health care provider before I take this medicine? They need to know if you have any of these conditions: -brain tumor -Crohn's disease, inflammatory bowel disease, or ulcerative colitis -drug abuse or addiction -head injury -heart or circulation problems -if you often drink alcohol -kidney disease or problems going to the bathroom -liver disease -lung disease, asthma, or breathing problems -an unusual or allergic reaction to acetaminophen, oxycodone, other opioid analgesics, other medicines, foods, dyes, or preservatives -pregnant or trying to get pregnant -breast-feeding How should I use this medicine? Take this medicine by mouth with a full glass of water. Follow the directions on the prescription label. Take your medicine at regular intervals. Do not take your medicine more often than directed. Talk to your pediatrician regarding the use of this medicine in children. Special care may be needed. Patients over 44 years old may have a stronger reaction and need a smaller dose. Overdosage: If you think you have taken too much of this medicine contact a poison control center or emergency room at once. NOTE: This medicine is only for you.  Do not share this medicine with others. What if I miss a dose? If you miss a dose, take it as soon as you can. If it is almost time for your next dose, take only that dose. Do not take double or extra doses. What may interact with this medicine? -alcohol -antihistamines -barbiturates like amobarbital, butalbital, butabarbital, methohexital, pentobarbital, phenobarbital, thiopental, and secobarbital -benztropine -drugs for bladder problems like solifenacin, trospium, oxybutynin, tolterodine, hyoscyamine, and methscopolamine -drugs for breathing problems like ipratropium and tiotropium -drugs for certain stomach or intestine problems like propantheline, homatropine methylbromide, glycopyrrolate, atropine, belladonna, and dicyclomine -general anesthetics like etomidate, ketamine, nitrous oxide, propofol, desflurane, enflurane, halothane, isoflurane, and sevoflurane -medicines for depression, anxiety, or psychotic disturbances -medicines for sleep -muscle relaxants -naltrexone -narcotic medicines (opiates) for pain -phenothiazines like perphenazine, thioridazine, chlorpromazine, mesoridazine, fluphenazine, prochlorperazine, promazine, and trifluoperazine -scopolamine -tramadol -trihexyphenidyl This list may not describe all possible interactions. Give your health care provider a list of all the medicines, herbs, non-prescription drugs, or dietary supplements you  use. Also tell them if you smoke, drink alcohol, or use illegal drugs. Some items may interact with your medicine. What should I watch for while using this medicine? Tell your doctor or health care professional if your pain does not go away, if it gets worse, or if you have new or a different type of pain. You may develop tolerance to the medicine. Tolerance means that you will need a higher dose of the medication for pain relief. Tolerance is normal and is expected if you take this medicine for a long time. Do not suddenly stop taking  your medicine because you may develop a severe reaction. Your body becomes used to the medicine. This does NOT mean you are addicted. Addiction is a behavior related to getting and using a drug for a non-medical reason. If you have pain, you have a medical reason to take pain medicine. Your doctor will tell you how much medicine to take. If your doctor wants you to stop the medicine, the dose will be slowly lowered over time to avoid any side effects. You may get drowsy or dizzy. Do not drive, use machinery, or do anything that needs mental alertness until you know how this medicine affects you. Do not stand or sit up quickly, especially if you are an older patient. This reduces the risk of dizzy or fainting spells. Alcohol may interfere with the effect of this medicine. Avoid alcoholic drinks. There are different types of narcotic medicines (opiates) for pain. If you take more than one type at the same time, you may have more side effects. Give your health care provider a list of all medicines you use. Your doctor will tell you how much medicine to take. Do not take more medicine than directed. Call emergency for help if you have problems breathing. The medicine will cause constipation. Try to have a bowel movement at least every 2 to 3 days. If you do not have a bowel movement for 3 days, call your doctor or health care professional. Do not take Tylenol (acetaminophen) or medicines that have acetaminophen with this medicine. Too much acetaminophen can be very dangerous. Many nonprescription medicines contain acetaminophen. Always read the labels carefully to avoid taking more acetaminophen. What side effects may I notice from receiving this medicine? Side effects that you should report to your doctor or health care professional as soon as possible: -allergic reactions like skin rash, itching or hives, swelling of the face, lips, or tongue -breathing difficulties, wheezing -confusion -light headedness or  fainting spells -severe stomach pain -unusually weak or tired -yellowing of the skin or the whites of the eyes Side effects that usually do not require medical attention (report to your doctor or health care professional if they continue or are bothersome): -dizziness -drowsiness -nausea -vomiting This list may not describe all possible side effects. Call your doctor for medical advice about side effects. You may report side effects to FDA at 1-800-FDA-1088. Where should I keep my medicine? Keep out of the reach of children. This medicine can be abused. Keep your medicine in a safe place to protect it from theft. Do not share this medicine with anyone. Selling or giving away this medicine is dangerous and against the law. Store at room temperature between 20 and 25 degrees C (68 and 77 degrees F). Keep container tightly closed. Protect from light. This medicine may cause accidental overdose and death if it is taken by other adults, children, or pets. Flush any unused medicine down the toilet  to reduce the chance of harm. Do not use the medicine after the expiration date. NOTE: This sheet is a summary. It may not cover all possible information. If you have questions about this medicine, talk to your doctor, pharmacist, or health care provider.  2015, [employer]. (2013-01-13 13:17:35)

## 2014-06-17 NOTE — Discharge Summary (Signed)
Physician Discharge Summary  Patient ID: Kristin Hess MRN: [id number] DOB/AGE: [birth year] 49 y.o.  Admit date: 06/16/2014 Discharge date: 06/17/2014  Admission Diagnoses: Bilateral ovarian cysts  Discharge Diagnoses:  Principal Problem:   Bilateral ovarian cysts Active Problems:   Endometriosis of rectovaginal septum   Endometriosis   Discharged Condition:  The patient is in good condition and stable for discharge.    Hospital Course: On 06/16/2014, the patient underwent the following: Procedure(s): ROBOTIC ASSISTED TOTAL HYSTERECTOMY WITH BILATERAL SALPINGO OOPHORECTOMY, LYSIS OF ADHESIONS.  The postoperative course was uneventful.  An abdomen 2 view was obtained prior to discharge due to mild abdominal distention and hypoactive bowel sounds.  She was discharged to home on postoperative day 1 tolerating a regular diet with pain controlled.  Consults: None  Significant Diagnostic Studies: Abdomen 2 View  Treatments: surgery: see above  Discharge Exam: Blood pressure 110/72, pulse 86, temperature 98.6 F (37 C), temperature source Oral, resp. rate 18, height 5\' 7"  (1.702 m), weight 203 lb (92.08 kg), SpO2 97 %. General appearance: alert, cooperative and no distress Resp: clear to auscultation bilaterally Cardio: regular rate and rhythm, S1, S2 normal, no murmur, click, rub or gallop GI: abdomen soft, mildly distended, hypoactive bowel sounds, tympanic on percussion Extremities: extremities normal, atraumatic, no cyanosis or edema Incision/Wound: Lap sites to the abdomen with dermabond without erythema or drainage  Disposition:  Home   Discharge Instructions    Call MD for:  difficulty breathing, headache or visual disturbances    Complete by:  As directed      Call MD for:  extreme fatigue    Complete by:  As directed      Call MD for:  hives    Complete by:  As directed      Call MD for:  persistant dizziness or light-headedness    Complete by:  As directed      Call MD for:  persistant nausea and vomiting    Complete by:  As directed      Call MD for:  redness, tenderness, or signs of infection (pain, swelling, redness, odor or green/yellow discharge around incision site)    Complete by:  As directed      Call MD for:  severe uncontrolled pain    Complete by:  As directed      Call MD for:  temperature >100.4    Complete by:  As directed      Diet - low sodium heart healthy    Complete by:  As directed      Driving Restrictions    Complete by:  As directed   No driving for 1 week.  Do not take narcotics and drive.     Increase activity slowly    Complete by:  As directed      Lifting restrictions    Complete by:  As directed   No lifting greater than 10 lbs.     Sexual Activity Restrictions    Complete by:  As directed   No sexual activity, nothing in the vagina, for 8 weeks.            Medication List    TAKE these medications        aspirin 81 MG tablet  Take 81 mg by mouth daily.     B-12 PO  Take 1 tablet by mouth daily.     carvedilol 6.25 MG tablet  Commonly known as:  COREG  Take 1 tablet (6.25 mg total) by  mouth 2 (two) times daily with a meal.     INVOKANA 300 MG Tabs tablet  Generic drug:  canagliflozin  300 mg daily before breakfast.     Iron Tabs  Take 1 tablet by mouth daily.     losartan 100 MG tablet  Commonly known as:  COZAAR  Take 50 mg by mouth at bedtime.     oxyCODONE-acetaminophen 5-325 MG per tablet  Commonly known as:  PERCOCET/ROXICET  Take 1-2 tablets by mouth every 4 (four) hours as needed for severe pain (moderate to severe pain).     pantoprazole 40 MG tablet  Commonly known as:  PROTONIX  Take 1 tablet (40 mg total) by mouth daily.     spironolactone 100 MG tablet  Commonly known as:  ALDACTONE  Take 100 mg by mouth at bedtime.     VITAMIN D PO  Take 50,000 Units by mouth daily.           Follow-up Information    Follow up with Donaciano Eva, MD On 07/06/2014.    Specialty:  Obstetrics and Gynecology   Why:  at 10:45am at the [facility] information:   [place] [address] [phone]       Greater than thirty minutes were spend for face to face discharge instructions and discharge orders/summary in EPIC.   Signed: CROSS, MELISSA DEAL 06/17/2014, 10:03 AM

## 2014-06-17 NOTE — Progress Notes (Addendum)
Abdomen 2 view ordered this am due to abdominal distention and faint bowel sounds post-operatively.  Mild nausea resolved per pt at that time.  No emesis.  Abdominal cramping reported which was relieved by pain medication.  Plans were for discharge this afternoon if xray unremarkable, voiding without difficulty, pain controlled and diet tolerated.    Abdomen 2 View Results: Mild gaseous distended small bowel loops in left mid abdomen suspicious for ileus or partial small bowel obstruction. Moderate gaseous distension of hepatic flexure of the colon.  Dr. Delsa Sale informed of results.  Patient made NPO and repeat labs for this afternoon ordered along with a CT scan of the abdomen and pelvis with contrast.  Patient called in the room and results/situation discussed.  Agreeable with proceeding with the scan.  Reporting pain controlled with PO percocet, voiding without difficulty, no flatus, and lightheadedness at times when getting out of bed.  No other concerns voiced.  Discharge on hold at this time.

## 2014-06-18 DIAGNOSIS — N8320 Unspecified ovarian cysts: Secondary | ICD-10-CM | POA: Diagnosis not present

## 2014-06-18 LAB — GLUCOSE, CAPILLARY
GLUCOSE-CAPILLARY: 93 mg/dL (ref 70–99)
Glucose-Capillary: 130 mg/dL — ABNORMAL HIGH (ref 70–99)
Glucose-Capillary: 170 mg/dL — ABNORMAL HIGH (ref 70–99)

## 2014-06-18 LAB — BASIC METABOLIC PANEL
Anion gap: 12 (ref 5–15)
BUN: 11 mg/dL (ref 6–23)
CALCIUM: 9.4 mg/dL (ref 8.4–10.5)
CO2: 24 mmol/L (ref 19–32)
Chloride: 102 mEq/L (ref 96–112)
Creatinine, Ser: 0.84 mg/dL (ref 0.50–1.10)
GFR calc Af Amer: >90 mL/min (ref >90)
GFR, EST NON AFRICAN AMERICAN: 80 mL/min — AB (ref >90)
Glucose, Bld: 154 mg/dL — ABNORMAL HIGH (ref 70–99)
Potassium: 4 mmol/L (ref 3.5–5.1)
SODIUM: 138 mmol/L (ref 135–145)

## 2014-06-18 LAB — CBC
HEMATOCRIT: 40 % (ref 36.0–46.0)
Hemoglobin: 12.8 g/dL (ref 12.0–15.0)
MCH: 27.2 pg (ref 26.0–34.0)
MCHC: 32 g/dL (ref 30.0–36.0)
MCV: 84.9 fL (ref 78.0–100.0)
Platelets: 168 10*3/uL (ref 150–400)
RBC: 4.71 MIL/uL (ref 3.87–5.11)
RDW: 13.2 % (ref 11.5–15.5)
WBC: 10.3 10*3/uL (ref 4.0–10.5)

## 2014-06-18 MED ORDER — OXYCODONE-ACETAMINOPHEN 5-325 MG PO TABS: 1.0000 | ORAL_TABLET | ORAL | Status: DC | PRN

## 2014-06-18 MED ORDER — POLYETHYLENE GLYCOL 3350 17 GM/SCOOP PO POWD: 1.0000 | Freq: Every day | ORAL | Status: DC

## 2014-06-18 MED ORDER — DOCUSATE SODIUM 100 MG PO CAPS: 100.0000 mg | ORAL_CAPSULE | Freq: Two times a day (BID) | ORAL | Status: DC

## 2014-06-18 MED ORDER — INSULIN ASPART 100 UNIT/ML ~~LOC~~ SOLN: 0.0000 [IU] | Freq: Three times a day (TID) | SUBCUTANEOUS | Status: DC

## 2014-06-18 NOTE — Discharge Summary (Signed)
Physician Discharge Summary  Patient ID: Kristin Hess MRN: [id number] DOB/AGE: Nov 22, 1965 49 y.o.  Admit date: 06/16/2014 Discharge date: 06/18/2014  Admission Diagnoses: Bilateral ovarian cysts  Discharge Diagnoses:  Principal Problem:   Bilateral ovarian cysts Active Problems:   Endometriosis of rectovaginal septum   Endometriosis   Discharged Condition: good  Hospital Course: patient was admitted for surgery on 06/16/14 and underwent a robotic hysterectomy, BSO and lysis of adhesions for stage IV endometriosis. Surgery was uncomplicated. On postop day 1 she had abdominal distension and an x-ray that revealed air fluid levels. Due to her complex surgery and adhesions, a CT of the abdomen and pelvis was ordered to rule out visceral injury. The CT was unremarkable. Her labs were normal and appropriate. Her glucose was controlled well with a sliding scale. She was meeting discharge criteria on POD 2.  Consults: None  Significant Diagnostic Studies: labs: normal Hb postop and normal WBC and radiology: CT scan: stool in colon, redundant sigmoid colon, no free air or fluid  Treatments: surgery: see above  Discharge Exam: Blood pressure 127/83, pulse 97, temperature 99.2 F (37.3 C), temperature source Oral, resp. rate 18, height 5\' 7"  (1.702 m), weight 203 lb (92.08 kg), SpO2 98 %. General appearance: alert and cooperative Resp: clear to auscultation bilaterally GI: soft, non-tender; bowel sounds normal; no masses,  no organomegaly and incisions clean, dry and intact Pelvic: vagina normal without discharge Extremities: extremities normal, atraumatic, no cyanosis or edema  Disposition:   Discharge Instructions    Call MD for:  difficulty breathing, headache or visual disturbances    Complete by:  As directed      Call MD for:  extreme fatigue    Complete by:  As directed      Call MD for:  hives    Complete by:  As directed      Call MD for:  persistant dizziness or  light-headedness    Complete by:  As directed      Call MD for:  persistant nausea and vomiting    Complete by:  As directed      Call MD for:  redness, tenderness, or signs of infection (pain, swelling, redness, odor or green/yellow discharge around incision site)    Complete by:  As directed      Call MD for:  severe uncontrolled pain    Complete by:  As directed      Call MD for:  temperature >100.4    Complete by:  As directed      Diet - low sodium heart healthy    Complete by:  As directed      Driving Restrictions    Complete by:  As directed   No driving for 1 week.  Do not take narcotics and drive.     Increase activity slowly    Complete by:  As directed      Lifting restrictions    Complete by:  As directed   No lifting greater than 10 lbs.     Sexual Activity Restrictions    Complete by:  As directed   No sexual activity, nothing in the vagina, for 8 weeks.            Medication List    TAKE these medications        aspirin 81 MG tablet  Take 81 mg by mouth daily.     B-12 PO  Take 1 tablet by mouth daily.     carvedilol 6.25  MG tablet  Commonly known as:  COREG  Take 1 tablet (6.25 mg total) by mouth 2 (two) times daily with a meal.     docusate sodium 100 MG capsule  Commonly known as:  COLACE  Take 1 capsule (100 mg total) by mouth 2 (two) times daily.     INVOKANA 300 MG Tabs tablet  Generic drug:  canagliflozin  300 mg daily before breakfast.     Iron Tabs  Take 1 tablet by mouth daily.     losartan 100 MG tablet  Commonly known as:  COZAAR  Take 50 mg by mouth at bedtime.     oxyCODONE-acetaminophen 5-325 MG per tablet  Commonly known as:  PERCOCET/ROXICET  Take 1-2 tablets by mouth every 4 (four) hours as needed for severe pain (moderate to severe pain).     oxyCODONE-acetaminophen 5-325 MG per tablet  Commonly known as:  PERCOCET  Take 1-2 tablets by mouth every 4 (four) hours as needed for severe pain.     pantoprazole 40 MG tablet   Commonly known as:  PROTONIX  Take 1 tablet (40 mg total) by mouth daily.     polyethylene glycol powder powder  Commonly known as:  MIRALAX  Take 255 g by mouth daily.     spironolactone 100 MG tablet  Commonly known as:  ALDACTONE  Take 100 mg by mouth at bedtime.     VITAMIN D PO  Take 50,000 Units by mouth daily.           Follow-up Information    Follow up with Donaciano Eva, MD On 07/06/2014.   Specialty:  Obstetrics and Gynecology   Why:  at 10:45am at the [facility] information:   [place] [address] [phone]       Signed: Donaciano Eva 06/18/2014, 10:33 AM

## 2014-06-18 NOTE — Progress Notes (Signed)
2 Days Post-Op Procedure(s) (LRB): ROBOTIC ASSISTED TOTAL HYSTERECTOMY WITH BILATERAL SALPINGO OOPHORECTOMY, LYSIS OF ADHESIONS (Bilateral)  Subjective: Patient reports mild improvement in abdominal cramping and gas pains.  Passing flatus and reports feeling less distended.  Pain controlled with PO percocet.  Voiding without difficulty.  Ambulating without difficulty.  Tolerating sips of liquids with no nausea or emesis.  Denies chest pain, dyspnea, or having a bowel movement.  No concerns voiced.       Objective: Vital signs in last 24 hours: Temp:  [98.1 F (36.7 C)-99.2 F (37.3 C)] 99.2 F (37.3 C) (01/14 0600) Pulse Rate:  [77-97] 97 (01/14 0600) Resp:  [18-19] 18 (01/14 0600) BP: (108-127)/(65-83) 127/83 mmHg (01/14 0600) SpO2:  [96 %-99 %] 98 % (01/14 0600) Last BM Date: 06/15/14  Intake/Output from previous day: 01/13 0701 - 01/14 0700 In: 50 [P.O.:290; I.V.:300] Out: 2775 [Urine:2775]  Physical Examination: General: alert, cooperative and no distress Resp: clear to auscultation bilaterally Cardio: regular rate and rhythm, S1, S2 normal, no murmur, click, rub or gallop GI: incision: lap sites with dermabond without erythema or drainage and abdomen distended more on the right, less tympanic on percussion compared with 06/17/13, active bowel sounds, soft Extremities: extremities normal, atraumatic, no cyanosis or edema  Labs: WBC/Hgb/Hct/Plts:  10.3/12.8/40.0/168 (01/14 0545) BUN/Cr/glu/ALT/AST/amyl/lip:  11/0.84/--/--/--/--/-- (01/14 0545)   CT: IMPRESSION: 1. Post recent hysterectomy without postoperative complication. No pelvic fluid collection. 2. No bowel obstruction. The air-filled bowel loops on prior radiograph corresponded to redundant sigmoid colon. There is moderate stool in the distal colon.  Assessment: 49 y.o. s/p Procedure(s): ROBOTIC ASSISTED TOTAL HYSTERECTOMY WITH BILATERAL SALPINGO OOPHORECTOMY, LYSIS OF ADHESIONS: stable Pain:  Pain is  well-controlled on PRN medications.  Heme: Hgb 12.8 and Hct 40.0 this am.  Stable post-operatively.  CV:  BP and HR stable post-operatively.  Continue to monitor with ordered vital signs.  GI:  Tolerating po: Yes, sips of liquids at this time.  GU: Adequate output reported.    FEN: Stable post-operatively.  Endo: Diabetes mellitus Type II, under fair control..  CBG:  CBG (last 3)   Recent Labs  06/17/14 2218 06/18/14 0038 06/18/14 0517  GLUCAP 148* 170* 130*    Prophylaxis: intermittent pneumatic compression boots.  Plan: Carb Mod diet this am Continue post-operative plan of care per Dr. Denman George   LOS: 2 days    CROSS, MELISSA DEAL 06/18/2014, 9:24 AM      I personally saw and examined the patient and reviewed last night's CT scan (including looking at the images). [place] is doing much better today. Her Hb is stable and appropriate. The CT showed constipation but no free fluid or air and no evidence of visceral injury. She has minimal pain and is tolerating PO. Her abdomen is soft, minimally distended. Incisions are in tact. She is meeting discharge criteria. Home today, followup in 4 weeks.  Estrogen prescription prn as an outpatient based on development of symptoms or not. Miralax and colace for constipation.  Donaciano Eva, MD

## 2014-06-18 NOTE — Progress Notes (Signed)
Nurse reviewed discharge instructions with pt.  Pt verbalized understanding of discharge instructions, new medications and follow up appointments. No concerns at time of discharge.

## 2014-06-30 ENCOUNTER — Other Ambulatory Visit: Payer: Self-pay | Admitting: *Deleted

## 2014-06-30 ENCOUNTER — Telehealth: Payer: Self-pay | Admitting: Gynecologic Oncology

## 2014-06-30 DIAGNOSIS — Z1212 Encounter for screening for malignant neoplasm of rectum: Secondary | ICD-10-CM

## 2014-06-30 LAB — POC HEMOCCULT BLD/STL (HOME/3-CARD/SCREEN)
Card #2 Fecal Occult Blod, POC: NEGATIVE
Card #3 Fecal Occult Blood, POC: NEGATIVE
FECAL OCCULT BLD: NEGATIVE

## 2014-06-30 NOTE — Telephone Encounter (Signed)
Patient called with complaints of increased vaginal bleeding post-operatively.  "I had been having light spotting since surgery and had a lot more blood yesterday evening."  Described as bright red and a period amount.  "Not as much seen today." She also is having intermittent sharp abdominal pains at times.  Dr. Denman George informed of the situation with no new orders at this time.  The patient is to call in the am if the bleeding persists or worsens overnight.  Reportable signs and symptoms reviewed.  She is also advised to call for any questions or concerns.  Post-op restrictions reviewed.

## 2014-07-06 ENCOUNTER — Ambulatory Visit: Payer: Managed Care, Other (non HMO) | Attending: Gynecologic Oncology | Admitting: Gynecologic Oncology

## 2014-07-06 ENCOUNTER — Encounter: Payer: Self-pay | Admitting: Gynecologic Oncology

## 2014-07-06 VITALS — BP 121/82 | HR 78 | Temp 97.5°F | Resp 18 | Ht 67.0 in | Wt 198.7 lb

## 2014-07-06 DIAGNOSIS — Z48816 Encounter for surgical aftercare following surgery on the genitourinary system: Secondary | ICD-10-CM

## 2014-07-06 DIAGNOSIS — L988 Other specified disorders of the skin and subcutaneous tissue: Secondary | ICD-10-CM

## 2014-07-06 DIAGNOSIS — N99821 Postprocedural hemorrhage and hematoma of a genitourinary system organ or structure following other procedure: Secondary | ICD-10-CM

## 2014-07-06 DIAGNOSIS — Z90710 Acquired absence of both cervix and uterus: Secondary | ICD-10-CM | POA: Diagnosis not present

## 2014-07-06 DIAGNOSIS — Z4889 Encounter for other specified surgical aftercare: Secondary | ICD-10-CM | POA: Insufficient documentation

## 2014-07-06 DIAGNOSIS — IMO0002 Reserved for concepts with insufficient information to code with codable children: Secondary | ICD-10-CM

## 2014-07-06 DIAGNOSIS — N804 Endometriosis of rectovaginal septum, unspecified involvement of vagina: Secondary | ICD-10-CM

## 2014-07-06 DIAGNOSIS — N939 Abnormal uterine and vaginal bleeding, unspecified: Secondary | ICD-10-CM | POA: Insufficient documentation

## 2014-07-06 DIAGNOSIS — N83299 Other ovarian cyst, unspecified side: Secondary | ICD-10-CM

## 2014-07-06 NOTE — Patient Instructions (Signed)
Dr. Denman George wants to see you back in our clinic in 4 weeks. Please continue vaginal rest for 4 more weeks.

## 2014-07-06 NOTE — Progress Notes (Signed)
POSTOPERATIVE FOLLOWUP   HPI:  Kristin Hess is a 49 y.o. year old woman initially seen in consultation on 05/27/14 for a pelvic mass and elevated CA 125.  She then underwent a robotic hysterectomy, BSO, and lysis of adhesions on 01/11/97 without complications.  Intraoperatively she was noted to have extensive stage IV endometriosis and adhesions. Her postoperative course was uncomplicated.  Her final pathology revealed endometriosis.  She is seen today for a postoperative check and to discuss her pathology results and ongoing plan.  Since discharge from the hospital, she is feeling well.  She has improving appetite, normal bowel and bladder function, and pain controlled with minimal PO medication. She initially has vaginal spotting postop, but then in the past week developed bright red bleeding that stains the toilet water. She reports an episode of bleeding once a day.  Her hot flashes are mild, slightly more frequent than preop, but manageable per patient. She had declined postop HRT.   Review of systems: Constitutional:  She has no weight gain or weight loss. She has no fever or chills. Eyes: No blurred vision Ears, Nose, Mouth, Throat: No dizziness, headaches or changes in hearing. No mouth sores. Cardiovascular: No chest pain, palpitations or edema. Respiratory:  No shortness of breath, wheezing or cough Gastrointestinal: She has normal bowel movements without diarrhea or constipation. She denies any nausea or vomiting. She denies blood in her stool or heart burn. Genitourinary:  She denies pelvic pain, pelvic pressure or changes in her urinary function. She has no hematuria, dysuria, or incontinence. + vaginal bleeding, no vaginal discharge Musculoskeletal: Denies muscle weakness or joint pains.  Skin:  She has no skin changes, rashes or itching Neurological:  Denies dizziness or headaches. No neuropathy, no numbness or tingling. Psychiatric:  She denies depression or  anxiety. Hematologic/Lymphatic:   No easy bruising or bleeding   Physical Exam: Blood pressure 121/82, pulse 78, temperature 97.5 F (36.4 C), temperature source Oral, resp. rate 18, height 5\' 7"  (1.702 m), weight 198 lb 11.2 oz (90.13 kg), last menstrual period 05/16/2014. General: Well dressed, well nourished in no apparent distress.   HEENT:  Normocephalic and atraumatic, no lesions.  Extraocular muscles intact. Sclerae anicteric. Pupils equal, round, reactive. No mouth sores or ulcers. Thyroid is normal size, not nodular, midline. Skin:  No lesions or rashes. Breasts:  Soft, symmetric.  No skin or nipple changes.  No palpable LN or masses. Lungs:  Clear to auscultation bilaterally.  No wheezes. Cardiovascular:  Regular rate and rhythm.  No murmurs or rubs. Abdomen:  Soft, nontender, nondistended.  No palpable masses.  No hepatosplenomegaly.  No ascites. Normal bowel sounds.  No hernias.  Incisions are healing well without signs of infection Genitourinary: Normal EGBUS  Vaginal cuff intact. Blood is seen in vaginal vault. She has a friable cuff edge posteriorally that is visualized. It was cauterized with silver nitrate to make hemostatic.  On palpation there is no cul de sac fullness or defects in the cuff. Extremities: No cyanosis, clubbing or edema.  No calf tenderness or erythema. No palpable cords. Psychiatric: Mood and affect are appropriate. Neurological: Awake, alert and oriented x 3. Sensation is intact, no neuropathy.  Musculoskeletal: No pain, normal strength and range of motion.  Assessment:    49 y.o. year old with vaginal bleeding from a friable cuff.   S/p robotic hysterectomy, BSO on 06/16/14.   Plan: 1) Pathology reports reviewed today 2) Vaginal cuff bleeding - expect to lighten after cuff cautery. Informed  patient to report to [place] any additional bleeding (more than spotting). Discussed pelvic rest for 4 more weeks. Safe to return to work on 2/23 as planned.  3)  Return  to clinic in 4 weeks for repeat examination. Donaciano Eva, MD

## 2014-07-11 ENCOUNTER — Other Ambulatory Visit: Payer: Self-pay | Admitting: Physician Assistant

## 2014-08-01 ENCOUNTER — Encounter (HOSPITAL_COMMUNITY): Payer: Self-pay | Admitting: Emergency Medicine

## 2014-08-01 ENCOUNTER — Emergency Department (HOSPITAL_COMMUNITY): Payer: Managed Care, Other (non HMO)

## 2014-08-01 ENCOUNTER — Emergency Department (HOSPITAL_COMMUNITY)
Admission: EM | Admit: 2014-08-01 | Discharge: 2014-08-01 | Disposition: A | Discharge status: Home / Self Care | Payer: Managed Care, Other (non HMO) | Attending: Emergency Medicine | Admitting: Emergency Medicine

## 2014-08-01 DIAGNOSIS — R200 Anesthesia of skin: Secondary | ICD-10-CM | POA: Insufficient documentation

## 2014-08-01 DIAGNOSIS — E785 Hyperlipidemia, unspecified: Secondary | ICD-10-CM | POA: Insufficient documentation

## 2014-08-01 DIAGNOSIS — Z79899 Other long term (current) drug therapy: Secondary | ICD-10-CM | POA: Insufficient documentation

## 2014-08-01 DIAGNOSIS — K219 Gastro-esophageal reflux disease without esophagitis: Secondary | ICD-10-CM | POA: Diagnosis not present

## 2014-08-01 DIAGNOSIS — R739 Hyperglycemia, unspecified: Secondary | ICD-10-CM

## 2014-08-01 DIAGNOSIS — D649 Anemia, unspecified: Secondary | ICD-10-CM | POA: Diagnosis not present

## 2014-08-01 DIAGNOSIS — R42 Dizziness and giddiness: Secondary | ICD-10-CM | POA: Diagnosis present

## 2014-08-01 DIAGNOSIS — Z8742 Personal history of other diseases of the female genital tract: Secondary | ICD-10-CM | POA: Diagnosis not present

## 2014-08-01 DIAGNOSIS — Z7982 Long term (current) use of aspirin: Secondary | ICD-10-CM | POA: Diagnosis not present

## 2014-08-01 DIAGNOSIS — Z9889 Other specified postprocedural states: Secondary | ICD-10-CM | POA: Insufficient documentation

## 2014-08-01 DIAGNOSIS — R202 Paresthesia of skin: Secondary | ICD-10-CM

## 2014-08-01 DIAGNOSIS — E1165 Type 2 diabetes mellitus with hyperglycemia: Secondary | ICD-10-CM | POA: Insufficient documentation

## 2014-08-01 DIAGNOSIS — I10 Essential (primary) hypertension: Secondary | ICD-10-CM | POA: Insufficient documentation

## 2014-08-01 LAB — BASIC METABOLIC PANEL
ANION GAP: 9 (ref 5–15)
BUN: 14 mg/dL (ref 6–23)
CHLORIDE: 105 mmol/L (ref 96–112)
CO2: 23 mmol/L (ref 19–32)
Calcium: 9.8 mg/dL (ref 8.4–10.5)
Creatinine, Ser: 0.63 mg/dL (ref 0.50–1.10)
GFR calc Af Amer: >90 mL/min (ref >90)
Glucose, Bld: 267 mg/dL — ABNORMAL HIGH (ref 70–99)
POTASSIUM: 4.3 mmol/L (ref 3.5–5.1)
SODIUM: 137 mmol/L (ref 135–145)

## 2014-08-01 LAB — CBC
HEMATOCRIT: 41 % (ref 36.0–46.0)
Hemoglobin: 13.4 g/dL (ref 12.0–15.0)
MCH: 27.3 pg (ref 26.0–34.0)
MCHC: 32.7 g/dL (ref 30.0–36.0)
MCV: 83.5 fL (ref 78.0–100.0)
Platelets: 201 10*3/uL (ref 150–400)
RBC: 4.91 MIL/uL (ref 3.87–5.11)
RDW: 13.1 % (ref 11.5–15.5)
WBC: 4.7 10*3/uL (ref 4.0–10.5)

## 2014-08-01 LAB — URINE MICROSCOPIC-ADD ON

## 2014-08-01 LAB — URINALYSIS, ROUTINE W REFLEX MICROSCOPIC
Bilirubin Urine: NEGATIVE
Glucose, UA: 500 mg/dL — AB
Ketones, ur: NEGATIVE mg/dL
Nitrite: NEGATIVE
Protein, ur: NEGATIVE mg/dL
Specific Gravity, Urine: 1.024 (ref 1.005–1.030)
Urobilinogen, UA: 0.2 mg/dL (ref 0.0–1.0)
pH: 6 (ref 5.0–8.0)

## 2014-08-01 LAB — CBG MONITORING, ED: Glucose-Capillary: 272 mg/dL — ABNORMAL HIGH (ref 70–99)

## 2014-08-01 LAB — I-STAT TROPONIN, ED: TROPONIN I, POC: 0 ng/mL (ref 0.00–0.08)

## 2014-08-01 MED ORDER — SODIUM CHLORIDE 0.9 % IV BOLUS (SEPSIS): 1000.0000 mL | Freq: Once | INTRAVENOUS | Status: DC

## 2014-08-01 MED ORDER — GADOBENATE DIMEGLUMINE 529 MG/ML IV SOLN
18.0000 mL | Freq: Once | INTRAVENOUS | Status: AC | PRN
Start: 2014-08-01 — End: 2014-08-01
  Administered 2014-08-01: 18 mL via INTRAVENOUS

## 2014-08-01 MED ORDER — MECLIZINE HCL 25 MG PO TABS: 25.0000 mg | ORAL_TABLET | Freq: Three times a day (TID) | ORAL | Status: DC | PRN

## 2014-08-01 NOTE — ED Notes (Signed)
Patient transported to CT 

## 2014-08-01 NOTE — ED Notes (Signed)
Pt states that she had a total hysterectomy here in January and states that 3 wks ago she began having dizzy spells and tingling down lt arm.  States that when she woke up this morning it was worse than usual.  Denies syncope but had near syncopal episode.  Denies NVD.  Denies pain.

## 2014-08-01 NOTE — ED Provider Notes (Signed)
Pt Awakeed this morning with symptoms of vertigo and tingling in left arm. Denies any lightheadedness. Feeling of room spinning worse with changing positions improved with remaining still. No visual changes. Her husband reports that her blood sugar was greater than 300 this morning. Patient has been off of her diabetic medications for several days however has metformin at home that she's used in the past. On exam alert Glasgow Coma Score 15 no distress HEENT exam no facial asymmetry neurologic exam cranial nerves II through XII grossly intact DTRs symmetric bilaterally knee jerk ankle jerk and biceps was downgoing bilaterally gait normal Romberg normal pronator drift normal finger to nose normal Patient has had partial workup for multiple sclerosis in the past however did not follow up with [facility]. I suggestted that she follow-up. She'll also start her metformin again a follow-up with her primary care physician  Orlie Dakin, MD 08/01/14 1510

## 2014-08-01 NOTE — ED Provider Notes (Signed)
CSN: [id number]     Arrival date & time 08/01/14  9563 History   First MD Initiated Contact with Patient 08/01/14 0920     Chief Complaint  Patient presents with  . Dizziness  . Tingling     (Consider location/radiation/quality/duration/timing/severity/associated sxs/prior Treatment) HPI Kristin Hess is a 49 y.o. female who presents to ED with complaint of dizziness and tingling over left arm. Pt states her dizziness has come and gone for about 3 weeks. States started after her hysterectomy. States dizziness feels like "room is spinning." Reports episodes last only few min at a time, mainly when she wakes up in the morning and first gets up. states today has had more severe and more than usual episodes which made her concerned. Pt also reports tingling sensation down her left arm that started this morning and has been constant since. She reports stopping her invokana 3 wks ago because of side effects. Currently not on anything for DM. Pt denies headache. No visual chagnes. No fever, chills. No neck pain or injuries. No cp or sob.   Past Medical History  Diagnosis Date  . Hypertension   . Diabetes mellitus without complication   . Adrenal adenoma   . Hyperaldosteronism     Possibly  . Hypokalemia   . Hyperlipidemia   . Allergy   . Anemia   . Headache     MIGRAINES  . GERD (gastroesophageal reflux disease)   . Ovarian cyst   . Endometriosis of rectovaginal septum    Past Surgical History  Procedure Laterality Date  . Cesarean section      X 2  . Cardiac catheterization    . Tubal ligation    . Robotic assisted total hysterectomy with bilateral salpingo oopherectomy Bilateral 06/16/2014    Procedure: ROBOTIC ASSISTED TOTAL HYSTERECTOMY WITH BILATERAL SALPINGO OOPHORECTOMY, LYSIS OF ADHESIONS;  Surgeon: Everitt Amber, MD;  Location: [facility];  Service: Gynecology;  Laterality: Bilateral;   Family History  Problem Relation Age of Onset  . Diabetes Father   . Hepatitis C Father    . Stroke Mother   . Diabetes Mother   . Stroke Brother    History  Substance Use Topics  . Smoking status: Never Smoker   . Smokeless tobacco: Not on file  . Alcohol Use: No   OB History    No data available     Review of Systems  Constitutional: Negative for fever and chills.  Eyes: Negative for photophobia, pain and visual disturbance.  Respiratory: Negative for cough, chest tightness and shortness of breath.   Cardiovascular: Negative for chest pain, palpitations and leg swelling.  Gastrointestinal: Negative for nausea, vomiting, abdominal pain and diarrhea.  Genitourinary: Negative for dysuria, flank pain, vaginal bleeding, vaginal discharge, vaginal pain and pelvic pain.  Musculoskeletal: Negative for myalgias, arthralgias, neck pain and neck stiffness.  Skin: Negative for rash.  Neurological: Positive for dizziness, light-headedness and numbness. Negative for weakness and headaches.  All other systems reviewed and are negative.     Allergies  Ace inhibitors and Hctz  Home Medications   Prior to Admission medications   Medication Sig Start Date End Date Taking? Authorizing Provider  aspirin 81 MG tablet Take 81 mg by mouth daily.   Yes Historical Provider, MD  carvedilol (COREG) 6.25 MG tablet Take 1 tablet (6.25 mg total) by mouth 2 (two) times daily with a meal. Patient taking differently: Take 6.25 mg by mouth at bedtime.  08/25/13  Yes Vicie Mutters, PA-C  Cholecalciferol (VITAMIN D PO) Take 50,000 Units by mouth once a week.    Yes Historical Provider, MD  Cyanocobalamin (B-12 PO) Take 1 tablet by mouth daily.   Yes Historical Provider, MD  docusate sodium (COLACE) 100 MG capsule Take 1 capsule (100 mg total) by mouth 2 (two) times daily. 06/18/14  Yes Everitt Amber, MD  Iron TABS Take 1 tablet by mouth daily.   Yes Historical Provider, MD  losartan (COZAAR) 100 MG tablet Take 50 mg by mouth every other day.    Yes Historical Provider, MD  oxyCODONE-acetaminophen  (PERCOCET/ROXICET) 5-325 MG per tablet Take 1-2 tablets by mouth every 4 (four) hours as needed for severe pain (moderate to severe pain). 06/17/14  Yes Melissa D Cross, NP  pantoprazole (PROTONIX) 40 MG tablet TAKE 1 TABLET (40 MG TOTAL) BY MOUTH DAILY. 07/12/14  Yes Vicie Mutters, PA-C  polyethylene glycol powder (MIRALAX) powder Take 255 g by mouth daily. 06/18/14  Yes Everitt Amber, MD  spironolactone (ALDACTONE) 100 MG tablet Take 100 mg by mouth at bedtime.   Yes Historical Provider, MD  oxyCODONE-acetaminophen (PERCOCET) 5-325 MG per tablet Take 1-2 tablets by mouth every 4 (four) hours as needed for severe pain. Patient not taking: Reported on 08/01/2014 06/18/14   Everitt Amber, MD   BP 171/94 mmHg  Pulse 88  Resp 18  SpO2 99%  LMP 05/16/2014 Physical Exam  Constitutional: She is oriented to person, place, and time. She appears well-developed and well-nourished. No distress.  HENT:  Head: Normocephalic.  Right Ear: External ear normal.  Left Ear: External ear normal.  Nose: Nose normal.  Mouth/Throat: Oropharynx is clear and moist.  Eyes: Conjunctivae and EOM are normal. Pupils are equal, round, and reactive to light.  Neck: Neck supple.  Cardiovascular: Normal rate, regular rhythm and normal heart sounds.   Pulmonary/Chest: Effort normal and breath sounds normal. No respiratory distress. She has no wheezes. She has no rales.  Abdominal: Soft. Bowel sounds are normal. She exhibits no distension. There is no tenderness. There is no rebound.  Musculoskeletal: She exhibits no edema.  Neurological: She is alert and oriented to person, place, and time.  5/5 and equal upper and lower extremity strength bilaterally. Equal grip strength bilaterally. Normal finger to nose and heel to shin. No pronator drift.   Skin: Skin is warm and dry.  Psychiatric: She has a normal mood and affect. Her behavior is normal.  Nursing note and vitals reviewed.   ED Course  Procedures (including critical care  time) Labs Review Labs Reviewed  BASIC METABOLIC PANEL - Abnormal; Notable for the following:    Glucose, Bld 267 (*)    All other components within normal limits  URINALYSIS, ROUTINE W REFLEX MICROSCOPIC - Abnormal; Notable for the following:    Glucose, UA 500 (*)    Hgb urine dipstick SMALL (*)    Leukocytes, UA TRACE (*)    All other components within normal limits  CBG MONITORING, ED - Abnormal; Notable for the following:    Glucose-Capillary 272 (*)    All other components within normal limits  CBC  URINE MICROSCOPIC-ADD ON  CBG MONITORING, ED  I-STAT TROPOININ, ED    Imaging Review Ct Head Wo Contrast  08/01/2014   CLINICAL DATA:  Dizziness.  EXAM: CT HEAD WITHOUT CONTRAST  TECHNIQUE: Contiguous axial images were obtained from the base of the skull through the vertex without intravenous contrast.  COMPARISON:  CT scan of August 28, 2011.  MRI of August 23, 2012.  FINDINGS: Bony calvarium appears intact. No mass effect or midline shift is noted. Ventricular size is within normal limits. There is no evidence of mass lesion, hemorrhage or acute infarction. Stable scattered low densities are noted in the periventricular white matter most consistent with chronic ischemic white matter disease.  IMPRESSION: Stable scattered low densities are seen throughout periventricular white matter most consistent with chronic ischemic white matter disease. No acute intracranial abnormality seen.   Electronically Signed   By: Marijo Conception, M.D.   On: 08/01/2014 11:35   Mr Jeri Cos FT Contrast  08/01/2014   CLINICAL DATA:  Ongoing dizziness and left arm tingling.  EXAM: MRI HEAD WITHOUT AND WITH CONTRAST  TECHNIQUE: Multiplanar, multiecho pulse sequences of the brain and surrounding structures were obtained without and with intravenous contrast.  CONTRAST:  42mL MULTIHANCE GADOBENATE DIMEGLUMINE 529 MG/ML IV SOLN  COMPARISON:  CT of the head without contrast 08/01/2014. MRI of the brain 08/23/2012   FINDINGS: Extensive subcortical T2 and FLAIR hyperintensities are similar to prior study. There is no associated restricted diffusion or enhancement.  No acute infarct, hemorrhage, or mass lesion is present. The ventricles are of normal size. No significant extraaxial fluid collection is present.  The postcontrast images demonstrate no pathologic enhancement.  Flow is present in the major intracranial arteries. The globes and orbits are intact. The paranasal sinuses mastoid air cells are clear.  Midline structures are within normal limits. The skullbase is unremarkable. The sella is within normal limits. The optic chiasm is unremarkable.  IMPRESSION: 1. Stable extensive subcortical T2 hyperintensities bilaterally. These are nonspecific, could but compatible with the suggested diagnosis of multiple sclerosis. 2. No acute intracranial abnormality or significant interval change.   Electronically Signed   By: San Morelle M.D.   On: 08/01/2014 13:48     EKG Interpretation   Date/Time:  Saturday August 01 2014 08:59:19 EST Ventricular Rate:  121 PR Interval:  183 QRS Duration: 84 QT Interval:  374 QTC Calculation: 531 R Axis:   54 Text Interpretation:  Sinus tachycardia Nonspecific T abnormalities,  diffuse leads Prolonged QT interval Confirmed by ZAMMIT  MD, JOSEPH  ([id number]) on 08/01/2014 9:02:58 AM      MDM   Final diagnoses:  Vertigo    patient with vertigo-like symptoms, also has not been taking her diabetes medications for a month. Also complaining of left arm "tingling." Exam unremarkable. Neurovascularly intact. VS normal.   CT labs unremarkable. Discussed with Dr. Winfred Leeds, differential includes vertigo vs CVA. Will get MRI to rule out CVA   MR negative other than some stable white matter changes.  Pt asymptomatic. symtoms most consistent with vertigo, however, given not completely normal MR, will follow up with [facility]. Pt has been seen by them in the past. Pt is  also instructed to call PCP and restart her dm medications. Meclizine prescription given  Filed Vitals:   08/01/14 1247 08/01/14 1415 08/01/14 1511 08/01/14 1520  BP: 130/88 131/80  136/92  Pulse: 74 71  85  Temp:   97.9 F (36.6 C)   TempSrc:   Oral   Resp: 15 17  18   SpO2: 99% 99%  100%     Renold Genta, PA-C 08/01/14 Rosita, MD 08/01/14 1708

## 2014-08-01 NOTE — Discharge Instructions (Signed)
Take meclizine for dizziness as needed. Follow up with [facility] for further evaluation.   Vertigo Vertigo means you feel like you or your surroundings are moving when they are not. Vertigo can be dangerous if it occurs when you are at work, driving, or performing difficult activities.  CAUSES  Vertigo occurs when there is a conflict of signals sent to your brain from the visual and sensory systems in your body. There are many different causes of vertigo, including:  Infections, especially in the inner ear.  A bad reaction to a drug or misuse of alcohol and medicines.  Withdrawal from drugs or alcohol.  Rapidly changing positions, such as lying down or rolling over in bed.  A migraine headache.  Decreased blood flow to the brain.  Increased pressure in the brain from a head injury, infection, tumor, or bleeding. SYMPTOMS  You may feel as though the world is spinning around or you are falling to the ground. Because your balance is upset, vertigo can cause nausea and vomiting. You may have involuntary eye movements (nystagmus). DIAGNOSIS  Vertigo is usually diagnosed by physical exam. If the cause of your vertigo is unknown, your caregiver may perform imaging tests, such as an MRI scan (magnetic resonance imaging). TREATMENT  Most cases of vertigo resolve on their own, without treatment. Depending on the cause, your caregiver may prescribe certain medicines. If your vertigo is related to body position issues, your caregiver may recommend movements or procedures to correct the problem. In rare cases, if your vertigo is caused by certain inner ear problems, you may need surgery. HOME CARE INSTRUCTIONS   Follow your caregiver's instructions.  Avoid driving.  Avoid operating heavy machinery.  Avoid performing any tasks that would be dangerous to you or others during a vertigo episode.  Tell your caregiver if you notice that certain medicines seem to be causing your vertigo.  Some of the medicines used to treat vertigo episodes can actually make them worse in some people. SEEK IMMEDIATE MEDICAL CARE IF:   Your medicines do not relieve your vertigo or are making it worse.  You develop problems with talking, walking, weakness, or using your arms, hands, or legs.  You develop severe headaches.  Your nausea or vomiting continues or gets worse.  You develop visual changes.  A family member notices behavioral changes.  Your condition gets worse. MAKE SURE YOU:  Understand these instructions.  Will watch your condition.  Will get help right away if you are not doing well or get worse. Document Released: 03/01/2005 Document Revised: 08/14/2011 Document Reviewed: 12/08/2010 [facility] Patient Information 2015 [place], [place]. This information is not intended to replace advice given to you by your health care provider. Make sure you discuss any questions you have with your health care provider.

## 2014-08-03 ENCOUNTER — Encounter: Payer: Self-pay | Admitting: Gynecologic Oncology

## 2014-08-03 ENCOUNTER — Ambulatory Visit: Payer: Managed Care, Other (non HMO) | Attending: Gynecologic Oncology | Admitting: Gynecologic Oncology

## 2014-08-03 VITALS — BP 121/85 | HR 77 | Temp 97.7°F | Resp 18 | Ht 67.0 in | Wt 196.2 lb

## 2014-08-03 DIAGNOSIS — N809 Endometriosis, unspecified: Secondary | ICD-10-CM | POA: Insufficient documentation

## 2014-08-03 DIAGNOSIS — Z90722 Acquired absence of ovaries, bilateral: Secondary | ICD-10-CM | POA: Diagnosis not present

## 2014-08-03 DIAGNOSIS — N804 Endometriosis of rectovaginal septum, unspecified involvement of vagina: Secondary | ICD-10-CM

## 2014-08-03 DIAGNOSIS — N83299 Other ovarian cyst, unspecified side: Secondary | ICD-10-CM

## 2014-08-03 DIAGNOSIS — Z90710 Acquired absence of both cervix and uterus: Secondary | ICD-10-CM | POA: Insufficient documentation

## 2014-08-03 DIAGNOSIS — Z48816 Encounter for surgical aftercare following surgery on the genitourinary system: Secondary | ICD-10-CM | POA: Insufficient documentation

## 2014-08-03 NOTE — Progress Notes (Signed)
POSTOPERATIVE FOLLOWUP   HPI:  Kristin Hess is a 49 y.o. year old woman initially seen in consultation on 05/27/14 for a pelvic mass and elevated CA 125.  She then underwent a robotic hysterectomy, BSO, and lysis of adhesions on 1/70/01 without complications.  Intraoperatively she was noted to have extensive stage IV endometriosis and adhesions. Her postoperative course was uncomplicated.  Her final pathology revealed endometriosis.  She is seen today for a postoperative check of her vaginal cuff. She initially has vaginal spotting postop, then developed bright red bleeding but this has now resolved.  She had declined postop HRT.   Review of systems: Constitutional:  She has no weight gain or weight loss. She has no fever or chills. Eyes: No blurred vision Ears, Nose, Mouth, Throat: No dizziness, headaches or changes in hearing. No mouth sores. Cardiovascular: No chest pain, palpitations or edema. Respiratory:  No shortness of breath, wheezing or cough Gastrointestinal: She has normal bowel movements without diarrhea or constipation. She denies any nausea or vomiting. She denies blood in her stool or heart burn. Genitourinary:  She denies pelvic pain, pelvic pressure or changes in her urinary function. She has no hematuria, dysuria, or incontinence. + vaginal bleeding, no vaginal discharge Musculoskeletal: Denies muscle weakness or joint pains.  Skin:  She has no skin changes, rashes or itching Neurological:  Denies dizziness or headaches. No neuropathy, no numbness or tingling. Psychiatric:  She denies depression or anxiety. Hematologic/Lymphatic:   No easy bruising or bleeding   Physical Exam: Blood pressure 121/85, pulse 77, temperature 97.7 F (36.5 C), temperature source Oral, resp. rate 18, height 5\' 7"  (1.702 m), weight 196 lb 3.2 oz (88.996 kg), last menstrual period 05/16/2014. General: Well dressed, well nourished in no apparent distress.   HEENT:  Normocephalic and  atraumatic, no lesions.  Extraocular muscles intact. Sclerae anicteric. Pupils equal, round, reactive. No mouth sores or ulcers. Thyroid is normal size, not nodular, midline. Skin:  No lesions or rashes. Breasts: deferred Lungs:  deferred Cardiovascular:  deferred Abdomen:  Soft, nontender, nondistended.  No palpable masses.  No hepatosplenomegaly.  No ascites. Normal bowel sounds.  No hernias.  Incisions are healing well without signs of infection Genitourinary: Normal EGBUS  Vaginal cuff intact. Blood tinged discharge seen in vaginal vault. Cuff is in tact visibly and palpably. Extremities: No cyanosis, clubbing or edema.  No calf tenderness or erythema. No palpable cords. Psychiatric: Mood and affect are appropriate. Neurological: Awake, alert and oriented x 3. Sensation is intact, no neuropathy.  Musculoskeletal: No pain, normal strength and range of motion.  Assessment:    49 y.o. year old with  S/p robotic hysterectomy, BSO on 06/16/14.   Plan: 1) Vaginal cuff bleeding - appears to be healing more normally. Recommend avoiding intercourse for 4 more weeks.  2  No planned followup necessary  Donaciano Eva, MD

## 2014-08-03 NOTE — Patient Instructions (Signed)
Please call for any questions or concerns. 

## 2014-08-26 ENCOUNTER — Encounter: Payer: Self-pay | Admitting: Physician Assistant

## 2014-09-12 ENCOUNTER — Other Ambulatory Visit: Payer: Self-pay | Admitting: Internal Medicine

## 2014-09-12 ENCOUNTER — Other Ambulatory Visit: Payer: Self-pay | Admitting: Physician Assistant

## 2014-09-13 ENCOUNTER — Other Ambulatory Visit: Payer: Self-pay | Admitting: Internal Medicine

## 2014-10-05 ENCOUNTER — Other Ambulatory Visit: Payer: Self-pay | Admitting: *Deleted

## 2014-10-06 ENCOUNTER — Other Ambulatory Visit: Payer: Self-pay

## 2014-10-06 MED ORDER — PANTOPRAZOLE SODIUM 40 MG PO TBEC: DELAYED_RELEASE_TABLET | ORAL | Status: DC

## 2014-11-30 ENCOUNTER — Other Ambulatory Visit: Payer: Self-pay

## 2014-12-16 ENCOUNTER — Other Ambulatory Visit: Payer: Self-pay | Admitting: Internal Medicine

## 2015-08-31 ENCOUNTER — Encounter: Payer: Self-pay | Admitting: Physician Assistant

## 2015-09-08 ENCOUNTER — Encounter: Payer: Self-pay | Admitting: Physician Assistant

## 2015-09-08 ENCOUNTER — Ambulatory Visit (INDEPENDENT_AMBULATORY_CARE_PROVIDER_SITE_OTHER): Payer: Managed Care, Other (non HMO) | Admitting: Physician Assistant

## 2015-09-08 VITALS — BP 152/120 | HR 78 | Temp 97.5°F | Resp 16 | Ht 67.0 in | Wt 192.4 lb

## 2015-09-08 DIAGNOSIS — E876 Hypokalemia: Secondary | ICD-10-CM

## 2015-09-08 DIAGNOSIS — G459 Transient cerebral ischemic attack, unspecified: Secondary | ICD-10-CM | POA: Diagnosis not present

## 2015-09-08 DIAGNOSIS — E119 Type 2 diabetes mellitus without complications: Secondary | ICD-10-CM | POA: Diagnosis not present

## 2015-09-08 DIAGNOSIS — E785 Hyperlipidemia, unspecified: Secondary | ICD-10-CM

## 2015-09-08 DIAGNOSIS — I10 Essential (primary) hypertension: Secondary | ICD-10-CM | POA: Diagnosis not present

## 2015-09-08 DIAGNOSIS — E559 Vitamin D deficiency, unspecified: Secondary | ICD-10-CM

## 2015-09-08 DIAGNOSIS — E269 Hyperaldosteronism, unspecified: Secondary | ICD-10-CM | POA: Insufficient documentation

## 2015-09-08 DIAGNOSIS — I679 Cerebrovascular disease, unspecified: Secondary | ICD-10-CM | POA: Insufficient documentation

## 2015-09-08 LAB — CBC WITH DIFFERENTIAL/PLATELET
Basophils Absolute: 0 cells/uL (ref 0–200)
Basophils Relative: 0 %
EOS PCT: 1 %
Eosinophils Absolute: 66 cells/uL (ref 15–500)
HCT: 42.4 % (ref 35.0–45.0)
HEMOGLOBIN: 13.9 g/dL (ref 11.7–15.5)
LYMPHS ABS: 2046 {cells}/uL (ref 850–3900)
Lymphocytes Relative: 31 %
MCH: 26.8 pg — ABNORMAL LOW (ref 27.0–33.0)
MCHC: 32.8 g/dL (ref 32.0–36.0)
MCV: 81.7 fL (ref 80.0–100.0)
MPV: 10.8 fL (ref 7.5–12.5)
Monocytes Absolute: 396 cells/uL (ref 200–950)
Monocytes Relative: 6 %
NEUTROS ABS: 4092 {cells}/uL (ref 1500–7800)
Neutrophils Relative %: 62 %
PLATELETS: 226 10*3/uL (ref 140–400)
RBC: 5.19 MIL/uL — AB (ref 3.80–5.10)
RDW: 13.3 % (ref 11.0–15.0)
WBC: 6.6 10*3/uL (ref 3.8–10.8)

## 2015-09-08 MED ORDER — METFORMIN HCL ER 500 MG PO TB24: ORAL_TABLET | ORAL | Status: DC

## 2015-09-08 MED ORDER — LOSARTAN POTASSIUM 100 MG PO TABS: 100.0000 mg | ORAL_TABLET | Freq: Every morning | ORAL | Status: DC

## 2015-09-08 MED ORDER — ATENOLOL 50 MG PO TABS: 50.0000 mg | ORAL_TABLET | Freq: Every day | ORAL | Status: DC

## 2015-09-08 MED ORDER — SITAGLIPTIN PHOSPHATE 100 MG PO TABS: 100.0000 mg | ORAL_TABLET | Freq: Every day | ORAL | Status: DC

## 2015-09-08 NOTE — Progress Notes (Signed)
Assessment and Plan:  1. Hypertension -switch metoprolol to atenolol, increase cozaar to 122m daily,  monitor blood pressure at home. -message or call with BP in 3-4 days  -Continue DASH diet.   - Reminder to go to the ER if any CP, SOB, nausea, dizziness, severe HA, changes vision/speech, left arm numbness and tingling and jaw pain.  2. Cholesterol -Continue diet and exercise. Check cholesterol.   3. Diabetes without complications -Continue diet and exercise.  - increase MF to 4 a day, add januvia, continue to check sugars at home - discussed seeing endocrinology for DM and possible hyperaldosteronism, declines at this time.   4. Vitamin D Def - check level and continue medications.   5. TIA -Continue aspirin/plavix - increase BP meds - will refer to neuro, has seen Dr. WJannifer Franklinin the past  6. Rash ? Drug reaction, states has been on all other medications other than metoprolol, will switch and if continues can be from plavix.   Continue diet and meds as discussed. Further disposition pending results of labs. Discussed med's effects and SE's.    Over 30 minutes of exam, counseling, chart review, and critical decision making was performed   HPI 50y.o. female  presents for hospital follow up and over due for followup, has not been seen in 2 years.   [facility] had TIA due to hypertension/sugar, sugar was at 400. Had normal CTA head and neck. She had a low potassium at 3.4 and her A1C was 12.  They started her on potassium 20 for 5 days, metoprolol 276mBID, amlodipine 5 mg, losartan 2584mmetformin 500m55mD and 81mg47m and plavix.  States she has a rash from possible metoprolol, has been on other things on the list.  She continue to have HA off and on, denies numbness, weakness. Has had some changes in vision. She does have a history of possible hyperaldosteronism.   Her blood pressure has been uncontrolled at home, today her BP is BP: (!)  152/120 mmHg. She has been very stressed, her and her husband have been separated this past year.   She does not workout. She denies chest pain, shortness of breath, dizziness.  She is not on cholesterol medication and denies myalgias.. The cholesterol was:   Lab Results  Component Value Date   CHOL 195 05/18/2014   HDL 63 05/18/2014   LDLCALC 114* 05/18/2014   TRIG 92 05/18/2014   CHOLHDL 3.1 05/18/2014    She has been working on diet and exercise for diabetes without complications, she is on bASA, she is on ACE/ARB, and denies  hypoglycemia , paresthesia of the feet, polydipsia and polyuria. She has had some changes in her vision. She is checking her sugars, it has been running 200's  [facility]  [facility] Date   HGBA1C 6.8* 10/30/2013    Patient is on Vitamin D supplement. Lab Results  Component Value Date   VD25OH 57 07/30/2013     Current Medications:    Medication List       This list is accurate as of: 09/08/15  4:16 PM.  Always use your most recent med list.               amLODipine 5 MG tablet  Commonly known as:  NORVASC  Take 5 mg by mouth every morning.     aspirin 81 MG EC tablet  Take 81  mg by mouth every morning.     B-12 PO  Take 1 tablet by mouth daily. Reported on 09/08/2015     clopidogrel 75 MG tablet  Commonly known as:  PLAVIX  Take 75 mg by mouth every morning.     Iron Tabs  Take 1 tablet by mouth daily. Reported on 09/08/2015     losartan 25 MG tablet  Commonly known as:  COZAAR  Take 25 mg by mouth every morning.     ONE TOUCH ULTRA MINI w/Device Kit  See admin instructions. for testing     ONE TOUCH ULTRA TEST test strip  Generic drug:  glucose blood  2 (two) times daily. for testing     pantoprazole 40 MG tablet  Commonly known as:  PROTONIX  TAKE 1 TABLET (40 MG TOTAL) BY MOUTH DAILY.     VITAMIN D PO  Take 50,000 Units by mouth once a week. Reported on 09/08/2015         Medical History:  Past Medical History  Diagnosis Date  . Hypertension   . Diabetes mellitus without complication ([place])   . Adrenal adenoma   . Hyperaldosteronism ([place])     Possibly  . Hypokalemia   . Hyperlipidemia   . Allergy   . Anemia   . Headache     MIGRAINES  . GERD (gastroesophageal reflux disease)   . Ovarian cyst   . Endometriosis of rectovaginal septum    Allergies:  Allergies  Allergen Reactions  . Ace Inhibitors Cough  . Hctz [Hydrochlorothiazide] Cough     Review of Systems:  Review of Systems  Constitutional: Positive for malaise/fatigue. Negative for fever, chills, weight loss and diaphoresis.  HENT: Negative for congestion, ear discharge, ear pain, hearing loss, nosebleeds, sore throat and tinnitus.   Eyes: Negative.  Negative for blurred vision.  Respiratory: Negative.  Negative for stridor.   Cardiovascular: Positive for chest pain (nonexertional, worse with food). Negative for palpitations, orthopnea, claudication, leg swelling and PND.  Gastrointestinal: Positive for heartburn. Negative for nausea, vomiting, abdominal pain, diarrhea, constipation, blood in stool and melena.  Genitourinary: Negative.   Musculoskeletal: Negative.   Skin: Positive for rash (diffuse along arms and chest). Negative for itching.  Neurological: Positive for headaches. Negative for dizziness, tingling, tremors, sensory change, speech change, focal weakness, seizures, loss of consciousness and weakness.  Psychiatric/Behavioral: Negative for depression, suicidal ideas, hallucinations and substance abuse. The patient is nervous/anxious. The patient does not have insomnia.     Family history- Review and unchanged Social history- Review and unchanged Physical Exam: BP 152/120 mmHg  Pulse 78  Temp(Src) 97.5 F (36.4 C) (Temporal)  Resp 16  Ht _0  (1.702 m)  Wt 192 lb 6.4 oz (87.272 kg)  BMI 30.13 kg/m2  SpO2 98%  LMP 05/16/2014 Wt Readings from Last 3 Encounters:   09/08/15 192 lb 6.4 oz (87.272 kg)  08/03/14 196 lb 3.2 oz (88.996 kg)  07/06/14 198 lb 11.2 oz (90.13 kg)   General Appearance: Well nourished, in no apparent distress. Eyes: PERRLA, EOMs, conjunctiva no swelling or erythema Sinuses: No Frontal/maxillary tenderness ENT/Mouth: Ext aud canals clear, TMs without erythema, bulging. No erythema, swelling, or exudate on post pharynx.  Tonsils not swollen or erythematous. Hearing normal.  Neck: Supple, thyroid normal.  Respiratory: Respiratory effort normal, BS equal bilaterally without rales, rhonchi, wheezing or stridor.  Cardio: RRR with no MRGs. Brisk peripheral pulses without edema.  Abdomen: Soft, + BS.  Non tender, no guarding, rebound,  hernias, masses. Lymphatics: Non tender without lymphadenopathy.  Musculoskeletal: Full ROM, 5/5 strength, Normal gait Skin: diffuse papular rash over arms and chest without erythema. Warm, dry without rashes, lesions, ecchymosis.  Neuro: Cranial nerves intact. No cerebellar symptoms.  Psych: Awake and oriented X 3, normal affect, Insight and Judgment appropriate.    Vicie Mutters, PA-C 3:54 PM [facility]

## 2015-09-08 NOTE — Patient Instructions (Addendum)
Stop the metoprolol, start on atenolol at night Increase the cozaar to 2 a day for 2-3 days Then go to 100mg  of the cozaar daily  Increase metformin Add on Januvia  We are starting you on Metformin to prevent or treat diabetes. Metformin does not cause low blood sugars. In order to create energy your cells need insulin and sugar but sometime your cells do not accept the insulin and this can cause increased sugars and decreased energy. The Metformin helps your cells accept insulin and the sugar to give you more energy.   The two most common side effects are nausea and diarrhea, follow these rules to avoid it! You can take imodium per box instructions when starting metformin if needed.   Rules of metformin: 1) start out slow with only one pill daily. Our goal for you is 4 pills a day or 2000mg  total.  2) take with your largest meal. 3) Take with least amount of carbs.   Call if you have any problems.   Diabetes is a very complicated disease...lets simplify it.  An easy way to look at it to understand the complications is if you think of the extra sugar floating in your blood stream as glass shards floating through your blood stream.    Diabetes affects your small vessels first: 1) The glass shards (sugar) scraps down the tiny blood vessels in your eyes and lead to diabetic retinopathy, the leading cause of blindness in the [place]. Diabetes is the leading cause of newly diagnosed adult (34 to 51 years of age) blindness in the [place].  2) The glass shards scratches down the tiny vessels of your legs leading to nerve damage called neuropathy and can lead to amputations of your feet. More than 60% of all non-traumatic amputations of lower limbs occur in people with diabetes.  3) Over time the small vessels in your brain are shredded and closed off, individually this does not cause any problems but over a long period of time many of the small vessels being blocked can lead to Vascular  Dementia.   4) Your kidney's are a filter system and have a "net" that keeps certain things in the body and lets bad things out. Sugar shreds this net and leads to kidney damage and eventually failure. Decreasing the sugar that is destroying the net and certain blood pressure medications can help stop or decrease progression of kidney disease. Diabetes was the primary cause of kidney failure in 44 percent of all new cases in 2011.  5) Diabetes also destroys the small vessels in your penis that lead to erectile dysfunction. Eventually the vessels are so damaged that you may not be responsive to cialis or viagra.   Diabetes and your large vessels: Your larger vessels consist of your coronary arteries in your heart and the carotid vessels to your brain. Diabetes or even increased sugars put you at 300% increased risk of heart attack and stroke and this is why.. The sugar scrapes down your large blood vessels and your body sees this as an internal injury and tries to repair itself. Just like you get a scab on your skin, your platelets will stick to the blood vessel wall trying to heal it. This is why we have diabetics on low dose aspirin daily, this prevents the platelets from sticking and can prevent plaque formation. In addition, your body takes cholesterol and tries to shove it into the open wound. This is why we want your LDL, or bad cholesterol,  below 70.   The combination of platelets and cholesterol over 5-10 years forms plaque that can break off and cause a heart attack or stroke.   PLEASE REMEMBER:  Diabetes is preventable! Up to 84 percent of complications and morbidities among individuals with type 2 diabetes can be prevented, delayed, or effectively treated and minimized with regular visits to a health professional, appropriate monitoring and medication, and a healthy diet and lifestyle.     Bad carbs also include fruit juice, alcohol, and sweet tea. These are empty calories that do not  signal to your brain that you are full.   Please remember the good carbs are still carbs which convert into sugar. So please measure them out no more than 1/2-1 cup of rice, oatmeal, pasta, and beans  Veggies are however free foods! Pile them on.   Not all fruit is created equal. Please see the list below, the fruit at the bottom is higher in sugars than the fruit at the top. Please avoid all dried fruits.

## 2015-09-09 LAB — LIPID PANEL
CHOL/HDL RATIO: 2.6 ratio (ref <=5.0)
CHOLESTEROL: 182 mg/dL (ref 125–200)
HDL: 69 mg/dL (ref >=46)
LDL Cholesterol: 96 mg/dL (ref <130)
Triglycerides: 85 mg/dL (ref <150)
VLDL: 17 mg/dL (ref <30)

## 2015-09-09 LAB — BASIC METABOLIC PANEL WITH GFR
BUN: 13 mg/dL (ref 7–25)
CALCIUM: 10.1 mg/dL (ref 8.6–10.4)
CHLORIDE: 100 mmol/L (ref 98–110)
CO2: 20 mmol/L (ref 20–31)
Creat: 0.78 mg/dL (ref 0.50–1.05)
GFR, Est African American: >89 mL/min (ref >=60)
GFR, Est Non African American: 89 mL/min (ref >=60)
Glucose, Bld: 216 mg/dL — ABNORMAL HIGH (ref 65–99)
Potassium: 3.7 mmol/L (ref 3.5–5.3)
SODIUM: 136 mmol/L (ref 135–146)

## 2015-09-09 LAB — HEPATIC FUNCTION PANEL
ALT: 26 U/L (ref 6–29)
AST: 19 U/L (ref 10–35)
Albumin: 4.7 g/dL (ref 3.6–5.1)
Alkaline Phosphatase: 106 U/L (ref 33–130)
BILIRUBIN DIRECT: 0.1 mg/dL (ref <=0.2)
BILIRUBIN TOTAL: 0.5 mg/dL (ref 0.2–1.2)
Indirect Bilirubin: 0.4 mg/dL (ref 0.2–1.2)
Total Protein: 7.7 g/dL (ref 6.1–8.1)

## 2015-09-09 LAB — MAGNESIUM: MAGNESIUM: 1.6 mg/dL (ref 1.5–2.5)

## 2015-09-09 LAB — VITAMIN D 25 HYDROXY (VIT D DEFICIENCY, FRACTURES): Vit D, 25-Hydroxy: 35 ng/mL (ref 30–100)

## 2015-09-09 LAB — TSH: TSH: 1.65 m[IU]/L

## 2015-09-13 ENCOUNTER — Encounter: Payer: Self-pay | Admitting: Physician Assistant

## 2015-09-21 ENCOUNTER — Ambulatory Visit (INDEPENDENT_AMBULATORY_CARE_PROVIDER_SITE_OTHER): Payer: Managed Care, Other (non HMO) | Admitting: Neurology

## 2015-09-21 ENCOUNTER — Other Ambulatory Visit: Payer: Self-pay | Admitting: Internal Medicine

## 2015-09-21 ENCOUNTER — Encounter: Payer: Self-pay | Admitting: Physician Assistant

## 2015-09-21 ENCOUNTER — Encounter: Payer: Self-pay | Admitting: Neurology

## 2015-09-21 VITALS — BP 148/101 | HR 67 | Ht 67.0 in | Wt 195.0 lb

## 2015-09-21 DIAGNOSIS — G459 Transient cerebral ischemic attack, unspecified: Secondary | ICD-10-CM | POA: Diagnosis not present

## 2015-09-21 NOTE — Patient Instructions (Signed)
Stroke Prevention Some medical conditions and behaviors are associated with an increased chance of having a stroke. You may prevent a stroke by making healthy choices and managing medical conditions. HOW CAN I REDUCE MY RISK OF HAVING A STROKE?   Stay physically active. Get at least 30 minutes of activity on most or all days.  Do not smoke. It may also be helpful to avoid exposure to secondhand smoke.  Limit alcohol use. Moderate alcohol use is considered to be:  No more than 2 drinks per day for men.  No more than 1 drink per day for nonpregnant women.  Eat healthy foods. This involves:  Eating 5 or more servings of fruits and vegetables a day.  Making dietary changes that address high blood pressure (hypertension), high cholesterol, diabetes, or obesity.  Manage your cholesterol levels.  Making food choices that are high in fiber and low in saturated fat, trans fat, and cholesterol may control cholesterol levels.  Take any prescribed medicines to control cholesterol as directed by your health care provider.  Manage your diabetes.  Controlling your carbohydrate and sugar intake is recommended to manage diabetes.  Take any prescribed medicines to control diabetes as directed by your health care provider.  Control your hypertension.  Making food choices that are low in salt (sodium), saturated fat, trans fat, and cholesterol is recommended to manage hypertension.  Ask your health care provider if you need treatment to lower your blood pressure. Take any prescribed medicines to control hypertension as directed by your health care provider.  If you are 18-39 years of age, have your blood pressure checked every 3-5 years. If you are 40 years of age or older, have your blood pressure checked every year.  Maintain a healthy weight.  Reducing calorie intake and making food choices that are low in sodium, saturated fat, trans fat, and cholesterol are recommended to manage  weight.  Stop drug abuse.  Avoid taking birth control pills.  Talk to your health care provider about the risks of taking birth control pills if you are over 35 years old, smoke, get migraines, or have ever had a blood clot.  Get evaluated for sleep disorders (sleep apnea).  Talk to your health care provider about getting a sleep evaluation if you snore a lot or have excessive sleepiness.  Take medicines only as directed by your health care provider.  For some people, aspirin or blood thinners (anticoagulants) are helpful in reducing the risk of forming abnormal blood clots that can lead to stroke. If you have the irregular heart rhythm of atrial fibrillation, you should be on a blood thinner unless there is a good reason you cannot take them.  Understand all your medicine instructions.  Make sure that other conditions (such as anemia or atherosclerosis) are addressed. SEEK IMMEDIATE MEDICAL CARE IF:   You have sudden weakness or numbness of the face, arm, or leg, especially on one side of the body.  Your face or eyelid droops to one side.  You have sudden confusion.  You have trouble speaking (aphasia) or understanding.  You have sudden trouble seeing in one or both eyes.  You have sudden trouble walking.  You have dizziness.  You have a loss of balance or coordination.  You have a sudden, severe headache with no known cause.  You have new chest pain or an irregular heartbeat. Any of these symptoms may represent a serious problem that is an emergency. Do not wait to see if the symptoms will   go away. Get medical help at once. Call your local emergency services (911 in [place] Do not drive yourself to the hospital.   This information is not intended to replace advice given to you by your health care provider. Make sure you discuss any questions you have with your health care provider.   Document Released: 06/29/2004 Document Revised: 06/12/2014 Document Reviewed:  11/22/2012 Elsevier Interactive Patient Education 2016 [employer] Inc.  

## 2015-09-21 NOTE — Progress Notes (Signed)
Reason for visit: TIA  Referring physician: Dr. Marcene Corning is a 50 y.o. female  History of present illness:  Kristin Hess is a 50 year old right-handed black female with a history of diabetes and hypertension that has been poorly controlled. The patient was seen through this office four years ago with migraine headaches. The patient is having increased headaches over the last 2-3 months, and toward the end of March 2017, she had an event consistent with a TIA. The patient had left sided weakness without numbness, she had difficulty walking for a period of time. She went to the emergency room in [place], [place] and was admitted for an evaluation. MRI of the brain was done and did not show evidence of an acute stroke. She has chronic white matter changes that seems to be progressed slightly from a prior study done one year ago. The patient had a CT angiogram of the head and neck that was unremarkable, a 2-D echocardiogram was done, and the patient indicates that the study was unremarkable as well. Her hemoglobin A1c was around 12, her blood pressures have been out of control with a diastolic blood pressure 740. The patient has been running blood sugars around 400 when she was admitted to the hospital. She does not smoke cigarettes. She was on aspirin prior to being admitted, Plavix was added to the regimen. She has resolved her deficits completely. She no longer has headaches at this time. She is sent to this office for an evaluation.  Past Medical History  Diagnosis Date  . Hypertension   . Diabetes mellitus without complication ([place])   . Adrenal adenoma   . Hyperaldosteronism ([place])     Possibly  . Hypokalemia   . Hyperlipidemia   . Allergy   . Anemia   . Headache     MIGRAINES  . GERD (gastroesophageal reflux disease)   . Ovarian cyst   . Endometriosis of rectovaginal septum     Past Surgical History  Procedure Laterality Date  . Cesarean section      X 2    . Cardiac catheterization    . Tubal ligation    . Robotic assisted total hysterectomy with bilateral salpingo oopherectomy Bilateral 06/16/2014    Procedure: ROBOTIC ASSISTED TOTAL HYSTERECTOMY WITH BILATERAL SALPINGO OOPHORECTOMY, LYSIS OF ADHESIONS;  Surgeon: Everitt Amber, MD;  Location: [facility];  Service: Gynecology;  Laterality: Bilateral;    Family History  Problem Relation Age of Onset  . Diabetes Father   . Hepatitis C Father   . Stroke Mother   . Diabetes Mother   . Stroke Brother     Social history:  reports that she has never smoked. She does not have any smokeless tobacco history on file. She reports that she does not drink alcohol or use illicit drugs.  Medications:  Prior to Admission medications   Medication Sig Start Date End Date Taking? Authorizing Provider  amLODipine (NORVASC) 5 MG tablet Take 5 mg by mouth every morning. 08/31/15  Yes Historical Provider, MD  aspirin 81 MG EC tablet Take 81 mg by mouth every morning. 08/31/15  Yes Historical Provider, MD  atenolol (TENORMIN) 50 MG tablet Take 1 tablet (50 mg total) by mouth daily. 09/08/15 09/07/16 Yes Kristin Mutters, PA-C  Blood Glucose Monitoring Suppl (ONE TOUCH ULTRA MINI) w/Device KIT See admin instructions. for testing 09/01/15  Yes Historical Provider, MD  Cholecalciferol (VITAMIN D PO) Take 50,000 Units by mouth once a week. Reported on 09/08/2015  Yes Historical Provider, MD  clopidogrel (PLAVIX) 75 MG tablet Take 75 mg by mouth every morning. 08/31/15  Yes Historical Provider, MD  Cyanocobalamin (B-12 PO) Take 1 tablet by mouth daily. Reported on 09/08/2015   Yes Historical Provider, MD  Iron TABS Take 1 tablet by mouth daily. Reported on 09/08/2015   Yes Historical Provider, MD  losartan (COZAAR) 100 MG tablet Take 1 tablet (100 mg total) by mouth every morning. 09/08/15  Yes Kristin Mutters, PA-C  Magnesium 250 MG TABS Take by mouth.   Yes Historical Provider, MD  metFORMIN (GLUCOPHAGE XR) 500 MG 24 hr tablet Take 2  tablets twice daily with meals 09/08/15  Yes Kristin Mutters, PA-C  ONE TOUCH ULTRA TEST test strip 2 (two) times daily. for testing 09/01/15  Yes Historical Provider, MD  pantoprazole (PROTONIX) 40 MG tablet TAKE 1 TABLET (40 MG TOTAL) BY MOUTH DAILY. 10/06/14  Yes Kristin Mutters, PA-C  sitaGLIPtin (JANUVIA) 100 MG tablet Take 1 tablet (100 mg total) by mouth daily. 09/08/15  Yes Kristin Mutters, PA-C  Vitamin D, Ergocalciferol, (DRISDOL) 50000 units CAPS capsule Take 50,000 Units by mouth every 7 (seven) days.   Yes Historical Provider, MD      Allergies  Allergen Reactions  . Ace Inhibitors Cough  . Hctz [Hydrochlorothiazide] Cough    ROS:  Out of a complete 14 system review of symptoms, the patient complains only of the following symptoms, and all other reviewed systems are negative.  Ringing in the ears, dizziness Vision changes Snoring Feeling hot, increased thirst Achy muscles Headache  Blood pressure 148/101, pulse 67, height '5\' 7"'  (1.702 m), weight 195 lb (88.451 kg), last menstrual period 05/16/2014.  Physical Exam  General: The patient is alert and cooperative at the time of the examination.  Eyes: Pupils are equal, round, and reactive to light. Discs are flat bilaterally.  Neck: The neck is supple, no carotid bruits are noted.  Respiratory: The respiratory examination is clear.  Cardiovascular: The cardiovascular examination reveals a regular rate and rhythm, no obvious murmurs or rubs are noted.  Skin: Extremities are without significant edema.  Neurologic Exam  Mental status: The patient is alert and oriented x 3 at the time of the examination. The patient has apparent normal recent and remote memory, with an apparently normal attention span and concentration ability.  Cranial nerves: Facial symmetry is present. There is good sensation of the face to pinprick and soft touch bilaterally. The strength of the facial muscles and the muscles to head turning and shoulder  shrug are normal bilaterally. Speech is well enunciated, no aphasia or dysarthria is noted. Extraocular movements are full. Visual fields are full. The tongue is midline, and the patient has symmetric elevation of the soft palate. No obvious hearing deficits are noted.  Motor: The motor testing reveals 5 over 5 strength of all 4 extremities. Good symmetric motor tone is noted throughout.  Sensory: Sensory testing is intact to pinprick, soft touch, vibration sensation, and position sense on all 4 extremities. No evidence of extinction is noted.  Coordination: Cerebellar testing reveals good finger-nose-finger and heel-to-shin bilaterally.  Gait and station: Gait is normal. Tandem gait is normal. Romberg is negative. No drift is seen.  Reflexes: Deep tendon reflexes are symmetric and normal bilaterally. Toes are downgoing bilaterally.   MRI brain 08/01/14:  IMPRESSION: 1. Stable extensive subcortical T2 hyperintensities bilaterally. These are nonspecific, could but compatible with the suggested diagnosis of multiple sclerosis. 2. No acute intracranial abnormality or significant interval  change.  * MRI scan images were reviewed online. I agree with the written report.    Assessment/Plan:  1. Cerebrovascular disease  2. Hypertension  3. Diabetes  The patient recently has had poorly controlled hypertension and diabetes, this combination has led to a TIA event. The patient now has better controlled blood pressures, her headaches have improved. She is on a combination of aspirin and Plavix. I would recommend staying on this combination for about 90 days, then stopping the Plavix. The patient was advised to keep close follow-up with her primary care physician for diabetes and hypertension management. If she has any further events, she will contact our office. She will follow-up if needed.  Jill Alexanders MD 09/21/2015 7:00 PM  [facility] [address] [place], [place] [address]  Phone [phone] Fax [phone]

## 2015-09-23 ENCOUNTER — Ambulatory Visit: Payer: Self-pay | Admitting: Physician Assistant

## 2015-09-29 ENCOUNTER — Ambulatory Visit (INDEPENDENT_AMBULATORY_CARE_PROVIDER_SITE_OTHER): Payer: Managed Care, Other (non HMO) | Admitting: Internal Medicine

## 2015-09-29 ENCOUNTER — Encounter: Payer: Self-pay | Admitting: Internal Medicine

## 2015-09-29 ENCOUNTER — Ambulatory Visit: Payer: Self-pay | Admitting: Physician Assistant

## 2015-09-29 VITALS — BP 160/100 | HR 76 | Temp 97.8°F | Resp 16 | Ht 67.0 in | Wt 193.0 lb

## 2015-09-29 DIAGNOSIS — R300 Dysuria: Secondary | ICD-10-CM | POA: Diagnosis not present

## 2015-09-29 MED ORDER — AMLODIPINE BESYLATE 10 MG PO TABS: 10.0000 mg | ORAL_TABLET | Freq: Every day | ORAL | Status: DC

## 2015-09-29 NOTE — Patient Instructions (Signed)
I will call you in the morning based on your urinalysis results about whether we need to use diflucan for yeast or an antibiotic for infection.  Please start taking 10 mg of amlodipine.  (take 2 of the 5mg  tablets until you run out.  Take them at the same time.)   I sent in a 10 mg tablet to the [facility].  Please call the office in 1.5 weeks if BP has not improved.  We are looking for 130-140/60-85

## 2015-09-29 NOTE — Progress Notes (Signed)
   Subjective:    Patient ID: Kristin Hess, female    DOB: 05-29-1966, 50 y.o.   MRN: [id number]  Dysuria  This is a new problem. Associated symptoms include frequency, hematuria, hesitancy, nausea and urgency. Pertinent negatives include no chills, discharge, flank pain, sweats or vomiting. Treatments tried: cranberry tablets. The treatment provided mild relief. Her past medical history is significant for recurrent UTIs. There is no history of catheterization, kidney stones, a single kidney, urinary stasis or a urological procedure.  Patient notes increasing dysuria in the mornings x 3 weeks.  She reports that this started right after she started taking [place].  She is not on invokana any longer. She is getting a foul odor first thing in the morning as well.  She has had some frequency and also some urgency.  She also notes some mild bladder heaviness.  No blood in urine, but cloudy appearance.  No changes in sexual partners.  She does have some mild vaginal itching, but no discharge.  Blood pressure has also been running high at home still.  She is taking all of her medications. She is taking atenolol 50 mg BID, losartan 100 mg in AM and amlodipine in the morning.  Blood pressures for her at home have been running 140/100.      Review of Systems  Constitutional: Negative for chills.  Gastrointestinal: Positive for nausea. Negative for vomiting.  Genitourinary: Positive for dysuria, hesitancy, urgency, frequency and hematuria. Negative for flank pain.       Objective:   Physical Exam  Constitutional: She is oriented to person, place, and time. She appears well-developed and well-nourished.  HENT:  Head: Normocephalic.  Mouth/Throat: Oropharynx is clear and moist. No oropharyngeal exudate.  Eyes: Conjunctivae are normal. No scleral icterus.  Neck: Normal range of motion. Neck supple. No JVD present. No thyromegaly present.  Cardiovascular: Normal rate, regular rhythm, normal heart sounds  and intact distal pulses.   Pulmonary/Chest: Effort normal and breath sounds normal. No respiratory distress. She has no wheezes. She has no rales. She exhibits no tenderness.  Abdominal: Soft. Bowel sounds are normal. She exhibits no distension and no mass. There is no tenderness. There is no rigidity, no rebound, no guarding, no CVA tenderness, no tenderness at McBurney's point and negative Murphy's sign.  Musculoskeletal: Normal range of motion.  Lymphadenopathy:    She has no cervical adenopathy.  Neurological: She is alert and oriented to person, place, and time.  Skin: Skin is warm and dry.  Psychiatric: She has a normal mood and affect. Her behavior is normal. Judgment and thought content normal.  Nursing note and vitals reviewed.   Filed Vitals:   09/29/15 1449  BP: 160/100  Pulse: 76  Temp: 97.8 F (36.6 C)  Resp: 16         Assessment & Plan:    1. Dysuria -wait on UA for medications - Urinalysis, Routine w reflex microscopic (not at [facility]) - Urine culture  2.  HTN -cont atenolol -cont losartan -increase amlodipine to 10 mg -if no  Improvement in 1.5 weeks call office or send mychart message

## 2015-09-30 ENCOUNTER — Encounter: Payer: Self-pay | Admitting: Physician Assistant

## 2015-09-30 ENCOUNTER — Other Ambulatory Visit: Payer: Self-pay | Admitting: Internal Medicine

## 2015-09-30 LAB — URINALYSIS, ROUTINE W REFLEX MICROSCOPIC
Bilirubin Urine: NEGATIVE
Glucose, UA: NEGATIVE
Ketones, ur: NEGATIVE
NITRITE: NEGATIVE
PH: 6 (ref 5.0–8.0)
PROTEIN: NEGATIVE
SPECIFIC GRAVITY, URINE: 1.008 (ref 1.001–1.035)

## 2015-09-30 LAB — URINALYSIS, MICROSCOPIC ONLY
Casts: NONE SEEN [LPF]
Crystals: NONE SEEN [HPF]
YEAST: NONE SEEN [HPF]

## 2015-09-30 MED ORDER — CIPROFLOXACIN HCL 500 MG PO TABS: 500.0000 mg | ORAL_TABLET | Freq: Two times a day (BID) | ORAL | Status: AC

## 2015-10-01 LAB — URINE CULTURE

## 2015-10-05 ENCOUNTER — Encounter: Payer: Self-pay | Admitting: Physician Assistant

## 2015-10-05 ENCOUNTER — Ambulatory Visit (INDEPENDENT_AMBULATORY_CARE_PROVIDER_SITE_OTHER): Payer: Managed Care, Other (non HMO) | Admitting: Physician Assistant

## 2015-10-05 VITALS — BP 150/100 | HR 103 | Temp 97.3°F | Resp 16 | Ht 67.0 in | Wt 188.2 lb

## 2015-10-05 DIAGNOSIS — D649 Anemia, unspecified: Secondary | ICD-10-CM | POA: Diagnosis not present

## 2015-10-05 DIAGNOSIS — E119 Type 2 diabetes mellitus without complications: Secondary | ICD-10-CM

## 2015-10-05 DIAGNOSIS — I10 Essential (primary) hypertension: Secondary | ICD-10-CM

## 2015-10-05 DIAGNOSIS — E876 Hypokalemia: Secondary | ICD-10-CM | POA: Diagnosis not present

## 2015-10-05 DIAGNOSIS — E8809 Other disorders of plasma-protein metabolism, not elsewhere classified: Secondary | ICD-10-CM | POA: Diagnosis not present

## 2015-10-05 DIAGNOSIS — R779 Abnormality of plasma protein, unspecified: Secondary | ICD-10-CM

## 2015-10-05 MED ORDER — METOCLOPRAMIDE HCL 5 MG PO TABS: 5.0000 mg | ORAL_TABLET | Freq: Three times a day (TID) | ORAL | Status: DC

## 2015-10-05 MED ORDER — GLIPIZIDE 5 MG PO TABS: ORAL_TABLET | ORAL | Status: DC

## 2015-10-05 NOTE — Progress Notes (Signed)
Subjective:    Patient ID: Kristin Hess, female    DOB: 04-Jun-1966, 50 y.o.   MRN: [id number]  HPI 50 y.o. 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 She had bruising with bASA every day with the plavix, and started on it every other day, has been out x 3 days, has seen Dr. Jannifer Franklin, recommend continuing plavix x 90 days and then just ASA, will stop plavix at end of June.  She is on atenolol 65m BID, norvasc 13m and losartan 10031mstates that BP is still high, has seen the best results with spirolactone.  Fasting sugars have been 180-130 in the morning, she is on Januvia 100m110metformin 500mg84m occ TID.  She states she has bloating, AB fullness x 1 week, only able to get 1 meal a day, she has had some constipation but no blood/diarrhea/dark stools. She has lower back pain with taking atenolol, has had some fast heart rates but no dizziness, HA, has had some hot flashes during the day/night.   Blood pressure 150/100, pulse 103, temperature 97.3 F (36.3 C), temperature source Temporal, resp. rate 16, height '5\' 7"'  (1.702 m), weight 188 lb 3.2 oz (85.367 kg), last menstrual period 05/16/2014, SpO2 97 %.  Past Medical History  Diagnosis Date  . Hypertension   . Diabetes mellitus without complication (HCC) [place] Adrenal adenoma   . Hyperaldosteronism (HCC) [place] Possibly  . Hypokalemia   . Hyperlipidemia   . Allergy   . Anemia   . Headache     MIGRAINES  . GERD (gastroesophageal reflux disease)   . Ovarian cyst   . Endometriosis of rectovaginal septum     Current Outpatient Prescriptions on File Prior to Visit  Medication Sig Dispense Refill  . amLODipine (NORVASC) 10 MG tablet Take 1 tablet (10 mg total) by mouth daily. 30 tablet 11  . aspirin 81 MG EC tablet Take 81 mg by mouth every morning.  0  . atenolol (TENORMIN) 50 MG tablet Take 1 tablet (50 mg total) by mouth daily. 30 tablet 11  . Blood Glucose Monitoring Suppl (ONE TOUCH ULTRA  MINI) w/Device KIT See admin instructions. for testing  0  . clopidogrel (PLAVIX) 75 MG tablet Take 75 mg by mouth every morning.  0  . Cyanocobalamin (B-12 PO) Take 1 tablet by mouth daily. Reported on 09/08/2015    . Iron TABS Take 1 tablet by mouth daily. Reported on 09/08/2015    . losartan (COZAAR) 100 MG tablet Take 1 tablet (100 mg total) by mouth every morning. 30 tablet 3  . Magnesium 250 MG TABS Take by mouth.    . metFORMIN (GLUCOPHAGE XR) 500 MG 24 hr tablet Take 2 tablets twice daily with meals 120 tablet 3  . ONE TOUCH ULTRA TEST test strip 2 (two) times daily. for testing  0  . pantoprazole (PROTONIX) 40 MG tablet TAKE 1 TABLET (40 MG TOTAL) BY MOUTH DAILY. 90 tablet 0  . sitaGLIPtin (JANUVIA) 100 MG tablet Take 1 tablet (100 mg total) by mouth daily. 30 tablet 6  . Vitamin D, Ergocalciferol, (DRISDOL) 50000 units CAPS capsule Take 50,000 Units by mouth every 7 (seven) days. Reported on 09/29/2015     No current facility-administered medications on file prior to visit.    Review of Systems  Constitutional: Positive for appetite change and fatigue. Negative for fever, chills and diaphoresis.  HENT: Negative for congestion, ear discharge, ear  pain, hearing loss, nosebleeds, sore throat and tinnitus.   Eyes: Negative.   Respiratory: Negative.  Negative for stridor.   Cardiovascular: Negative for chest pain, palpitations and leg swelling.  Gastrointestinal: Positive for constipation. Negative for nausea, vomiting, abdominal pain, diarrhea and blood in stool.  Genitourinary: Negative.   Musculoskeletal: Negative.   Skin: Negative for rash.  Neurological: Negative for dizziness, tremors, seizures, weakness and headaches.  Psychiatric/Behavioral: Negative for suicidal ideas and hallucinations. The patient is nervous/anxious.        Objective:   Physical Exam  Constitutional: She is oriented to person, place, and time. She appears well-developed and well-nourished.  HENT:  Head:  Normocephalic and atraumatic.  Right Ear: External ear normal.  Left Ear: External ear normal.  Mouth/Throat: Oropharynx is clear and moist.  + TMJ  Eyes: Conjunctivae and EOM are normal. Pupils are equal, round, and reactive to light.  Neck: Normal range of motion. Neck supple. No thyromegaly present.  Fatty supracalvicular  Cardiovascular: Normal rate, regular rhythm and normal heart sounds.  Exam reveals no gallop and no friction rub.   No murmur heard. Pulmonary/Chest: Effort normal and breath sounds normal. No respiratory distress. She has no wheezes.  Abdominal: Soft. Bowel sounds are normal. She exhibits distension (epigastric). She exhibits no mass. There is tenderness. There is no rebound and no guarding.  Musculoskeletal: Normal range of motion.  Lymphadenopathy:    She has no cervical adenopathy.  Neurological: She is alert and oriented to person, place, and time. She displays normal reflexes. No cranial nerve deficit. Coordination normal.  Skin: Skin is warm and dry.  Psychiatric: She has a normal mood and affect.      Assessment & Plan:  1. Essential hypertension ? Aldosteronism- check BMP, add back spirolactone, continue other medications, may need increase in atenolol.  - CBC with Differential/Platelet - BASIC METABOLIC PANEL WITH GFR - Hepatic function panel - TSH - Ambulatory referral to Endocrinology  2. Diabetes mellitus without complication ([place]) Did not tolerate GLPs, declines injectable for now, does admit to poor eating habits that she is trying to change, add on glipizide short term until gets to endocrine ? Gastroparesis, continue protonix, control sugars, add on reglan, needs colonoscopy this year, declines a this time.  - Ambulatory referral to Endocrinology - glipiZIDE (GLUCOTROL) 5 MG tablet; 1/2 tablet daily WITH FOOD  Dispense: 30 tablet; Refill: 11 - metoCLOPramide (REGLAN) 5 MG tablet; Take 1 tablet (5 mg total) by mouth 3 (three) times daily before  meals.  Dispense: 60 tablet; Refill: 1  3. Hypokalemia - BASIC METABOLIC PANEL WITH GFR - Ambulatory referral to Endocrinology  4. Anemia, unspecified anemia type - Iron and TIBC - Ferritin

## 2015-10-05 NOTE — Patient Instructions (Addendum)
Spirolactone add it May increase atenolol to 100mg  BID if needed Glipizide 1/2-1 tablet with food,  Do not take if sugar is less than 150  Gastroparesis Gastroparesis, also called delayed gastric emptying, is a condition in which food takes longer than normal to empty from the stomach. The condition is usually long-lasting (chronic). CAUSES This condition may be caused by:  An endocrine disorder, such as hypothyroidism or diabetes. Diabetes is the most common cause of this condition.  A nervous system disease, such as Parkinson disease or multiple sclerosis.  Cancer, infection, or surgery of the stomach or vagus nerve.  A connective tissue disorder, such as scleroderma.  Certain medicines. In most cases, the cause is not known. RISK FACTORS This condition is more likely to develop in:  People with certain disorders, including endocrine disorders, eating disorders, amyloidosis, and scleroderma.  People with certain diseases, including Parkinson disease or multiple sclerosis.  People with cancer or infection of the stomach or vagus nerve.  People who have had surgery on the stomach or vagus nerve.  People who take certain medicines.  Women. SYMPTOMS Symptoms of this condition include:  An early feeling of fullness when eating.  Nausea.  Weight loss.  Vomiting.  Heartburn.  Abdominal bloating.  Inconsistent blood glucose levels.  Lack of appetite.  Acid from the stomach coming up into the esophagus (gastroesophageal reflux).  Spasms of the stomach. Symptoms may come and go. DIAGNOSIS This condition is diagnosed with tests, such as:  Tests that check how long it takes food to move through the stomach and intestines. These tests include:  Upper gastrointestinal (GI) series. In this test, X-rays of the intestines are taken after you drink a liquid. The liquid makes the intestines show up better on the X-rays.  Gastric emptying scintigraphy. In this test,  scans are taken after you eat food that contains a small amount of radioactive material.  Wireless capsule GI monitoring system. This test involves swallowing a capsule that records information about movement through the stomach.  Gastric manometry. This test measures electrical and muscular activity in the stomach. It is done with a thin tube that is passed down the throat and into the stomach.  Endoscopy. This test checks for abnormalities in the lining of the stomach. It is done with a long, thin tube that is passed down the throat and into the stomach.  An ultrasound. This test can help rule out gallbladder disease or pancreatitis as a cause of your symptoms. It uses sound waves to take pictures of the inside of your body. TREATMENT There is no cure for gastroparesis. This condition may be managed with:  Treatment of the underlying condition causing the gastroparesis.  Lifestyle changes, including exercise and dietary changes. Dietary changes can include:  Changes in what and when you eat.  Eating smaller meals more often.  Eating low-fat foods.  Eating low-fiber forms of high-fiber foods, such as cooked vegetables instead of raw vegetables.  Having liquid foods in place of solid foods. Liquid foods are easier to digest.  Medicines. These may be given to control nausea and vomiting and to stimulate stomach muscles.  Getting food through a feeding tube. This may be done in severe cases.  A gastric neurostimulator. This is a device that is inserted into the body with surgery. It helps improve stomach emptying and control nausea and vomiting. HOME CARE INSTRUCTIONS  Follow your health care provider's instructions about exercise and diet.  Take medicines only as directed by your health  care provider. SEEK MEDICAL CARE IF:  Your symptoms do not improve with treatment.  You have new symptoms. SEEK IMMEDIATE MEDICAL CARE IF:  You have severe abdominal pain that does not  improve with treatment.  You have nausea that does not go away.  You cannot keep fluids down.   This information is not intended to replace advice given to you by your health care provider. Make sure you discuss any questions you have with your health care provider.   Document Released: 05/22/2005 Document Revised: 10/06/2014 Document Reviewed: 05/18/2014 Elsevier Interactive Patient Education [employer].

## 2015-10-06 ENCOUNTER — Other Ambulatory Visit: Payer: Self-pay | Admitting: Physician Assistant

## 2015-10-06 LAB — CBC WITH DIFFERENTIAL/PLATELET
BASOS ABS: 0 {cells}/uL (ref 0–200)
Basophils Relative: 0 %
EOS PCT: 0 %
Eosinophils Absolute: 0 cells/uL — ABNORMAL LOW (ref 15–500)
HCT: 43.9 % (ref 35.0–45.0)
HEMOGLOBIN: 14.4 g/dL (ref 11.7–15.5)
LYMPHS ABS: 1587 {cells}/uL (ref 850–3900)
Lymphocytes Relative: 23 %
MCH: 27 pg (ref 27.0–33.0)
MCHC: 32.8 g/dL (ref 32.0–36.0)
MCV: 82.2 fL (ref 80.0–100.0)
MPV: 10.6 fL (ref 7.5–12.5)
Monocytes Absolute: 414 cells/uL (ref 200–950)
Monocytes Relative: 6 %
NEUTROS ABS: 4899 {cells}/uL (ref 1500–7800)
Neutrophils Relative %: 71 %
Platelets: 215 10*3/uL (ref 140–400)
RBC: 5.34 MIL/uL — ABNORMAL HIGH (ref 3.80–5.10)
RDW: 13.6 % (ref 11.0–15.0)
WBC: 6.9 10*3/uL (ref 3.8–10.8)

## 2015-10-06 LAB — IRON AND TIBC
%SAT: 27 % (ref 11–50)
IRON: 101 ug/dL (ref 45–160)
TIBC: 368 ug/dL (ref 250–450)
UIBC: 267 ug/dL (ref 125–400)

## 2015-10-06 LAB — HEPATIC FUNCTION PANEL
ALBUMIN: 4.9 g/dL (ref 3.6–5.1)
ALT: 15 U/L (ref 6–29)
AST: 13 U/L (ref 10–35)
Alkaline Phosphatase: 103 U/L (ref 33–130)
BILIRUBIN TOTAL: 0.8 mg/dL (ref 0.2–1.2)
Bilirubin, Direct: 0.2 mg/dL (ref <=0.2)
Indirect Bilirubin: 0.6 mg/dL (ref 0.2–1.2)
TOTAL PROTEIN: 8.2 g/dL — AB (ref 6.1–8.1)

## 2015-10-06 LAB — BASIC METABOLIC PANEL WITH GFR
BUN: 16 mg/dL (ref 7–25)
CALCIUM: 10.6 mg/dL — AB (ref 8.6–10.4)
CHLORIDE: 101 mmol/L (ref 98–110)
CO2: 22 mmol/L (ref 20–31)
CREATININE: 0.91 mg/dL (ref 0.50–1.05)
GFR, Est African American: 85 mL/min (ref >=60)
GFR, Est Non African American: 74 mL/min (ref >=60)
Glucose, Bld: 161 mg/dL — ABNORMAL HIGH (ref 65–99)
Potassium: 4.1 mmol/L (ref 3.5–5.3)
SODIUM: 136 mmol/L (ref 135–146)

## 2015-10-06 LAB — FERRITIN: Ferritin: 156 ng/mL (ref 10–232)

## 2015-10-06 LAB — TSH: TSH: 2.71 m[IU]/L

## 2015-10-06 NOTE — Addendum Note (Signed)
Addended by: Vicie Mutters R on: 10/06/2015 09:06 AM   Modules accepted: Orders

## 2015-10-07 ENCOUNTER — Other Ambulatory Visit: Payer: Managed Care, Other (non HMO)

## 2015-10-08 LAB — BASIC METABOLIC PANEL WITH GFR
BUN: 19 mg/dL (ref 7–25)
CALCIUM: 10.2 mg/dL (ref 8.6–10.4)
CO2: 25 mmol/L (ref 20–31)
Chloride: 103 mmol/L (ref 98–110)
Creat: 0.89 mg/dL (ref 0.50–1.05)
GFR, EST NON AFRICAN AMERICAN: 76 mL/min (ref >=60)
GFR, Est African American: 87 mL/min (ref >=60)
GLUCOSE: 144 mg/dL — AB (ref 65–99)
POTASSIUM: 4 mmol/L (ref 3.5–5.3)
Sodium: 138 mmol/L (ref 135–146)

## 2015-10-08 LAB — IGG, IGA, IGM
IGG (IMMUNOGLOBIN G), SERUM: 1470 mg/dL (ref 690–1700)
IGM, SERUM: 45 mg/dL — AB (ref 52–322)
IgA: 172 mg/dL (ref 69–380)

## 2015-10-11 LAB — PROTEIN ELECTROPHORESIS, SERUM, WITH REFLEX
ALPHA-1-GLOBULIN: 0.3 g/dL (ref 0.2–0.3)
Albumin ELP: 4.2 g/dL (ref 3.8–4.8)
Alpha-2-Globulin: 0.9 g/dL (ref 0.5–0.9)
BETA 2: 0.4 g/dL (ref 0.2–0.5)
BETA GLOBULIN: 0.4 g/dL (ref 0.4–0.6)
GAMMA GLOBULIN: 1.2 g/dL (ref 0.8–1.7)
TOTAL PROTEIN, SERUM ELECTROPHOR: 7.3 g/dL (ref 6.1–8.1)

## 2015-10-11 LAB — PROTEIN ELECTROPHORESIS,RANDOM URN
CREATININE, URINE: 217 mg/dL (ref 20–320)
MONOCLONAL BAND 1: DETECTED %
Protein Creatinine Ratio: 74 mg/g creat (ref 21–161)
TOTAL PROTEIN, URINE: 16 mg/dL (ref 5–24)

## 2015-10-15 ENCOUNTER — Other Ambulatory Visit: Payer: Self-pay | Admitting: Internal Medicine

## 2015-10-15 ENCOUNTER — Encounter: Payer: Self-pay | Admitting: Physician Assistant

## 2015-10-15 MED ORDER — ONETOUCH ULTRA BLUE VI STRP: 1.0000 | ORAL_STRIP | Freq: Two times a day (BID) | Status: DC

## 2015-10-17 ENCOUNTER — Encounter: Payer: Self-pay | Admitting: Physician Assistant

## 2015-10-18 ENCOUNTER — Encounter: Payer: Self-pay | Admitting: Physician Assistant

## 2015-10-24 ENCOUNTER — Encounter: Payer: Self-pay | Admitting: *Deleted

## 2015-10-27 ENCOUNTER — Other Ambulatory Visit: Payer: Self-pay

## 2015-10-27 ENCOUNTER — Encounter: Payer: Self-pay | Admitting: Physician Assistant

## 2015-10-27 DIAGNOSIS — E119 Type 2 diabetes mellitus without complications: Secondary | ICD-10-CM

## 2015-10-27 DIAGNOSIS — I10 Essential (primary) hypertension: Secondary | ICD-10-CM

## 2015-10-27 MED ORDER — ATENOLOL 50 MG PO TABS: 50.0000 mg | ORAL_TABLET | Freq: Every day | ORAL | Status: DC

## 2015-10-27 MED ORDER — LOSARTAN POTASSIUM 100 MG PO TABS: 100.0000 mg | ORAL_TABLET | Freq: Every morning | ORAL | Status: DC

## 2015-10-27 MED ORDER — SITAGLIPTIN PHOSPHATE 100 MG PO TABS: 100.0000 mg | ORAL_TABLET | Freq: Every day | ORAL | Status: DC

## 2015-10-27 MED ORDER — METFORMIN HCL ER 500 MG PO TB24: ORAL_TABLET | ORAL | Status: DC

## 2015-10-28 MED ORDER — SULFAMETHOXAZOLE-TRIMETHOPRIM 800-160 MG PO TABS: 1.0000 | ORAL_TABLET | Freq: Two times a day (BID) | ORAL | Status: DC

## 2015-11-02 ENCOUNTER — Other Ambulatory Visit: Payer: Self-pay | Admitting: *Deleted

## 2015-11-02 MED ORDER — ONETOUCH ULTRA BLUE VI STRP: 1.0000 | ORAL_STRIP | Freq: Two times a day (BID) | Status: DC

## 2015-11-02 MED ORDER — AMLODIPINE BESYLATE 10 MG PO TABS: 10.0000 mg | ORAL_TABLET | Freq: Every day | ORAL | Status: DC

## 2015-11-24 ENCOUNTER — Other Ambulatory Visit: Payer: Self-pay

## 2015-11-24 DIAGNOSIS — E119 Type 2 diabetes mellitus without complications: Secondary | ICD-10-CM

## 2015-11-24 MED ORDER — GLIPIZIDE 5 MG PO TABS
ORAL_TABLET | ORAL | Status: DC
Start: 2015-11-24 — End: 2015-12-14

## 2015-12-14 ENCOUNTER — Encounter: Payer: Self-pay | Admitting: Internal Medicine

## 2015-12-14 ENCOUNTER — Ambulatory Visit (INDEPENDENT_AMBULATORY_CARE_PROVIDER_SITE_OTHER): Payer: Managed Care, Other (non HMO) | Admitting: Internal Medicine

## 2015-12-14 VITALS — BP 118/84 | HR 74 | Ht 68.0 in | Wt 187.0 lb

## 2015-12-14 DIAGNOSIS — I159 Secondary hypertension, unspecified: Secondary | ICD-10-CM | POA: Diagnosis not present

## 2015-12-14 LAB — POCT GLYCOSYLATED HEMOGLOBIN (HGB A1C): Hemoglobin A1C: 8.9

## 2015-12-14 MED ORDER — SPIRONOLACTONE 100 MG PO TABS: 50.0000 mg | ORAL_TABLET | Freq: Every day | ORAL | Status: DC

## 2015-12-14 MED ORDER — METFORMIN HCL ER 500 MG PO TB24: 500.0000 mg | ORAL_TABLET | Freq: Every day | ORAL | Status: DC

## 2015-12-14 MED ORDER — DEXAMETHASONE 1 MG PO TABS: ORAL_TABLET | ORAL | Status: DC

## 2015-12-14 NOTE — Progress Notes (Signed)
Patient ID: Kristin Hess, female   DOB: 06-16-1965, 50 y.o.   MRN: [id number]  HPI: Kristin Hess is a 50 y.o.-year-old female, referred by her PCP, Dr. Melford Aase, for management of DM2, dx in 2012, prev. GDM in 2000, non-insulin-dependent, uncontrolled, with complications (?gastroparesis, poss. PN) and hypertension associated with hypokalemia.  DM2: Last hemoglobin A1c was: 08/2015: HbA1c 12.5% Lab Results  Component Value Date   HGBA1C 6.8* 10/30/2013   HGBA1C 9.9* 07/30/2013   Pt is on: - Chromium OTC (strated 2 days ago) Also takes vinegar, water with lemon, Cordamom tea. She was started on Glipizide 5 mg 2x a day before meals >> fear of hypoglycemia SEs She was on Metformin ER 500 mg 2x a day, with meals >> AP. Stopped 1 mo ago. She also stopped Januvia 100 mg in am >> severe UTI. She was on Invokana >> yeast infections.  Pt checks her sugars 1x a day and they are: - am: 140-160 >> 200s recently (stress - separating from husband) - 2h after b'fast: n/c - before lunch: n/c - 2h after lunch: n/c - before dinner: n/c - 2h after dinner: 160s - bedtime: n/c - nighttime: n/c No lows. Lowest sugar was 118; she has hypoglycemia awareness at 100.  Highest sugar was 260.  Glucometer: One Touch Ultra   Pt's meals are: - Breakfast: oatmeal, bacon/eggs, toast - Lunch: tuna, sub sandwich, Healthy Choice dinners, tea - Dinner: grilled chicken, veggies, tea - Snacks: hummus with chips, pretzels, fruit, veggies She has tried a vegan diet before and plans to return to this. She walks once a week for exercise: <1 mile.   - no CKD, last BUN/creatinine:  Lab Results  Component Value Date   BUN 19 10/07/2015   BUN 16 10/05/2015   CREATININE 0.89 10/07/2015   CREATININE 0.91 10/05/2015  On Losartan. - last set of lipids: Lab Results  Component Value Date   CHOL 182 09/08/2015   HDL 69 09/08/2015   LDLCALC 96 09/08/2015   TRIG 85 09/08/2015   CHOLHDL 2.6 09/08/2015   -  last eye exam was in 07/2015. No DR.  - + numbness and tingling in her feet. More in heels.  She has gastroparesis, for which he was taking Reglan.   Pt has FH of DM in mother and father.  She has a h/o TIA in 07/2014. She is on Plavix. She is also on ASA 81 mg daily.  Patient also has a history of hypertension associated with hypokalemia. - dx during pregnancy in 2000. - she had a high aldosterone level and a low renin level in 2013-2014 - she was found an adrenal adenoma   Reviewed previous BP results: Vitals - 1 value per visit 10/05/2015 09/29/2015 09/21/2015 09/08/2015 0/62/6948  SYSTOLIC 546 270 [phone]  DIASTOLIC 299 [phone] 85  Pulse 103 76 67 78 77   Vitals - 1 value per visit 08/01/2014 07/06/2014 3/81/0175  SYSTOLIC 102 585 277  DIASTOLIC 92 82 83  Pulse 85 78 97   She is on the following hypertensive regimen: - Atenolol 50 mg daily - Losartan 100 mg daily - Amlodipine 10 >> 5 mg daily - Spironolactone 100 >> 50 mg daily - recently added  Her blood pressure became more controlled after the addition of spironolactone. She also feels much better after adding the medication.   I reviewed her chart, and her potassium levels have been controlled lately:  Component     Latest Ref  Rng 06/18/2014 08/01/2014 09/08/2015 10/05/2015 10/07/2015  Potassium     3.5 - 5.3 mmol/L 4.0 4.3 3.7 4.1 4.0   Of note, she had several abdominal CTs with contrast (last in 06/18/2014) and her adrenal glands appeared normal, without masses.However, patient tells be that she had a CT scan in 2013 and this showed a possible adrenal adenoma. Reviewing the imaging studies in Care everywhere, she indeed had a CT of the abdomen with and without contrast on 06/12/2011, that was originally read as showing normal adrenals, however, an addendum after reevaluating the adrenals showed: Addendum #1: Thin cut reconstructions have become available.  With these there is small nodule measuring approx 1.2 by 0.7 cm in   lateral  limb of left adrenal. This could represent either nonfunctioning or  hyperfunctioning adenoma.   She also tells me that that that time, she was found to have a high aldosterone in the low renin. I do not have these results.  She has a family history of HTN, stroke in mother (53s) and brother (at 23 y/o).  ROS: Constitutional: no weight gain/loss, no fatigue, + hot flushes Eyes: no blurry vision, no xerophthalmia ENT: no sore throat, no nodules palpated in throat, no dysphagia/odynophagia, no hoarseness, + tinnitus Cardiovascular: + CP (2/2 GERD)/SOB/+ palpitations/leg swelling Respiratory: no cough/SOB Gastrointestinal: no N/V/D/+ C, + heartburn Musculoskeletal: + muscle/+ joint aches Skin: no rashes Neurological: no tremors/numbness/tingling/dizziness Psychiatric: no depression/anxiety  Past Medical History  Diagnosis Date  . Hypertension   . Diabetes mellitus without complication ([place])   . Adrenal adenoma   . Hyperaldosteronism ([place])     Possibly  . Hypokalemia   . Hyperlipidemia   . Allergy   . Anemia   . Headache     MIGRAINES  . GERD (gastroesophageal reflux disease)   . Ovarian cyst   . Endometriosis of rectovaginal septum    Past Surgical History  Procedure Laterality Date  . Cesarean section      X 2  . Cardiac catheterization    . Tubal ligation    . Robotic assisted total hysterectomy with bilateral salpingo oopherectomy Bilateral 06/16/2014    Procedure: ROBOTIC ASSISTED TOTAL HYSTERECTOMY WITH BILATERAL SALPINGO OOPHORECTOMY, LYSIS OF ADHESIONS;  Surgeon: Everitt Amber, MD;  Location: [facility];  Service: Gynecology;  Laterality: Bilateral;   Social History   Social History  . Marital Status: Married    Spouse Name: Richard  . Number of Children: 2  . Years of Education: 14   Occupational History  . Works from home at [employer]  . Smoking status: Never Smoker   . Smokeless tobacco: Not on file  . Alcohol Use:  No  . Drug Use: No   Social History Narrative   Lives at home aw/ husband and youngest daughter   Right-handed   Drinks about 16-32 oz of caffeinated beverages per day   Current Outpatient Prescriptions on File Prior to Visit  Medication Sig Dispense Refill  . aspirin 81 MG EC tablet Take 81 mg by mouth every morning.  0  . atenolol (TENORMIN) 50 MG tablet Take 1 tablet (50 mg total) by mouth daily. 30 tablet 11  . Blood Glucose Monitoring Suppl (ONE TOUCH ULTRA MINI) w/Device KIT See admin instructions. for testing  0  . Iron TABS Take 1 tablet by mouth daily. Reported on 09/08/2015    . losartan (COZAAR) 100 MG tablet Take 1 tablet (100 mg total) by mouth every morning. 30 tablet  3  . Magnesium 250 MG TABS Take by mouth.    . ONE TOUCH ULTRA TEST test strip 1 each by Other route 2 (two) times daily. for testing 100 each 0  . Vitamin D, Ergocalciferol, (DRISDOL) 50000 units CAPS capsule Take 50,000 Units by mouth every 7 (seven) days. Reported on 09/29/2015    . amLODipine (NORVASC) 10 MG tablet Take 1 tablet (10 mg total) by mouth daily. (Patient not taking: Reported on 12/14/2015) 90 tablet 1  . clopidogrel (PLAVIX) 75 MG tablet Take 75 mg by mouth every morning. Reported on 12/14/2015  0  . Cyanocobalamin (B-12 PO) Take 1 tablet by mouth daily. Reported on 12/14/2015    . glipiZIDE (GLUCOTROL) 5 MG tablet 1/2 tablet daily WITH FOOD (Patient not taking: Reported on 12/14/2015) 90 tablet 3  . metFORMIN (GLUCOPHAGE XR) 500 MG 24 hr tablet Take 2 tablets twice daily with meals (Patient not taking: Reported on 12/14/2015) 120 tablet 3  . metoCLOPramide (REGLAN) 5 MG tablet Take 1 tablet (5 mg total) by mouth 3 (three) times daily before meals. (Patient not taking: Reported on 12/14/2015) 60 tablet 1  . pantoprazole (PROTONIX) 40 MG tablet TAKE 1 TABLET (40 MG TOTAL) BY MOUTH DAILY. (Patient not taking: Reported on 12/14/2015) 90 tablet 0  . sitaGLIPtin (JANUVIA) 100 MG tablet Take 1 tablet (100 mg  total) by mouth daily. (Patient not taking: Reported on 12/14/2015) 30 tablet 6  . sulfamethoxazole-trimethoprim (BACTRIM DS,SEPTRA DS) 800-160 MG tablet Take 1 tablet by mouth 2 (two) times daily. (Patient not taking: Reported on 12/14/2015) 14 tablet 0   No current facility-administered medications on file prior to visit.   Allergies  Allergen Reactions  . Ace Inhibitors Cough  . Hctz [Hydrochlorothiazide] Cough   Family History  Problem Relation Age of Onset  . Diabetes Father   . Hepatitis C Father   . Stroke Mother   . Diabetes Mother   . Stroke Brother    PE: BP 118/84 mmHg  Pulse 74  Ht '5\' 8"'  (1.727 m)  Wt 187 lb (84.823 kg)  BMI 28.44 kg/m2  SpO2 96%  LMP 05/16/2014 Wt Readings from Last 3 Encounters:  12/14/15 187 lb (84.823 kg)  10/05/15 188 lb 3.2 oz (85.367 kg)  09/29/15 193 lb (87.544 kg)   Constitutional: overweight, in NAD Eyes: PERRLA, EOMI, no exophthalmos ENT: moist mucous membranes, no thyromegaly, no cervical lymphadenopathy Cardiovascular: RRR, No MRG Respiratory: CTA B Gastrointestinal: abdomen soft, NT, ND, BS+ Musculoskeletal: no deformities, strength intact in all 4 Skin: moist, warm, no rashes Neurological: no tremor with outstretched hands, DTR normal in all 4  ASSESSMENT: 1. DM2, non-insulin-dependent, uncontrolled, without complications ? Gastroparesis ? PN  2. HTN  PLAN:  1. Patient with long-standing, uncontrolled diabetes, off all oral antidiabetic agent due to either fear of or actually developing side effects, Her sugars improved since her last HbA1c check due to diet, but they started to increase in last 1-2 weeks 2/2 stress and not eating as well. She would like to avoid meds, and would like to go back to a vegan or at least a more plant-based diet. This would be very salutary for her DM2, however, as sugars are so high in am and they decrease later in the day, I suggested to try to restart the Metformin ER >> she agrees. - I  suggested to:  Patient Instructions  Please start Metformin ER 500 mg with dinner for the next week. If you tolerate this well, increase  to 1000 mg daily.  Please stop at the lab.  Please take Dexamethasone at 11 pm the night before coming for a cortisol level.  Please return in 1.5 months with your sugar log.   - Strongly advised her to start checking sugars at different times of the day - check 2 times a day, rotating checks - given sugar log and advised how to fill it and to bring it at next appt  - given foot care handout and explained the principles  - given instructions for hypoglycemia management "15-15 rule"  - advised for yearly eye exams  - today, HbA1c 8.9% (better) - Return to clinic in 1.5 mo with sugar log   2. HTN - I reviewed her BP, potassium levels, imaging studies (both images and reports) and BP meds. - She has a long history of hypertension, and also a family history of the disease. - She had hypokalemia in the past, however, this improved on ARB and spironolactone - most recent potassium levels have been normal - she describes that she had investigations for her high blood pressure in the past (2013) But I do not have older records. She had an abdominal CT that was initially read to contain normal adrenals, however, an addendum after thin cut reconstruction of the adrenals mentioned a small, 1.2 x 0.7 cm nodule in the lateral limb of the left adrenal. She was told at that time that this is not worrisome and patient did not return for further investigation. She remembers being told that she had a high aldosterone in the low renin in 2013, but I do not have these results. - she feels much better after adding Spironolactone and her BP became more controlled since then. - We discussed about possible endocrine causes of hypertension. She had a negative investigation for thyroid disease (recent TSH was normal), and she also had a normal cortisol level checked in 2015;  but I  suggested to check for pheochromocytoma and Cushing syndrome especially in the light of her adrenal adenoma. She had several CT scans since then, each of them mentioning normal adrenal glands. I would not recommend further imaging for now.  - We discussed at length about testing her for hyperaldosteronism. I explained if she has this, her aldosterone could be either secreted by her left adrenal adenoma or by both adrenals. In the first case, this is operable, in the second case, this is treated with spironolactone. The sequence of events would be the following:  1. stop ARB and spironolactone for 6 weeks and replace with drugs that would not interfere with the aldosterone testing (e.g. doxazosin, amiloride)  2. Check PRA and aldosterone levels and if suggestive of hyperaldosteronism, proceed with : 3. Saline suppression test - If aldosterone level not suppressed, proceed with:  4. Adrenal venous sampling to localize the overproduction  - I explained that the above sequence of tests is only necessary if she agrees to have surgery for removal of the adrenal secreting extra aldosterone. I explained that the outcomes of the surgery are not 100% resolution of the hypertension, however, it improves the effect of aldosterone excess on the cardiovascular system, with subsequent reduction of cardiovascular events.  - I asked her to think about the above and let me know if she wants to proceed with this or continue with the spironolactone; she will let me know.   Orders Placed This Encounter  Procedures  . Metanephrines, plasma  . Catecholamines, fractionated, plasma  . Dexamethasone, blood  .  Cortisol-am, blood   - time spent with the patient: 1 hour, of which >50% Of the time was spent in obtaining information about her symptoms, reviewing her previous labs, evaluations, and treatments, counseling her about her conditions (please see the discussed topics above), and developing a plan to further investigate  and tx them; she had a number of questions which I addressed.  Component     Latest Ref Rng & Units 12/14/2015  Epinephrine     pg/mL 20  Norepinephrine     pg/mL 383  Dopamine      REPORT  Catecholamines, Total     pg/mL 403  Metanephrine, Pl     <=57 pg/mL <25  Normetanephrine, Pl     <=148 pg/mL 82  Total Metanephrines-Plasma     <=205 pg/mL 82  Hemoglobin A1C      8.9  No signs of pheochromocytoma.  Cortisol still pending. I will addend the results when they become available.  Philemon Kingdom, MD PhD [facility] Endocrinology

## 2015-12-14 NOTE — Patient Instructions (Signed)
Please start Metformin ER 500 mg with dinner for the next week. If you tolerate this well, increase to 1000 mg daily.  Please stop at the lab.  Please take Dexamethasone at 11 pm the night before coming for a cortisol level.  Please return in 1.5 months with your sugar log.   PATIENT INSTRUCTIONS FOR TYPE 2 DIABETES:  **Please join MyChart!** - see attached instructions about how to join if you have not done so already.  DIET AND EXERCISE Diet and exercise is an important part of diabetic treatment.  We recommended aerobic exercise in the form of brisk walking (working between 40-60% of maximal aerobic capacity, similar to brisk walking) for 150 minutes per week (such as 30 minutes five days per week) along with 3 times per week performing 'resistance' training (using various gauge rubber tubes with handles) 5-10 exercises involving the major muscle groups (upper body, lower body and core) performing 10-15 repetitions (or near fatigue) each exercise. Start at half the above goal but build slowly to reach the above goals. If limited by weight, joint pain, or disability, we recommend daily walking in a swimming pool with water up to waist to reduce pressure from joints while allow for adequate exercise.    BLOOD GLUCOSES Monitoring your blood glucoses is important for continued management of your diabetes. Please check your blood glucoses 2-4 times a day: fasting, before meals and at bedtime (you can rotate these measurements - e.g. one day check before the 3 meals, the next day check before 2 of the meals and before bedtime, etc.).   HYPOGLYCEMIA (low blood sugar) Hypoglycemia is usually a reaction to not eating, exercising, or taking too much insulin/ other diabetes drugs.  Symptoms include tremors, sweating, hunger, confusion, headache, etc. Treat IMMEDIATELY with 15 grams of Carbs: . 4 glucose tablets .  cup regular juice/soda . 2 tablespoons raisins . 4 teaspoons sugar . 1 tablespoon  honey Recheck blood glucose in 15 mins and repeat above if still symptomatic/blood glucose <100.  RECOMMENDATIONS TO REDUCE YOUR RISK OF DIABETIC COMPLICATIONS: * Take your prescribed MEDICATION(S) * Follow a DIABETIC diet: Complex carbs, fiber rich foods, (monounsaturated and polyunsaturated) fats * AVOID saturated/trans fats, high fat foods, >2,300 mg salt per day. * EXERCISE at least 5 times a week for 30 minutes or preferably daily.  * DO NOT SMOKE OR DRINK more than 1 drink a day. * Check your FEET every day. Do not wear tightfitting shoes. Contact us if you develop an ulcer * See your EYE doctor once a year or more if needed * Get a FLU shot once a year * Get a PNEUMONIA vaccine once before and once after age 66 years  GOALS:  * Your Hemoglobin A1c of <7%  * fasting sugars need to be <130 * after meals sugars need to be <180 (2h after you start eating) * Your Systolic BP should be [id number] or lower  * Your Diastolic BP should be 80 or lower  * Your HDL (Good Cholesterol) should be 40 or higher  * Your LDL (Bad Cholesterol) should be 100 or lower. * Your Triglycerides should be 150 or lower  * Your Urine microalbumin (kidney function) should be <30 * Your Body Mass Index should be 25 or lower    Please consider the following ways to cut down carbs and fat and increase fiber and micronutrients in your diet: - substitute whole grain for white bread or pasta - substitute brown rice for white rice -  substitute 90-calorie flat bread pieces for slices of bread when possible - substitute sweet potatoes or yams for white potatoes - substitute humus for margarine - substitute tofu for cheese when possible - substitute almond or rice milk for regular milk (would not drink soy milk daily due to concern for soy estrogen influence on breast cancer risk) - substitute dark chocolate for other sweets when possible - substitute water - can add lemon or orange slices for taste - for diet sodas  (artificial sweeteners will trick your body that you can eat sweets without getting calories and will lead you to overeating and weight gain in the long run) - do not skip breakfast or other meals (this will slow down the metabolism and will result in more weight gain over time)  - can try smoothies made from fruit and almond/rice milk in am instead of regular breakfast - can also try old-fashioned (not instant) oatmeal made with almond/rice milk in am - order the dressing on the side when eating salad at a restaurant (pour less than half of the dressing on the salad) - eat as little meat as possible - can try juicing, but should not forget that juicing will get rid of the fiber, so would alternate with eating raw veg./fruits or drinking smoothies - use as little oil as possible, even when using olive oil - can dress a salad with a mix of balsamic vinegar and lemon juice, for e.g. - use agave nectar, stevia sugar, or regular sugar rather than artificial sweateners - steam or broil/roast veggies  - snack on veggies/fruit/nuts (unsalted, preferably) when possible, rather than processed foods - reduce or eliminate aspartame in diet (it is in diet sodas, chewing gum, etc) Read the labels!  Try to read Dr. Janene Harvey book: "Program for Reversing Diabetes" for other ideas for healthy eating.

## 2015-12-17 ENCOUNTER — Other Ambulatory Visit: Payer: Managed Care, Other (non HMO)

## 2015-12-17 LAB — METANEPHRINES, PLASMA
Metanephrine, Free: <25 pg/mL (ref <=57)
Normetanephrine, Free: 82 pg/mL (ref <=148)
TOTAL METANEPHRINES-PLASMA: 82 pg/mL (ref <=205)

## 2015-12-18 LAB — CATECHOLAMINES, FRACTIONATED, PLASMA
CATECHOLAMINES, TOTAL: 403 pg/mL
Epinephrine: 20 pg/mL
Norepinephrine: 383 pg/mL

## 2016-01-25 ENCOUNTER — Ambulatory Visit: Payer: Managed Care, Other (non HMO) | Admitting: Internal Medicine

## 2016-01-31 ENCOUNTER — Ambulatory Visit: Payer: Managed Care, Other (non HMO) | Admitting: Internal Medicine

## 2016-02-01 ENCOUNTER — Other Ambulatory Visit: Payer: Managed Care, Other (non HMO)

## 2016-02-18 ENCOUNTER — Other Ambulatory Visit: Payer: Self-pay

## 2016-02-18 MED ORDER — METFORMIN HCL ER 500 MG PO TB24: 500.0000 mg | ORAL_TABLET | Freq: Every day | ORAL | 1 refills | Status: DC

## 2016-02-22 ENCOUNTER — Other Ambulatory Visit: Payer: Self-pay

## 2016-02-22 MED ORDER — METFORMIN HCL ER 500 MG PO TB24
500.0000 mg | ORAL_TABLET | Freq: Every day | ORAL | 0 refills | Status: DC
Start: 2016-02-22 — End: 2016-11-28

## 2016-02-28 ENCOUNTER — Other Ambulatory Visit: Payer: Self-pay

## 2016-02-28 DIAGNOSIS — I10 Essential (primary) hypertension: Secondary | ICD-10-CM

## 2016-02-28 MED ORDER — LOSARTAN POTASSIUM 100 MG PO TABS: 100.0000 mg | ORAL_TABLET | Freq: Every morning | ORAL | 3 refills | Status: DC

## 2016-03-01 ENCOUNTER — Other Ambulatory Visit: Payer: Self-pay | Admitting: Physician Assistant

## 2016-03-01 DIAGNOSIS — I10 Essential (primary) hypertension: Secondary | ICD-10-CM

## 2016-03-01 MED ORDER — LOSARTAN POTASSIUM 100 MG PO TABS: 100.0000 mg | ORAL_TABLET | Freq: Every morning | ORAL | 0 refills | Status: DC

## 2016-03-27 ENCOUNTER — Ambulatory Visit: Payer: Managed Care, Other (non HMO) | Admitting: Internal Medicine

## 2016-04-05 ENCOUNTER — Encounter: Payer: Self-pay | Admitting: Physician Assistant

## 2016-04-05 ENCOUNTER — Ambulatory Visit (INDEPENDENT_AMBULATORY_CARE_PROVIDER_SITE_OTHER): Payer: Managed Care, Other (non HMO) | Admitting: Physician Assistant

## 2016-04-05 VITALS — BP 122/74 | HR 78 | Temp 97.3°F | Resp 14 | Ht 68.0 in | Wt 189.0 lb

## 2016-04-05 DIAGNOSIS — E119 Type 2 diabetes mellitus without complications: Secondary | ICD-10-CM

## 2016-04-05 DIAGNOSIS — M791 Myalgia, unspecified site: Secondary | ICD-10-CM

## 2016-04-05 DIAGNOSIS — E785 Hyperlipidemia, unspecified: Secondary | ICD-10-CM | POA: Diagnosis not present

## 2016-04-05 DIAGNOSIS — E876 Hypokalemia: Secondary | ICD-10-CM

## 2016-04-05 DIAGNOSIS — R202 Paresthesia of skin: Secondary | ICD-10-CM

## 2016-04-05 DIAGNOSIS — E559 Vitamin D deficiency, unspecified: Secondary | ICD-10-CM

## 2016-04-05 DIAGNOSIS — I10 Essential (primary) hypertension: Secondary | ICD-10-CM

## 2016-04-05 LAB — CBC WITH DIFFERENTIAL/PLATELET
Basophils Absolute: 0 cells/uL (ref 0–200)
Basophils Relative: 0 %
Eosinophils Absolute: 62 cells/uL (ref 15–500)
Eosinophils Relative: 1 %
HEMATOCRIT: 41.2 % (ref 35.0–45.0)
Hemoglobin: 13 g/dL (ref 11.7–15.5)
LYMPHS PCT: 30 %
Lymphs Abs: 1860 cells/uL (ref 850–3900)
MCH: 26.8 pg — ABNORMAL LOW (ref 27.0–33.0)
MCHC: 31.6 g/dL — AB (ref 32.0–36.0)
MCV: 84.9 fL (ref 80.0–100.0)
MONO ABS: 310 {cells}/uL (ref 200–950)
MONOS PCT: 5 %
MPV: 10.7 fL (ref 7.5–12.5)
NEUTROS PCT: 64 %
Neutro Abs: 3968 cells/uL (ref 1500–7800)
PLATELETS: 200 10*3/uL (ref 140–400)
RBC: 4.85 MIL/uL (ref 3.80–5.10)
RDW: 13.9 % (ref 11.0–15.0)
WBC: 6.2 10*3/uL (ref 3.8–10.8)

## 2016-04-05 NOTE — Progress Notes (Signed)
Assessment and Plan:   Hypertension -Continue medication, monitor blood pressure at home. Continue DASH diet.  Reminder to go to the ER if any CP, SOB, nausea, dizziness, severe HA, changes vision/speech, left arm numbness and tingling and jaw pain.  Cholesterol -Continue diet and exercise. Check cholesterol.    Diabetes without complications -Continue diet and exercise. Check A1C  Vitamin D Def - check level and continue medications.   Bilateral arms and legs paraesthesias + neck pain, ? Radicular but normal exam- will get xray and labs to rule out lupus.  Had MRI 2016, showing T2 hyperintensities but has seen neuro- states no MS Saw Dr. Willis, will get notes ? Suggest cymbalta for depression/anxiety as well, declines at this time.   Continue diet and meds as discussed. Further disposition pending results of labs. Discussed med's effects and SE's.   Over 30 minutes of exam, counseling, chart review, and critical decision making was performed No future appointments.   HPI 50 y.o. female  presents for 3 month follow up on hypertension, cholesterol, diabetes and vitamin D deficiency.   She has been having bilateral shoulder pain and bilateral leg pain getting worse over 7 month, and new left foot numbness x 2-4 weeks.   Her blood pressure has been controlled at home, today their BP is BP: 122/74  She does not workout. She denies chest pain, shortness of breath, dizziness.  She is not on cholesterol medication and denies myalgias. Her cholesterol is at goal. The cholesterol last visit was:   Lab Results  Component Value Date   CHOL 182 09/08/2015   HDL 69 09/08/2015   LDLCALC 96 09/08/2015   TRIG 85 09/08/2015   CHOLHDL 2.6 09/08/2015   She has been working on diet and exercise for Diabetes without complications,she has not checked her sugar at home, she is on MF she is on bASA, she is on ACE/ARB, and denies paresthesia of the feet, polydipsia, polyuria and visual disturbances.  Last A1C was:  Lab Results  Component Value Date   HGBA1C 8.9 12/14/2015   Lab Results  Component Value Date   GFRAA 87 10/07/2015   Patient is on Vitamin D supplement.   Lab Results  Component Value Date   VD25OH 35 09/08/2015        Current Medications:  Current Outpatient Prescriptions on File Prior to Visit  Medication Sig Dispense Refill  . amLODipine (NORVASC) 10 MG tablet Take 1 tablet (10 mg total) by mouth daily. 90 tablet 1  . aspirin 81 MG EC tablet Take 81 mg by mouth every morning.  0  . atenolol (TENORMIN) 50 MG tablet Take 1 tablet (50 mg total) by mouth daily. 30 tablet 11  . Blood Glucose Monitoring Suppl (ONE TOUCH ULTRA MINI) w/Device KIT See admin instructions. for testing  0  . clopidogrel (PLAVIX) 75 MG tablet Take 75 mg by mouth every morning. Reported on 12/14/2015  0  . Cyanocobalamin (B-12 PO) Take 1 tablet by mouth daily. Reported on 12/14/2015    . Iron TABS Take 1 tablet by mouth daily. Reported on 09/08/2015    . losartan (COZAAR) 100 MG tablet Take 1 tablet (100 mg total) by mouth every morning. 90 tablet 0  . Magnesium 250 MG TABS Take by mouth.    . metFORMIN (GLUCOPHAGE-XR) 500 MG 24 hr tablet Take 1 tablet (500 mg total) by mouth daily with breakfast. 90 tablet 0  . ONE TOUCH ULTRA TEST test strip 1 each by Other route   2 (two) times daily. for testing 100 each 0  . spironolactone (ALDACTONE) 100 MG tablet Take 0.5 tablets (50 mg total) by mouth daily. 60 tablet 1  . Vitamin D, Ergocalciferol, (DRISDOL) 50000 units CAPS capsule Take 50,000 Units by mouth every 7 (seven) days. Reported on 09/29/2015    . dexamethasone (DECADRON) 1 MG tablet Take 1 tablet by mouth once at 11 pm, before coming for labs at 8 am the next morning (Patient not taking: Reported on 04/05/2016) 1 tablet 0   No current facility-administered medications on file prior to visit.    Medical History:  Past Medical History:  Diagnosis Date  . Adrenal adenoma   . Allergy   . Anemia    . Diabetes mellitus without complication (HCC)   . Endometriosis of rectovaginal septum   . GERD (gastroesophageal reflux disease)   . Headache    MIGRAINES  . Hyperaldosteronism (HCC)    Possibly  . Hyperlipidemia   . Hypertension   . Hypokalemia   . Ovarian cyst    Allergies:  Allergies  Allergen Reactions  . Ace Inhibitors Cough  . Hctz [Hydrochlorothiazide] Cough     Review of Systems:  Review of Systems  Constitutional: Negative.   Respiratory: Negative.   Cardiovascular: Negative.   Gastrointestinal: Negative.   Genitourinary: Negative.   Musculoskeletal: Positive for back pain, myalgias and neck pain. Negative for falls and joint pain.  Skin: Negative.   Neurological: Positive for tingling, sensory change and headaches. Negative for dizziness, tremors, speech change, focal weakness, seizures and loss of consciousness.  Psychiatric/Behavioral: Negative.     Family history- Review and unchanged Social history- Review and unchanged Physical Exam: BP 122/74   Pulse 78   Temp 97.3 F (36.3 C)   Resp 14   Ht 5' 8" (1.727 m)   Wt 189 lb (85.7 kg)   LMP 05/16/2014   SpO2 97%   BMI 28.74 kg/m  Wt Readings from Last 3 Encounters:  04/05/16 189 lb (85.7 kg)  12/14/15 187 lb (84.8 kg)  10/05/15 188 lb 3.2 oz (85.4 kg)   General Appearance: Well nourished, in no apparent distress. Eyes: PERRLA, EOMs, conjunctiva no swelling or erythema Sinuses: No Frontal/maxillary tenderness ENT/Mouth: Ext aud canals clear, TMs without erythema, bulging. No erythema, swelling, or exudate on post pharynx.  Tonsils not swollen or erythematous. Hearing normal.  Neck: Supple, thyroid normal.  Respiratory: Respiratory effort normal, BS equal bilaterally without rales, rhonchi, wheezing or stridor.  Cardio: RRR with no MRGs. Brisk peripheral pulses without edema.  Abdomen: Soft, + BS.  Non tender, no guarding, rebound, hernias, masses. Lymphatics: Non tender without lymphadenopathy.   Musculoskeletal: Full ROM, 5/5 strength, Normal gait, + neck pain with full ROM, supple, pain at traps to palpation, normal distal neurovascular exam, normal DTs, bilateral legs with normal sensation, normal strength, and normal DT Skin: Warm, dry without rashes, lesions, ecchymosis.  Neuro: Cranial nerves intact. No cerebellar symptoms.  Psych: Awake and oriented X 3, normal affect, Insight and Judgment appropriate.     , PA-C 9:55 AM [facility]  

## 2016-04-06 LAB — BASIC METABOLIC PANEL WITH GFR
BUN: 11 mg/dL (ref 7–25)
CALCIUM: 10.2 mg/dL (ref 8.6–10.4)
CO2: 24 mmol/L (ref 20–31)
Chloride: 102 mmol/L (ref 98–110)
Creat: 0.91 mg/dL (ref 0.50–1.05)
GFR, EST AFRICAN AMERICAN: 85 mL/min (ref >=60)
GFR, EST NON AFRICAN AMERICAN: 74 mL/min (ref >=60)
GLUCOSE: 220 mg/dL — AB (ref 65–99)
Potassium: 4.1 mmol/L (ref 3.5–5.3)
Sodium: 137 mmol/L (ref 135–146)

## 2016-04-06 LAB — HEPATIC FUNCTION PANEL
ALBUMIN: 4.6 g/dL (ref 3.6–5.1)
ALT: 18 U/L (ref 6–29)
AST: 15 U/L (ref 10–35)
Alkaline Phosphatase: 92 U/L (ref 33–130)
BILIRUBIN INDIRECT: 0.8 mg/dL (ref 0.2–1.2)
BILIRUBIN TOTAL: 1 mg/dL (ref 0.2–1.2)
Bilirubin, Direct: 0.2 mg/dL (ref <=0.2)
TOTAL PROTEIN: 7.4 g/dL (ref 6.1–8.1)

## 2016-04-06 LAB — ANTI-NUCLEAR AB-TITER (ANA TITER): ANA Titer 1: 1:80 {titer} — ABNORMAL HIGH

## 2016-04-06 LAB — LIPID PANEL
CHOLESTEROL: 204 mg/dL — AB (ref 125–200)
HDL: 69 mg/dL (ref >=46)
LDL Cholesterol: 121 mg/dL (ref <130)
TRIGLYCERIDES: 72 mg/dL (ref <150)
Total CHOL/HDL Ratio: 3 Ratio (ref <=5.0)
VLDL: 14 mg/dL (ref <30)

## 2016-04-06 LAB — TSH: TSH: 1.66 mIU/L

## 2016-04-06 LAB — SEDIMENTATION RATE: SED RATE: 8 mm/h (ref 0–20)

## 2016-04-06 LAB — HIV ANTIBODY (ROUTINE TESTING W REFLEX): HIV: NONREACTIVE

## 2016-04-06 LAB — ANTI-DNA ANTIBODY, DOUBLE-STRANDED: ds DNA Ab: 1 IU/mL

## 2016-04-06 LAB — RPR

## 2016-04-06 LAB — ANA: ANA: POSITIVE — AB

## 2016-04-06 LAB — VITAMIN B12: VITAMIN B 12: 445 pg/mL (ref 200–1100)

## 2016-04-06 LAB — CK: CK TOTAL: 69 U/L (ref 7–177)

## 2016-04-06 LAB — HEMOGLOBIN A1C
Hgb A1c MFr Bld: 10 % — ABNORMAL HIGH (ref <5.7)
Mean Plasma Glucose: 240 mg/dL

## 2016-04-06 LAB — MAGNESIUM: MAGNESIUM: 1.9 mg/dL (ref 1.5–2.5)

## 2016-04-06 LAB — VITAMIN D 25 HYDROXY (VIT D DEFICIENCY, FRACTURES): VIT D 25 HYDROXY: 38 ng/mL (ref 30–100)

## 2016-04-07 LAB — LYME ABY, WSTRN BLT IGG & IGM W/BANDS
B BURGDORFERI IGG ABS (IB): NEGATIVE
B burgdorferi IgM Abs (IB): NEGATIVE
LYME DISEASE 18 KD IGG: NONREACTIVE
LYME DISEASE 23 KD IGG: NONREACTIVE
LYME DISEASE 23 KD IGM: NONREACTIVE
LYME DISEASE 39 KD IGM: NONREACTIVE
LYME DISEASE 41 KD IGM: NONREACTIVE
Lyme Disease 28 kD IgG: NONREACTIVE
Lyme Disease 30 kD IgG: NONREACTIVE
Lyme Disease 39 kD IgG: NONREACTIVE
Lyme Disease 41 kD IgG: NONREACTIVE
Lyme Disease 45 kD IgG: NONREACTIVE
Lyme Disease 58 kD IgG: NONREACTIVE
Lyme Disease 66 kD IgG: NONREACTIVE
Lyme Disease 93 kD IgG: NONREACTIVE

## 2016-04-10 ENCOUNTER — Other Ambulatory Visit: Payer: 59

## 2016-04-10 ENCOUNTER — Ambulatory Visit (HOSPITAL_COMMUNITY)
Admission: RE | Admit: 2016-04-10 | Discharge: 2016-04-10 | Disposition: A | Discharge status: Home / Self Care | Payer: Managed Care, Other (non HMO) | Source: Ambulatory Visit | Attending: Physician Assistant | Admitting: Physician Assistant

## 2016-04-10 ENCOUNTER — Other Ambulatory Visit: Payer: Self-pay | Admitting: Physician Assistant

## 2016-04-10 DIAGNOSIS — R202 Paresthesia of skin: Secondary | ICD-10-CM

## 2016-04-10 DIAGNOSIS — M791 Myalgia, unspecified site: Secondary | ICD-10-CM

## 2016-04-10 DIAGNOSIS — M542 Cervicalgia: Secondary | ICD-10-CM | POA: Insufficient documentation

## 2016-04-10 DIAGNOSIS — M5136 Other intervertebral disc degeneration, lumbar region: Secondary | ICD-10-CM | POA: Diagnosis not present

## 2016-04-11 LAB — C3 AND C4
C3 COMPLEMENT: 153 mg/dL (ref 90–180)
C4 COMPLEMENT: 35 mg/dL (ref 16–47)

## 2016-04-11 LAB — SJOGRENS SYNDROME-B EXTRACTABLE NUCLEAR ANTIBODY: SSB (La) (ENA) Antibody, IgG: <1

## 2016-04-11 LAB — RNP ANTIBODY: Ribonucleic Protein(ENA) Antibody, IgG: <1

## 2016-04-11 LAB — SJOGRENS SYNDROME-A EXTRACTABLE NUCLEAR ANTIBODY: SSA (Ro) (ENA) Antibody, IgG: <1

## 2016-04-11 LAB — ANTI-SMITH ANTIBODY: ENA SM Ab Ser-aCnc: <1

## 2016-04-11 LAB — CYCLIC CITRUL PEPTIDE ANTIBODY, IGG: Cyclic Citrullin Peptide Ab: <16 Units

## 2016-04-12 ENCOUNTER — Other Ambulatory Visit: Payer: Self-pay

## 2016-04-12 LAB — COMPLEMENT, TOTAL: Compl, Total (CH50): >60 U/mL — ABNORMAL HIGH (ref 31–60)

## 2016-10-12 ENCOUNTER — Encounter: Payer: Self-pay | Admitting: Internal Medicine

## 2016-10-12 ENCOUNTER — Ambulatory Visit (INDEPENDENT_AMBULATORY_CARE_PROVIDER_SITE_OTHER): Payer: 59 | Admitting: Internal Medicine

## 2016-10-12 VITALS — BP 108/72 | HR 82 | Ht 68.0 in | Wt 189.0 lb

## 2016-10-12 DIAGNOSIS — D3500 Benign neoplasm of unspecified adrenal gland: Secondary | ICD-10-CM | POA: Insufficient documentation

## 2016-10-12 DIAGNOSIS — E119 Type 2 diabetes mellitus without complications: Secondary | ICD-10-CM

## 2016-10-12 LAB — POCT GLYCOSYLATED HEMOGLOBIN (HGB A1C): HEMOGLOBIN A1C: 10.4

## 2016-10-12 MED ORDER — SPIRONOLACTONE 50 MG PO TABS: ORAL_TABLET | ORAL | 3 refills | Status: DC

## 2016-10-12 NOTE — Progress Notes (Signed)
Patient ID: Kristin Hess, female   DOB: Oct 23, 1965, 51 y.o.   MRN: [id number]  HPI: Kristin Hess is a 51 y.o.-year-old female, returning for f/u for DM2, dx in 2012, prev. GDM in 2000, non-insulin-dependent, uncontrolled, with complications (?gastroparesis, poss. PN) and hypertension associated with hypokalemia. Last visit 10 mo ago!  DM2: Last hemoglobin A1c was:  Lab Results  Component Value Date   HGBA1C 10.0 (H) 04/05/2016   HGBA1C 8.9 12/14/2015   HGBA1C 6.8 (H) 10/30/2013  08/2015: HbA1c 12.5%  Pt is on: - Metformin ER 500 mg in am - started yesterday... Also takes vinegar, water with lemon, Cordamom tea. She was started on Glipizide 5 mg 2x a day before meals >> fear of hypoglycemia SEs She was on Metformin ER 500 mg 2x a day, with meals >> AP. Stopped 1 mo ago. She also stopped Januvia 100 mg in am >> severe UTI. She was on Invokana >> yeast infections.  Pt checks her sugars 0-2 a day and they are: - am: 140-160 >> 200s recently (stress - separating from husband) >> 247, 217, 206, 188 (improving) - 2h after b'fast: n/c >> 166 - before lunch: n/c  - 2h after lunch: n/c >> 160, 187 - before dinner: n/c - 2h after dinner: 160s >> n/c - bedtime: n/c - nighttime: n/c No lows. Lowest sugar was 118 >> 160; she has hypoglycemia awareness at 100.  Highest sugar was 260 >> 247.  Glucometer: One Touch Ultra   She is eating a more plant-based diet.  She restarted walking.  - no CKD, last BUN/creatinine:  Lab Results  Component Value Date   BUN 11 04/05/2016   BUN 19 10/07/2015   CREATININE 0.91 04/05/2016   CREATININE 0.89 10/07/2015  On Losartan. - last set of lipids: Lab Results  Component Value Date   CHOL 204 (H) 04/05/2016   HDL 69 04/05/2016   LDLCALC 121 04/05/2016   TRIG 72 04/05/2016   CHOLHDL 3.0 04/05/2016   - last eye exam was in 07/2015. No DR. Next in 12/2016. - + numbness and tingling. + neuropathy. She has gastroparesis, for which she was  on Reglan.  She has a h/o TIA in 07/2014. She is on Plavix and ASA.  Patient also has a history of hypertension associated with hypokalemia. - dx during pregnancy in 2000. - she had a high aldosterone level and a low renin level in 2013-2014  Most recent potassium levels have been normal: Component     Latest Ref Rng & Units 10/07/2015 04/05/2016  Potassium     3.5 - 5.3 mmol/L 4.0 4.1   She has a family history of HTN, stroke in mother (70s) and brother (at 20 y/o).  She is off Atenolol, Cozaar, Amlodipine. Now only on Spironolactone 100 mg in am and 5 mg in pm >> she stopped the rest of the medicines by herself, as she was feeling much better after addition of spironolactone and her blood pressure was much better controlled.  She had an abdominal CT with and without contrast on 06/12/2011, and that was initially read as showing normal adrenals, however, it was addended showing: Addendum #1: Thin cut reconstructions have become available.  With these there is small nodule measuring approx 1.2 by 0.7 cm in  lateral  limb of left adrenal. This could represent either nonfunctioning or  hyperfunctioning adenoma.   She also tells me that that that time, she was found to have a high aldosterone in the low  renin. I  do not have these results. On ACTH and cortisol levels were normal in 2015: Component     Latest Ref Rng & Units 05/18/2014  Cortisol, Plasma     ug/dL 4.6  C206 ACTH     6 - 50 pg/mL 16   Of note, she had several abdominal CTs with contrast (last in 06/18/2014) and her adrenal glands appeared normal, without masses.   At last visit, we checked adrenal labs and they returned normal, however, she did not come back for a dexamethasone suppression test.  Component     Latest Ref Rng & Units 12/14/2015  Epinephrine     pg/mL 20  Norepinephrine     pg/mL 383  Dopamine      REPORT  Catecholamines, Total     pg/mL 403  Metanephrine, Pl     <=57 pg/mL <25  Normetanephrine,  Pl     <=148 pg/mL 82  Total Metanephrines-Plasma     <=205 pg/mL 82   At last visit, we discussed about further investigation for her hyperaldosteronism, but I could not check a PRA and aldosterone, since she was on spironolactone.   ROS: Constitutional: no weight gain/no weight loss, no fatigue, no subjective hyperthermia, no subjective hypothermia Eyes: no blurry vision, no xerophthalmia ENT: no sore throat, no nodules palpated in throat, no dysphagia, no odynophagia, no hoarseness Cardiovascular: no CP/no SOB/no palpitations/no leg swelling Respiratory: no cough/no SOB/no wheezing Gastrointestinal: no N/no V/no D/+ C/no acid reflux Musculoskeletal: no muscle aches/no joint aches Skin: no rashes, no hair loss Neurological: no tremors/no numbness/no tingling/no dizziness  I reviewed pt's medications, allergies, PMH, social hx, family hx, and changes were documented in the history of present illness. Otherwise, unchanged from my initial visit note.  Past Medical History:  Diagnosis Date  . Adrenal adenoma   . Allergy   . Anemia   . Diabetes mellitus without complication ([place])   . Endometriosis of rectovaginal septum   . GERD (gastroesophageal reflux disease)   . Headache    MIGRAINES  . Hyperaldosteronism ([place])    Possibly  . Hyperlipidemia   . Hypertension   . Hypokalemia   . Ovarian cyst    Past Surgical History:  Procedure Laterality Date  . CARDIAC CATHETERIZATION    . CESAREAN SECTION     X 2  . ROBOTIC ASSISTED TOTAL HYSTERECTOMY WITH BILATERAL SALPINGO OOPHERECTOMY Bilateral 06/16/2014   Procedure: ROBOTIC ASSISTED TOTAL HYSTERECTOMY WITH BILATERAL SALPINGO OOPHORECTOMY, LYSIS OF ADHESIONS;  Surgeon: Everitt Amber, MD;  Location: [facility];  Service: Gynecology;  Laterality: Bilateral;  . TUBAL LIGATION     Social History   Social History  . Marital Status: Married    Spouse Name: Richard  . Number of Children: 2  . Years of Education: 14   Occupational History   . Works from home at [employer]  . Smoking status: Never Smoker   . Smokeless tobacco: Not on file  . Alcohol Use: No  . Drug Use: No   Social History Narrative   Lives at home aw/ husband and youngest daughter   Right-handed   Drinks about 16-32 oz of caffeinated beverages per day   Current Outpatient Prescriptions on File Prior to Visit  Medication Sig Dispense Refill  . Blood Glucose Monitoring Suppl (ONE TOUCH ULTRA MINI) w/Device KIT See admin instructions. for testing  0  . metFORMIN (GLUCOPHAGE-XR) 500 MG 24 hr tablet Take 1 tablet (500 mg total)  by mouth daily with breakfast. 90 tablet 0  . ONE TOUCH ULTRA TEST test strip 1 each by Other route 2 (two) times daily. for testing 100 each 0  . spironolactone (ALDACTONE) 100 MG tablet Take 0.5 tablets (50 mg total) by mouth daily. 60 tablet 1  . amLODipine (NORVASC) 10 MG tablet Take 1 tablet (10 mg total) by mouth daily. (Patient not taking: Reported on 10/12/2016) 90 tablet 1  . aspirin 81 MG EC tablet Take 81 mg by mouth every morning.  0  . atenolol (TENORMIN) 50 MG tablet Take 1 tablet (50 mg total) by mouth daily. (Patient not taking: Reported on 10/12/2016) 30 tablet 11  . clopidogrel (PLAVIX) 75 MG tablet Take 75 mg by mouth every morning. Reported on 12/14/2015  0  . Cyanocobalamin (B-12 PO) Take 1 tablet by mouth daily. Reported on 12/14/2015    . dexamethasone (DECADRON) 1 MG tablet Take 1 tablet by mouth once at 11 pm, before coming for labs at 8 am the next morning (Patient not taking: Reported on 04/05/2016) 1 tablet 0  . Iron TABS Take 1 tablet by mouth daily. Reported on 09/08/2015    . losartan (COZAAR) 100 MG tablet Take 1 tablet (100 mg total) by mouth every morning. (Patient not taking: Reported on 10/12/2016) 90 tablet 0  . Magnesium 250 MG TABS Take by mouth.    . Vitamin D, Ergocalciferol, (DRISDOL) 50000 units CAPS capsule Take 50,000 Units by mouth every 7 (seven) days. Reported on  09/29/2015     No current facility-administered medications on file prior to visit.    Allergies  Allergen Reactions  . Ace Inhibitors Cough  . Hctz [Hydrochlorothiazide] Cough   Family History  Problem Relation Age of Onset  . Diabetes Father   . Hepatitis C Father   . Stroke Mother   . Diabetes Mother   . Stroke Brother    PE: BP 108/72 (BP Location: Left Arm, Patient Position: Sitting)   Pulse 82   Ht '5\' 8"'  (1.727 m)   Wt 189 lb (85.7 kg)   LMP 05/16/2014   SpO2 98%   BMI 28.74 kg/m  Wt Readings from Last 3 Encounters:  10/12/16 189 lb (85.7 kg)  04/05/16 189 lb (85.7 kg)  12/14/15 187 lb (84.8 kg)   Constitutional: overweight, in NAD Eyes: PERRLA, EOMI, no exophthalmos ENT: moist mucous membranes, no thyromegaly, no cervical lymphadenopathy Cardiovascular: RRR, No MRG Respiratory: CTA B Gastrointestinal: abdomen soft, NT, ND, BS+ Musculoskeletal: no deformities, strength intact in all 4 Skin: moist, warm, no rashes Neurological: no tremor with outstretched hands, DTR normal in all 4  ASSESSMENT: 1. DM2, non-insulin-dependent, uncontrolled, without complications ? Gastroparesis ? PN  2. HTN  PLAN:  1. Patient with long-standing, uncontrolled diabetes, Returning after a long absence. She was not compliant with visits, sugar checks, or medications. As a consequence, her HbA1c today's 10.4%. She only restarted to check her sugars 4 days ago and restarted metformin yesterday. She would like to continue with this and I advised her to increase the dose as tolerated. Since her sugars are improving, I will not add another medication for now, but I advised her to let me know if the sugars do not decrease to target within the next month. - I suggested to:  Patient Instructions  Please increase Metformin ER to 1000 mg 2x a day.  Check sugars 1-2x a day and bring logs at next visit.  Please come back in July-August.  - continue  checking sugars at different times of the  day - check 1-2x a day, rotating checks - advised for yearly eye exams >> advised to schedule - Return to clinic in 2-3 mo with sugar log   2. HTN - I again reviewed her blood pressures, potassium levels, an imaging study reports - She has a long history of hypertension and also family history of the disease  - she has a history of hypokalemia, which has resolved - At last visit, we discussed about further investigation for her hyperaldosteronism, but I could not check a PRA and aldosterone, since she was on spironolactone. We discussed about the possibility of taking her off the spironolactone for further testing, however, I explained that this is only necessary if she agrees to undergo surgery in case the hyperaldosteronism lateralizes to one of the adrenals. Since she was feeling better on spironolactone and her blood pressure was improving, she was not interested in pursuing further investigation for her hyperaldosteronism.  - Since last visit, she was able to eliminate all her other BP meds and only remain on spironolactone, with good blood pressure control. Per review of chart, her potassium levels have also been normal. Will continue only on spironolactone 100 mg in a.m. and 50 mg in p.m. for now. She again confirms that she is not interested in pursuing further investigation for her hyperaldosteronism.  Philemon Kingdom, MD PhD [facility]

## 2016-10-12 NOTE — Patient Instructions (Signed)
Please increase Metformin ER to 1000 mg 2x a day.  Check sugars 1-2x a day and bring logs at next visit.  Please come back in July-August.

## 2016-10-25 ENCOUNTER — Ambulatory Visit
Admission: RE | Admit: 2016-10-25 | Discharge: 2016-10-25 | Disposition: A | Discharge status: Home / Self Care | Payer: 59 | Source: Ambulatory Visit | Attending: Physician Assistant | Admitting: Physician Assistant

## 2016-10-25 ENCOUNTER — Ambulatory Visit (INDEPENDENT_AMBULATORY_CARE_PROVIDER_SITE_OTHER): Payer: 59 | Admitting: Physician Assistant

## 2016-10-25 ENCOUNTER — Encounter: Payer: Self-pay | Admitting: Physician Assistant

## 2016-10-25 ENCOUNTER — Other Ambulatory Visit: Payer: Self-pay | Admitting: Physician Assistant

## 2016-10-25 VITALS — BP 120/80 | HR 77 | Temp 97.3°F | Resp 16 | Ht 68.0 in | Wt 189.0 lb

## 2016-10-25 DIAGNOSIS — K602 Anal fissure, unspecified: Secondary | ICD-10-CM

## 2016-10-25 DIAGNOSIS — R109 Unspecified abdominal pain: Secondary | ICD-10-CM

## 2016-10-25 DIAGNOSIS — E785 Hyperlipidemia, unspecified: Secondary | ICD-10-CM | POA: Diagnosis not present

## 2016-10-25 DIAGNOSIS — E559 Vitamin D deficiency, unspecified: Secondary | ICD-10-CM

## 2016-10-25 DIAGNOSIS — I10 Essential (primary) hypertension: Secondary | ICD-10-CM

## 2016-10-25 DIAGNOSIS — K5909 Other constipation: Secondary | ICD-10-CM | POA: Diagnosis not present

## 2016-10-25 DIAGNOSIS — Z1231 Encounter for screening mammogram for malignant neoplasm of breast: Secondary | ICD-10-CM

## 2016-10-25 DIAGNOSIS — E876 Hypokalemia: Secondary | ICD-10-CM | POA: Diagnosis not present

## 2016-10-25 DIAGNOSIS — E119 Type 2 diabetes mellitus without complications: Secondary | ICD-10-CM

## 2016-10-25 LAB — HEPATIC FUNCTION PANEL
ALT: 13 U/L (ref 6–29)
AST: 14 U/L (ref 10–35)
Albumin: 4.3 g/dL (ref 3.6–5.1)
Alkaline Phosphatase: 76 U/L (ref 33–130)
BILIRUBIN DIRECT: 0.1 mg/dL (ref <=0.2)
BILIRUBIN INDIRECT: 0.4 mg/dL (ref 0.2–1.2)
BILIRUBIN TOTAL: 0.5 mg/dL (ref 0.2–1.2)
TOTAL PROTEIN: 7.1 g/dL (ref 6.1–8.1)

## 2016-10-25 LAB — BASIC METABOLIC PANEL WITH GFR
BUN: 12 mg/dL (ref 7–25)
CALCIUM: 10 mg/dL (ref 8.6–10.4)
CO2: 23 mmol/L (ref 20–31)
Chloride: 104 mmol/L (ref 98–110)
Creat: 0.88 mg/dL (ref 0.50–1.05)
GFR, EST NON AFRICAN AMERICAN: 76 mL/min (ref >=60)
GFR, Est African American: 88 mL/min (ref >=60)
Glucose, Bld: 216 mg/dL — ABNORMAL HIGH (ref 65–99)
POTASSIUM: 3.9 mmol/L (ref 3.5–5.3)
SODIUM: 140 mmol/L (ref 135–146)

## 2016-10-25 MED ORDER — DILTIAZEM GEL 2 %: 1.0000 "application " | Freq: Three times a day (TID) | CUTANEOUS | 3 refills | Status: DC

## 2016-10-25 MED ORDER — METOCLOPRAMIDE HCL 5 MG PO TABS: 5.0000 mg | ORAL_TABLET | Freq: Three times a day (TID) | ORAL | 1 refills | Status: DC

## 2016-10-25 NOTE — Patient Instructions (Addendum)
[facility]  7 a.m.-6:30 p.m., Monday 7 a.m.-5 p.m., Tuesday-Friday Schedule an appointment by calling [phone].  Encourage you to get the 3D Mammogram  The 3D Mammogram is much more specific and sensitive to pick up breast cancer. For women with fibrocystic breast or lumpy breast it can be hard to determine if it is cancer or not but the 3D mammogram is able to tell this difference which cuts back on unneeded additional tests or scary call backs.   - over 40% increase in detection of breast cancer - over 40% reduction in false positives.  - fewer call backs - reduced anxiety - improved outcomes - [facility]   Gastroparesis Gastroparesis, also called delayed gastric emptying, is a condition in which food takes longer than normal to empty from the stomach. The condition is usually long-lasting (chronic). What are the causes? This condition may be caused by:  An endocrine disorder, such as hypothyroidism or diabetes. Diabetes is the most common cause of this condition.  A nervous system disease, such as Parkinson disease or multiple sclerosis.  Cancer, infection, or surgery of the stomach or vagus nerve.  A connective tissue disorder, such as scleroderma.  Certain medicines. In most cases, the cause is not known. What increases the risk? This condition is more likely to develop in:  People with certain disorders, including endocrine disorders, eating disorders, amyloidosis, and scleroderma.  People with certain diseases, including Parkinson disease or multiple sclerosis.  People with cancer or infection of the stomach or vagus nerve.  People who have had surgery on the stomach or vagus nerve.  People who take certain medicines.  Women. What are the signs or symptoms? Symptoms of this condition include:  An early feeling of fullness when eating.  Nausea.  Weight loss.  Vomiting.  Heartburn.  Abdominal  bloating.  Inconsistent blood glucose levels.  Lack of appetite.  Acid from the stomach coming up into the esophagus (gastroesophageal reflux).  Spasms of the stomach. Symptoms may come and go. How is this diagnosed? This condition is diagnosed with tests, such as:  Tests that check how long it takes food to move through the stomach and intestines. These tests include:  Upper gastrointestinal (GI) series. In this test, X-rays of the intestines are taken after you drink a liquid. The liquid makes the intestines show up better on the X-rays.  Gastric emptying scintigraphy. In this test, scans are taken after you eat food that contains a small amount of radioactive material.  Wireless capsule GI monitoring system. This test involves swallowing a capsule that records information about movement through the stomach.  Gastric manometry. This test measures electrical and muscular activity in the stomach. It is done with a thin tube that is passed down the throat and into the stomach.  Endoscopy. This test checks for abnormalities in the lining of the stomach. It is done with a long, thin tube that is passed down the throat and into the stomach.  An ultrasound. This test can help rule out gallbladder disease or pancreatitis as a cause of your symptoms. It uses sound waves to take pictures of the inside of your body. How is this treated? There is no cure for gastroparesis. This condition may be managed with:  Treatment of the underlying condition causing the gastroparesis.  Lifestyle changes, including exercise and dietary changes. Dietary changes can include:  Changes in what and when you eat.  Eating smaller meals more often.  Eating low-fat  foods.  Eating low-fiber forms of high-fiber foods, such as cooked vegetables instead of raw vegetables.  Having liquid foods in place of solid foods. Liquid foods are easier to digest.  Medicines. These may be given to control nausea and  vomiting and to stimulate stomach muscles.  Getting food through a feeding tube. This may be done in severe cases.  A gastric neurostimulator. This is a device that is inserted into the body with surgery. It helps improve stomach emptying and control nausea and vomiting. Follow these instructions at home:  Follow your health care provider's instructions about exercise and diet.  Take medicines only as directed by your health care provider. Contact a health care provider if:  Your symptoms do not improve with treatment.  You have new symptoms. Get help right away if:  You have severe abdominal pain that does not improve with treatment.  You have nausea that does not go away.  You cannot keep fluids down. This information is not intended to replace advice given to you by your health care provider. Make sure you discuss any questions you have with your health care provider. Document Released: 05/22/2005 Document Revised: 10/28/2015 Document Reviewed: 05/18/2014 Elsevier Interactive Patient Education  2017 [place] Fissure, Adult An anal fissure is a small tear or crack in the skin around the anus. Bleeding from a fissure usually stops on its own within a few minutes. However, bleeding will often occur again with each bowel movement until the crack heals. CAUSES This condition may be caused by:  Passing large, hard stool (feces).  Frequent diarrhea.  Constipation.  Inflammatory bowel disease (Crohn disease or ulcerative colitis).  Infections.  Anal sex. SYMPTOMS Symptoms of this condition include:  Bleeding from the rectum.  Small amounts of blood seen on your stool, on toilet paper, or in the toilet after a bowel movement.  Painful bowel movements.  Itching or irritation around the anus. DIAGNOSIS A health care provider may diagnose this condition by closely examining the anal area. An anal fissure can usually be seen with careful inspection. In some  cases, a rectal exam may be performed, or a short tube (anoscope) may be used to examine the anal canal. TREATMENT Treatment for this condition may include:  Taking steps to avoid constipation. This may include making changes to your diet, such as increasing your intake of fiber or fluid.  Taking fiber supplements. These supplements can soften your stool to help make bowel movements easier. Your health care provider may also prescribe a stool softener if your stool is often hard.  Taking sitz baths. This may help to heal the tear.  Using medicated creams or ointments. These may be prescribed to lessen discomfort. HOME CARE INSTRUCTIONS Eating and Drinking  Avoid foods that may be constipating, such as bananas and dairy products.  Drink enough fluid to keep your urine clear or pale yellow.  Maintain a diet that is high in fruits, whole grains, and vegetables. General Instructions  Keep the anal area as clean and dry as possible.  Take sitz baths as told by your health care provider. Do not use soap in the sitz baths.  Take over-the-counter and prescription medicines only as told by your health care provider.  Use creams or ointments only as told by your health care provider.  Keep all follow-up visits as told by your health care provider. This is important. SEEK MEDICAL CARE IF:  You have more bleeding.  You have a fever.  You have diarrhea that is mixed with blood.  You continue to have pain.  Your problem is getting worse rather than better.   This information is not intended to replace advice given to you by your health care provider. Make sure you discuss any questions you have with your health care provider.   Document Released: 05/22/2005 Document Revised: 02/10/2015 Document Reviewed: 08/17/2014 Elsevier Interactive Patient Education 2016 Elsevier Inc.     Metoclopramide tablets What is this medicine? METOCLOPRAMIDE (met oh kloe PRA mide) is used to treat  the symptoms of gastroesophageal reflux disease (GERD) like heartburn. It is also used to treat people with slow emptying of the stomach and intestinal tract. This medicine may be used for other purposes; ask your health care provider or pharmacist if you have questions. COMMON BRAND NAME(S): Reglan What should I tell my health care provider before I take this medicine? They need to know if you have any of these conditions: -breast cancer -depression -diabetes -heart failure -high blood pressure -kidney disease -liver disease -Parkinson's disease or a movement disorder -pheochromocytoma -seizures -stomach obstruction, bleeding, or perforation -an unusual or allergic reaction to metoclopramide, procainamide, sulfites, other medicines, foods, dyes, or preservatives -pregnant or trying to get pregnant -breast-feeding How should I use this medicine? Take this medicine by mouth with a glass of water. Follow the directions on the prescription label. Take this medicine on an empty stomach, about 30 minutes before eating. Take your doses at regular intervals. Do not take your medicine more often than directed. Do not stop taking except on the advice of your doctor or health care professional. A special MedGuide will be given to you by the pharmacist with each prescription and refill. Be sure to read this information carefully each time. Talk to your pediatrician regarding the use of this medicine in children. Special care may be needed. Overdosage: If you think you have taken too much of this medicine contact a poison control center or emergency room at once. NOTE: This medicine is only for you. Do not share this medicine with others. What if I miss a dose? If you miss a dose, take it as soon as you can. If it is almost time for your next dose, take only that dose. Do not take double or extra doses. What may interact with this medicine? -acetaminophen -cyclosporine -digoxin -medicines for  blood pressure -medicines for diabetes, including insulin -medicines for hay fever and other allergies -medicines for depression, especially a Monoamine Oxidase Inhibitor (MAOI) -medicines for Parkinson's disease, like levodopa -medicines for sleep or for pain -quinidine -tetracycline This list may not describe all possible interactions. Give your health care provider a list of all the medicines, herbs, non-prescription drugs, or dietary supplements you use. Also tell them if you smoke, drink alcohol, or use illegal drugs. Some items may interact with your medicine. What should I watch for while using this medicine? It may take a few weeks for your stomach condition to start to get better. However, do not take this medicine for longer than 12 weeks. The longer you take this medicine, and the more you take it, the greater your chances are of developing serious side effects. If you are an elderly patient, a female patient, or you have diabetes, you may be at an increased risk for side effects from this medicine. Contact your doctor immediately if you start having movements you cannot control such as lip smacking, rapid movements of the tongue, involuntary or uncontrollable movements of the eyes,  head, arms and legs, or muscle twitches and spasms. Patients and their families should watch out for worsening depression or thoughts of suicide. Also watch out for any sudden or severe changes in feelings such as feeling anxious, agitated, panicky, irritable, hostile, aggressive, impulsive, severely restless, overly excited and hyperactive, or not being able to sleep. If this happens, especially at the beginning of treatment or after a change in dose, call your doctor. Do not treat yourself for high fever. Ask your doctor or health care professional for advice. You may get drowsy or dizzy. Do not drive, use machinery, or do anything that needs mental alertness until you know how this drug affects you. Do not  stand or sit up quickly, especially if you are an older patient. This reduces the risk of dizzy or fainting spells. Alcohol can make you more drowsy and dizzy. Avoid alcoholic drinks. What side effects may I notice from receiving this medicine? Side effects that you should report to your doctor or health care professional as soon as possible: -allergic reactions like skin rash, itching or hives, swelling of the face, lips, or tongue -abnormal production of milk in females -breast enlargement in both males and females -change in the way you walk -difficulty moving, speaking or swallowing -drooling, lip smacking, or rapid movements of the tongue -excessive sweating -fever -involuntary or uncontrollable movements of the eyes, head, arms and legs -irregular heartbeat or palpitations -muscle twitches and spasms -unusually weak or tired Side effects that usually do not require medical attention (report to your doctor or health care professional if they continue or are bothersome): -change in sex drive or performance -depressed mood -diarrhea -difficulty sleeping -headache -menstrual changes -restless or nervous This list may not describe all possible side effects. Call your doctor for medical advice about side effects. You may report side effects to FDA at 1-800-FDA-1088. Where should I keep my medicine? Keep out of the reach of children. Store at room temperature between 20 and 25 degrees C (68 and 77 degrees F). Protect from light. Keep container tightly closed. Throw away any unused medicine after the expiration date. NOTE: This sheet is a summary. It may not cover all possible information. If you have questions about this medicine, talk to your doctor, pharmacist, or health care provider.  2018 Elsevier/Gold Standard (2016-03-08 15:13:45)

## 2016-10-25 NOTE — Progress Notes (Signed)
Assessment and Plan:   Diabetes mellitus without complication ([place]) -     ? Gastroparesis, get better control of sugar, follow up Dr. Darnell Level August -     metoCLOPramide (REGLAN) 5 MG tablet; Take 1 tablet (5 mg total) by mouth 3 (three) times daily.  Essential hypertension - continue medications, DASH diet, exercise and monitor at home. Call if greater than 130/80.  -     CBC with Differential/Platelet -     BASIC METABOLIC PANEL WITH GFR -     Hepatic function panel  Vitamin D deficiency  Abdominal pain, unspecified abdominal location with constipation - ? Gastroparesis, IBSD, will refer to GI, due for colonoscopy - if reglan does not help then will try samples of linzess -     Ambulatory referral to Gastroenterology -     US Abdomen Complete; Future -     Amylase -     Celiac Disease Comprehensive Panel with Reflexes -     metoCLOPramide (REGLAN) 5 MG tablet; Take 1 tablet (5 mg total) by mouth 3 (three) times daily.  Anal fissure - need colonoscopy, use small amount up to 2-3 x a daily -     diltiazem 2 % GEL; Apply 1 application topically 3 (three) times daily.   Continue diet and meds as discussed. Further disposition pending results of labs. Discussed med's effects and SE's.   Over 30 minutes of exam, counseling, chart review, and critical decision making was performed Future Appointments Date Time Provider [place]  01/23/2017 10:00 AM Philemon Kingdom, MD [id number] None     HPI 51 y.o. female  presents for 3 month follow up on hypertension, cholesterol, diabetes and vitamin D deficiency.   She has been having bilateral shoulder pain and bilateral leg pain getting worse over 7 month, and new left foot numbness x 2-4 weeks.   Her blood pressure has been controlled at home, today their BP is BP: 120/80  She does not workout. She denies chest pain, shortness of breath, dizziness.  She is not on cholesterol medication and denies myalgias. Her cholesterol is at  goal. The cholesterol last visit was:   Lab Results  Component Value Date   CHOL 204 (H) 04/05/2016   HDL 69 04/05/2016   LDLCALC 121 04/05/2016   TRIG 72 04/05/2016   CHOLHDL 3.0 04/05/2016   She has been working on diet and exercise for Diabetes without complications,she has not checked her sugar at home, she states that she can not tolerate MF, following with Dr. Darnell Level,  she is on bASA, she is on ACE/ARB, and denies paresthesia of the feet, polydipsia, polyuria and visual disturbances. Last A1C was:  Lab Results  Component Value Date   HGBA1C 10.4 10/12/2016   Lab Results  Component Value Date   GFRAA 85 04/05/2016   Patient is on Vitamin D supplement.   Lab Results  Component Value Date   VD25OH 38 04/05/2016     BMI is Body mass index is 28.74 kg/m., she is working on diet and exercise. Wt Readings from Last 3 Encounters:  10/25/16 189 lb (85.7 kg)  10/12/16 189 lb (85.7 kg)  04/05/16 189 lb (85.7 kg)   She has history of constipation, but has been having on and off diarrhea/constipation, states she is not having urgency to have BM, had BM yesterday but has to strain but normal pop, has burning sensation in rectal area with bowel movements afterwards, has severe pain right above and below her navel,  and right flank pain, last intermittent x 2 weeks with nausea worse with food, has improved since that time. . No black stool, blood in stool. Decreased appetite, feels full quickly.  Has never had colonoscopy  Current Medications:  Current Outpatient Prescriptions on File Prior to Visit  Medication Sig Dispense Refill  . aspirin 81 MG EC tablet Take 81 mg by mouth every morning.  0  . Blood Glucose Monitoring Suppl (ONE TOUCH ULTRA MINI) w/Device KIT See admin instructions. for testing  0  . clopidogrel (PLAVIX) 75 MG tablet Take 75 mg by mouth every morning. Reported on 12/14/2015  0  . Cyanocobalamin (B-12 PO) Take 1 tablet by mouth daily. Reported on 12/14/2015    . Iron TABS  Take 1 tablet by mouth daily. Reported on 09/08/2015    . Magnesium 250 MG TABS Take by mouth.    . metFORMIN (GLUCOPHAGE-XR) 500 MG 24 hr tablet Take 1 tablet (500 mg total) by mouth daily with breakfast. 90 tablet 0  . ONE TOUCH ULTRA TEST test strip 1 each by Other route 2 (two) times daily. for testing 100 each 0  . spironolactone (ALDACTONE) 50 MG tablet Take 100 mg in am and 50 mg in pm 270 tablet 3  . Vitamin D, Ergocalciferol, (DRISDOL) 50000 units CAPS capsule Take 50,000 Units by mouth every 7 (seven) days. Reported on 09/29/2015     No current facility-administered medications on file prior to visit.    Medical History:  Past Medical History:  Diagnosis Date  . Adrenal adenoma   . Allergy   . Anemia   . Diabetes mellitus without complication ([place])   . Endometriosis of rectovaginal septum   . GERD (gastroesophageal reflux disease)   . Headache    MIGRAINES  . Hyperaldosteronism ([place])    Possibly  . Hyperlipidemia   . Hypertension   . Hypokalemia   . Ovarian cyst    Allergies:  Allergies  Allergen Reactions  . Ace Inhibitors Cough  . Hctz [Hydrochlorothiazide] Cough     Review of Systems:  Review of Systems  Constitutional: Negative.   Respiratory: Negative.   Cardiovascular: Negative.   Gastrointestinal: Positive for abdominal pain, constipation and nausea.  Genitourinary: Negative.   Musculoskeletal: Positive for back pain. Negative for falls, joint pain, myalgias and neck pain.  Skin: Negative.   Neurological: Negative for dizziness, tingling, tremors, sensory change, speech change, focal weakness, seizures, loss of consciousness and headaches.  Psychiatric/Behavioral: Negative.     Family history- Review and unchanged Social history- Review and unchanged Physical Exam: BP 120/80   Pulse 77   Temp 97.3 F (36.3 C)   Resp 16   Ht '5\' 8"'  (1.727 m)   Wt 189 lb (85.7 kg)   LMP 05/16/2014   SpO2 99%   BMI 28.74 kg/m  Wt Readings from Last 3 Encounters:   10/25/16 189 lb (85.7 kg)  10/12/16 189 lb (85.7 kg)  04/05/16 189 lb (85.7 kg)   General Appearance: Well nourished, in no apparent distress. Eyes: PERRLA, EOMs, conjunctiva no swelling or erythema Sinuses: No Frontal/maxillary tenderness ENT/Mouth: Ext aud canals clear, TMs without erythema, bulging. No erythema, swelling, or exudate on post pharynx.  Tonsils not swollen or erythematous. Hearing normal.  Neck: Supple, thyroid normal.  Respiratory: Respiratory effort normal, BS equal bilaterally without rales, rhonchi, wheezing or stridor.  Cardio: RRR with no MRGs. Brisk peripheral pulses without edema.  Abdomen: Soft, + BS. + tenderness epigastric, no guarding, rebound, hernias, masses.  Lymphatics: Non tender without lymphadenopathy.  Musculoskeletal: Full ROM, 5/5 strength, Normal gait, + neck pain with full ROM Skin: Warm, dry without rashes, lesions, ecchymosis.  Neuro: Cranial nerves intact. No cerebellar symptoms.  Psych: Awake and oriented X 3, normal affect, Insight and Judgment appropriate.    Vicie Mutters, PA-C 12:06 PM [facility]

## 2016-10-26 ENCOUNTER — Encounter: Payer: Self-pay | Admitting: Gastroenterology

## 2016-10-26 LAB — CBC WITH DIFFERENTIAL/PLATELET
BASOS ABS: 0 {cells}/uL (ref 0–200)
BASOS PCT: 0 %
Eosinophils Absolute: 0 cells/uL — ABNORMAL LOW (ref 15–500)
Eosinophils Relative: 0 %
HCT: 41.5 % (ref 35.0–45.0)
Hemoglobin: 13.2 g/dL (ref 11.7–15.5)
Lymphocytes Relative: 25 %
Lymphs Abs: 1475 cells/uL (ref 850–3900)
MCH: 27.3 pg (ref 27.0–33.0)
MCHC: 31.8 g/dL — ABNORMAL LOW (ref 32.0–36.0)
MCV: 85.9 fL (ref 80.0–100.0)
MONOS PCT: 6 %
MPV: 10.9 fL (ref 7.5–12.5)
Monocytes Absolute: 354 cells/uL (ref 200–950)
NEUTROS PCT: 69 %
Neutro Abs: 4071 cells/uL (ref 1500–7800)
PLATELETS: 196 10*3/uL (ref 140–400)
RBC: 4.83 MIL/uL (ref 3.80–5.10)
RDW: 13.6 % (ref 11.0–15.0)
WBC: 5.9 10*3/uL (ref 3.8–10.8)

## 2016-10-26 LAB — AMYLASE: Amylase: 62 U/L (ref 21–101)

## 2016-10-26 LAB — CELIAC DISEASE COMPREHENSIVE PANEL WITH REFLEXES
IGA: 137 mg/dL (ref 81–463)
TISSUE TRANSGLUTAMINASE AB, IGA: 1 U/mL (ref <4)

## 2016-10-31 ENCOUNTER — Ambulatory Visit: Payer: Self-pay | Admitting: Physician Assistant

## 2016-11-06 ENCOUNTER — Ambulatory Visit
Admission: RE | Admit: 2016-11-06 | Discharge: 2016-11-06 | Disposition: A | Discharge status: Home / Self Care | Payer: 59 | Source: Ambulatory Visit | Attending: Physician Assistant | Admitting: Physician Assistant

## 2016-11-06 DIAGNOSIS — R109 Unspecified abdominal pain: Secondary | ICD-10-CM

## 2016-11-28 ENCOUNTER — Encounter: Payer: Self-pay | Admitting: Gastroenterology

## 2016-11-28 ENCOUNTER — Ambulatory Visit (INDEPENDENT_AMBULATORY_CARE_PROVIDER_SITE_OTHER): Payer: 59 | Admitting: Gastroenterology

## 2016-11-28 VITALS — BP 118/80 | HR 76 | Ht 67.0 in | Wt 183.1 lb

## 2016-11-28 DIAGNOSIS — R1030 Lower abdominal pain, unspecified: Secondary | ICD-10-CM

## 2016-11-28 DIAGNOSIS — R1013 Epigastric pain: Secondary | ICD-10-CM | POA: Diagnosis not present

## 2016-11-28 DIAGNOSIS — G8929 Other chronic pain: Secondary | ICD-10-CM | POA: Diagnosis not present

## 2016-11-28 DIAGNOSIS — K5900 Constipation, unspecified: Secondary | ICD-10-CM

## 2016-11-28 DIAGNOSIS — Z800 Family history of malignant neoplasm of digestive organs: Secondary | ICD-10-CM | POA: Diagnosis not present

## 2016-11-28 MED ORDER — OMEPRAZOLE 40 MG PO CPDR: 40.0000 mg | DELAYED_RELEASE_CAPSULE | Freq: Every day | ORAL | 3 refills | Status: DC

## 2016-11-28 MED ORDER — NA SULFATE-K SULFATE-MG SULF 17.5-3.13-1.6 GM/177ML PO SOLN: 1.0000 | Freq: Once | ORAL | 0 refills | Status: AC

## 2016-11-28 NOTE — Patient Instructions (Signed)
If you are age 52 or older, your body mass index should be between 23-30. Your Body mass index is 28.68 kg/m. If this is out of the aforementioned range listed, please consider follow up with your Primary Care Provider.  If you are age 62 or younger, your body mass index should be between 19-25. Your Body mass index is 28.68 kg/m. If this is out of the aformentioned range listed, please consider follow up with your Primary Care Provider.   You have been scheduled for a colonoscopy. Please follow written instructions given to you at your visit today.  Please pick up your prep supplies at the pharmacy within the next 1-3 days. If you use inhalers (even only as needed), please bring them with you on the day of your procedure. Your physician has requested that you go to www.[email or web address].com and enter the access code given to you at your visit today. This web site gives a general overview about your procedure. However, you should still follow specific instructions given to you by our office regarding your preparation for the procedure.  We have sent the following medications to your pharmacy for you to pick up at your convenience:  Suprep  Omeprazole  Please use over the counter Miralax twice daily as instructed.  Thank you.

## 2016-11-28 NOTE — Progress Notes (Signed)
HPI :  51 y/o female with a history of DM, GERD, HTN, HLD, referred here by Vicie Mutters, PA for a new patient visit for abdominal pains and colon cancer screening.   She reports some pain in her upper and lower abdomen, as well as low back pain and constipation.   She reports symptoms ongoing since January. She feels upper abdominal discomfort in the epigastric area. She reports the pain comes and goes, almost always precipitated by eating. Hard to say if particular foods make it worse or not. She thinks within an hour of eating she will feel it. She has felt it in the RUQ and R flank and R back as well. Some nausea, no vomiting. Pain would abate after a few hours and then comes back when she eats again . She thinks weight has fluctuated, she thinks lost about 5-7 lbs or so. She does have some heartburn which bothers her at times. She takes TUMS which has helped to abate her symptoms. US done showing no evidence of gallstones   She also feels discomfort in the lower abdomen, in lower back and flank. She is not sure if related to eating, feels different from her upper symptoms and not related. She has felt some constipation during this time, and lack of urgency to have a bowel movement. She felt bloating with this and could not completely evacuate herself with bowel movements. Having a bowel movement reliably relieves her abdominal pains. No blood in the stools. No prior colonoscopy.   Father had colon cancer, and hepatitis C. Father had colon cancer diagnosed age 64 or so.   She thinks her symptoms have improved in recent weeks and feeling better in this regard. Having more regular bowel movements which has helped her, she thinks due to eating more healthy foods.   Prior workup: [place] on 11/06/16 - no gallstones CT abdomen 06/17/2014 - increased stool burden, no focal pathology  Past Medical History:  Diagnosis Date  . Adrenal adenoma   . Allergy   . Anemia   . Diabetes mellitus without  complication ([place])   . Endometriosis of rectovaginal septum   . GERD (gastroesophageal reflux disease)   . Headache    MIGRAINES  . Hyperaldosteronism ([place])    Possibly  . Hyperlipidemia   . Hypertension   . Hypokalemia   . Ovarian cyst      Past Surgical History:  Procedure Laterality Date  . CARDIAC CATHETERIZATION    . CESAREAN SECTION     X 2  . ROBOTIC ASSISTED TOTAL HYSTERECTOMY WITH BILATERAL SALPINGO OOPHERECTOMY Bilateral 06/16/2014   Procedure: ROBOTIC ASSISTED TOTAL HYSTERECTOMY WITH BILATERAL SALPINGO OOPHORECTOMY, LYSIS OF ADHESIONS;  Surgeon: Everitt Amber, MD;  Location: [facility];  Service: Gynecology;  Laterality: Bilateral;  . TUBAL LIGATION     Family History  Problem Relation Age of Onset  . Stroke Mother   . Diabetes Mother   . Diabetes Father   . Hepatitis C Father   . Colon cancer Father   . Pancreatic cancer Father   . Stroke Brother   . Scleroderma Maternal Grandmother   . Stroke Maternal Grandfather   . Breast cancer Neg Hx    Social History  Substance Use Topics  . Smoking status: Never Smoker  . Smokeless tobacco: Never Used  . Alcohol use Yes     Comment: occasional   Current Outpatient Prescriptions  Medication Sig Dispense Refill  . aspirin 81 MG EC tablet Take 81 mg by mouth  every morning.  0  . Blood Glucose Monitoring Suppl (ONE TOUCH ULTRA MINI) w/Device KIT See admin instructions. for testing  0  . Cyanocobalamin (B-12 PO) Take 1 tablet by mouth daily. Reported on 12/14/2015    . Iron TABS Take 1 tablet by mouth daily. Reported on 09/08/2015    . Magnesium 250 MG TABS Take by mouth.    . ONE TOUCH ULTRA TEST test strip 1 each by Other route 2 (two) times daily. for testing 100 each 0  . spironolactone (ALDACTONE) 50 MG tablet Take 100 mg in am and 50 mg in pm 270 tablet 3  . Vitamin D, Ergocalciferol, (DRISDOL) 50000 units CAPS capsule Take 50,000 Units by mouth every 7 (seven) days. Reported on 09/29/2015     No current  facility-administered medications for this visit.    Allergies  Allergen Reactions  . Ace Inhibitors Cough  . Hctz [Hydrochlorothiazide] Cough     Review of Systems: All systems reviewed and negative except where noted in HPI.    US Abdomen Complete  Result Date: 11/06/2016 CLINICAL DATA:  Right upper quadrant pain EXAM: ABDOMEN ULTRASOUND COMPLETE COMPARISON:  None. FINDINGS: Gallbladder: No gallstones or wall thickening visualized. No sonographic Murphy sign noted by sonographer. Common bile duct: Diameter: 3.4 mm Liver: No focal lesion identified. Within normal limits in parenchymal echogenicity. IVC: No abnormality visualized. Pancreas: Visualized portion unremarkable. Spleen: Size and appearance within normal limits. Right Kidney: Length: 10.7 cm. Echogenicity within normal limits. No mass or hydronephrosis visualized. Left Kidney: Length: 9.8 cm. Echogenicity within normal limits. No mass or hydronephrosis visualized. Abdominal aorta: No aneurysm visualized. Other findings: None. IMPRESSION: 1. Normal abdominal sonogram. Electronically Signed   By: Kerby Moors M.D.   On: 11/06/2016 09:19   Lab Results  Component Value Date   WBC 5.9 10/25/2016   HGB 13.2 10/25/2016   HCT 41.5 10/25/2016   MCV 85.9 10/25/2016   PLT 196 10/25/2016    Lab Results  Component Value Date   CREATININE 0.88 10/25/2016   BUN 12 10/25/2016   NA 140 10/25/2016   K 3.9 10/25/2016   CL 104 10/25/2016   CO2 23 10/25/2016    Lab Results  Component Value Date   ALT 13 10/25/2016   AST 14 10/25/2016   ALKPHOS 76 10/25/2016   BILITOT 0.5 10/25/2016      Physical Exam: BP 118/80 (BP Location: Left Arm, Patient Position: Sitting, Cuff Size: Normal)   Pulse 76   Ht _0  (1.702 m) Comment: height measured without shoes  Wt 183 lb 2 oz (83.1 kg)   LMP 05/16/2014   BMI 28.68 kg/m  Constitutional: Pleasant,well-developed, female in no acute distress. HEENT: Normocephalic and atraumatic.  Conjunctivae are normal. No scleral icterus. Neck supple.  Cardiovascular: Normal rate, regular rhythm.  Pulmonary/chest: Effort normal and breath sounds normal. No wheezing, rales or rhonchi. Abdominal: Soft, nondistended, nontender. There are no masses palpable. No hepatomegaly. Extremities: no edema Lymphadenopathy: No cervical adenopathy noted. Neurological: Alert and oriented to person place and time. Skin: Skin is warm and dry. No rashes noted. Psychiatric: Normal mood and affect. Behavior is normal.   ASSESSMENT AND PLAN: 51 year old female seen in consultation for the following issues:  Epigastric abdominal pain - history as outlined above, given improvement with Tums this could be distal esophagitis, versus H. pylori versus PUD. Biliary colic seems less likely with no gallstones noted in the gallbladder. At this time I'm recommending an upper endoscopy to further evaluate her  symptoms which have persisted. I discussed risks and benefits of upper endoscopy with her and she wanted to proceed. In the interim I'm recommending a trial of omeprazole 40 mg once daily to be taken half an hour prior to meals. Further recommendations pending her course and endoscopy results. If EGD is negative and symptoms do not improve on omeprazole may consider a HIDA scan.  Constipation / lower abdominal pain - suspect constipation is the cause of her lower abdominal pain with history as outlined above. Recommend MiraLAX to take twice daily and titrate up as needed to see if this helps initially. Otherwise given she's had no prior colonoscopy I recommended optical colonoscopy. Following discussion risks and benefits she wanted to proceed. Further respirations pending her course.  Family history of colon cancer / colon cancer screening - proceeding with optical colonoscopy as outlined.  Montello Cellar, MD [facility] Pager [phone]  CC: Vicie Mutters, PA-C

## 2017-01-18 ENCOUNTER — Encounter: Payer: 59 | Admitting: Gastroenterology

## 2017-01-23 ENCOUNTER — Ambulatory Visit: Payer: 59 | Admitting: Internal Medicine

## 2017-01-23 NOTE — Progress Notes (Deleted)
Patient ID: Kristin Hess, female   DOB: 04-Jan-1966, 51 y.o.   MRN: [id number]  HPI: Kristin Hess is a 51 y.o.-year-old female, returning for f/u for DM2, dx in 2012, prev. GDM in 2000, non-insulin-dependent, uncontrolled, with complications (?gastroparesis, poss. PN) and hypertension associated with hypokalemia. Last visit 3 mo ago.  DM2: Last hemoglobin A1c was: Lab Results  Component Value Date   HGBA1C 10.4 10/12/2016   HGBA1C 10.0 (H) 04/05/2016   HGBA1C 8.9 12/14/2015  08/2015: HbA1c 12.5%  Pt is on: - Metformin ER 500 mg in am >> 1000 mg 2x a day. Also takes vinegar, water with lemon, Cordamom tea. She was started on Glipizide 5 mg 2x a day before meals >> fear of hypoglycemia SEs She also stopped Januvia 100 mg in am >> severe UTI. She was on Invokana >> yeast infections.  Pt checks her sugars 0-2x a day: - am: 200s (stress - separating from husband) >> 247, 217, 206, 188 - 2h after b'fast: n/c >> 166 - before lunch: n/c  - 2h after lunch: n/c >> 160, 187 - before dinner: n/c - 2h after dinner: 160s >> n/c - bedtime: n/c - nighttime: n/c No lows. Lowest sugar was 118 >> 160; she has hypoglycemia awareness at 100.  Highest sugar was 260 >> 247.  Glucometer: One Touch Ultra   She continues to eat a more plant-based diet.  - No CKD, last BUN/creatinine:  Lab Results  Component Value Date   BUN 12 10/25/2016   BUN 11 04/05/2016   CREATININE 0.88 10/25/2016   CREATININE 0.91 04/05/2016  She was on Losartan , then switched to spironolactone. - last set of lipids: Lab Results  Component Value Date   CHOL 204 (H) 04/05/2016   HDL 69 04/05/2016   LDLCALC 121 04/05/2016   TRIG 72 04/05/2016   CHOLHDL 3.0 04/05/2016   - last eye exam was in 12/2016 >> No DR - She complains of numbness and tingling. Has neuropathy. She has gastroparesis, for which she was on Reglan  She has a h/o TIA in 07/2014. She is on Plavix and ASA.  Patient also has a history of  hypertension associated with hypokalemia. - dx during pregnancy in 2000 - she had a high aldosterone and low renin in 2013-2014 >> refused further investigation for this as she would not want to pursue Sxin case  investigation positive for primary  hyperaldosteronism   Most recent potassium levels have been normal: Lab Results  Component Value Date   K 3.9 10/25/2016   K 4.1 04/05/2016   K 4.0 10/07/2015   K 4.1 10/05/2015   K 3.7 09/08/2015   She has a family history of HTN, stroke in mother (75s) and brother (at 76 y/o).  She is currently off all her previous medications for blood pressure except Spironolactone 100 mg in am and 50 mg in pm.  Reviewed previous imaging and biochemical studies:   She had an abdominal CT with and without contrast on 06/12/2011, and that was initially read as showing normal adrenals, however, it was addended showing: Addendum #1: Thin cut reconstructions have become available.  With these there is small nodule measuring approx 1.2 by 0.7 cm in  lateral  limb of left adrenal. This could represent either nonfunctioning or  hyperfunctioning adenoma.   She also tells me that that that time, she was found to have a high aldosterone in the low renin. I  do not have these results. On ACTH  and cortisol levels were normal in 2015: Component     Latest Ref Rng & Units 05/18/2014  Cortisol, Plasma     ug/dL 4.6  C206 ACTH     6 - 50 pg/mL 16   Of note, she had several abdominal CTs with contrast (last in 06/18/2014) and her adrenal glands appeared normal, without masses.   Adrenal labs returned normal, however, she did not come back for a dexamethasone suppression test.  Component     Latest Ref Rng & Units 12/14/2015  Epinephrine     pg/mL 20  Norepinephrine     pg/mL 383  Dopamine      REPORT  Catecholamines, Total     pg/mL 403  Metanephrine, Pl     <=57 pg/mL <25  Normetanephrine, Pl     <=148 pg/mL 82  Total Metanephrines-Plasma     <=205  pg/mL 82    ROS: Constitutional: no weight gain/no weight loss, no fatigue, no subjective hyperthermia, no subjective hypothermia Eyes: no blurry vision, no xerophthalmia ENT: no sore throat, no nodules palpated in throat, no dysphagia, no odynophagia, no hoarseness Cardiovascular: no CP/no SOB/no palpitations/no leg swelling Respiratory: no cough/no SOB/no wheezing Gastrointestinal: no N/no V/no D/no C/no acid reflux Musculoskeletal: no muscle aches/no joint aches Skin: no rashes, no hair loss Neurological: no tremors/no numbness/no tingling/no dizziness  I reviewed pt's medications, allergies, PMH, social hx, family hx, and changes were documented in the history of present illness. Otherwise, unchanged from my initial visit note.   Past Medical History:  Diagnosis Date  . Adrenal adenoma   . Allergy   . Anemia   . Diabetes mellitus without complication ([place])   . Endometriosis of rectovaginal septum   . GERD (gastroesophageal reflux disease)   . Headache    MIGRAINES  . Hyperaldosteronism ([place])    Possibly  . Hyperlipidemia   . Hypertension   . Hypokalemia   . Ovarian cyst    Past Surgical History:  Procedure Laterality Date  . CARDIAC CATHETERIZATION    . CESAREAN SECTION     X 2  . ROBOTIC ASSISTED TOTAL HYSTERECTOMY WITH BILATERAL SALPINGO OOPHERECTOMY Bilateral 06/16/2014   Procedure: ROBOTIC ASSISTED TOTAL HYSTERECTOMY WITH BILATERAL SALPINGO OOPHORECTOMY, LYSIS OF ADHESIONS;  Surgeon: Everitt Amber, MD;  Location: [facility];  Service: Gynecology;  Laterality: Bilateral;  . TUBAL LIGATION     Social History   Social History  . Marital Status: Married    Spouse Name: Richard  . Number of Children: 2  . Years of Education: 14   Occupational History  . Works from home at [employer]  . Smoking status: Never Smoker   . Smokeless tobacco: Not on file  . Alcohol Use: No  . Drug Use: No   Social History Narrative   Lives at home  aw/ husband and youngest daughter   Right-handed   Drinks about 16-32 oz of caffeinated beverages per day   Current Outpatient Prescriptions on File Prior to Visit  Medication Sig Dispense Refill  . aspirin 81 MG EC tablet Take 81 mg by mouth every morning.  0  . Blood Glucose Monitoring Suppl (ONE TOUCH ULTRA MINI) w/Device KIT See admin instructions. for testing  0  . Cyanocobalamin (B-12 PO) Take 1 tablet by mouth daily. Reported on 12/14/2015    . Iron TABS Take 1 tablet by mouth daily. Reported on 09/08/2015    . Magnesium 250 MG TABS Take by mouth.    Marland Kitchen  omeprazole (PRILOSEC) 40 MG capsule Take 1 capsule (40 mg total) by mouth daily. 90 capsule 3  . ONE TOUCH ULTRA TEST test strip 1 each by Other route 2 (two) times daily. for testing 100 each 0  . spironolactone (ALDACTONE) 50 MG tablet Take 100 mg in am and 50 mg in pm 270 tablet 3  . Vitamin D, Ergocalciferol, (DRISDOL) 50000 units CAPS capsule Take 50,000 Units by mouth every 7 (seven) days. Reported on 09/29/2015     No current facility-administered medications on file prior to visit.    Allergies  Allergen Reactions  . Ace Inhibitors Cough  . Hctz [Hydrochlorothiazide] Cough   Family History  Problem Relation Age of Onset  . Stroke Mother   . Diabetes Mother   . Diabetes Father   . Hepatitis C Father   . Colon cancer Father   . Pancreatic cancer Father   . Stroke Brother   . Scleroderma Maternal Grandmother   . Stroke Maternal Grandfather   . Breast cancer Neg Hx    PE: LMP 05/16/2014  Wt Readings from Last 3 Encounters:  11/28/16 183 lb 2 oz (83.1 kg)  10/25/16 189 lb (85.7 kg)  10/12/16 189 lb (85.7 kg)   Constitutional: overweight, in NAD Eyes: PERRLA, EOMI, no exophthalmos ENT: moist mucous membranes, no thyromegaly, no cervical lymphadenopathy Cardiovascular: RRR, No MRG Respiratory: CTA B Gastrointestinal: abdomen soft, NT, ND, BS+ Musculoskeletal: no deformities, strength intact in all 4 Skin: moist,  warm, no rashes Neurological: no tremor with outstretched hands, DTR normal in all 4  ASSESSMENT: 1. DM2, non-insulin-dependent, uncontrolled, without complications ? Gastroparesis ? PN  2. HTN  PLAN:  1. Patient with long-standing, uncontrolled diabetes, Returning after a long absence. She was not compliant with visits, sugar checks, or medications. As a consequence, her HbA1c today's 10.4%. She only restarted to check her sugars 4 days ago and restarted metformin yesterday. She would like to continue with this and I advised her to increase the dose as tolerated. Since her sugars are improving, I will not add another medication for now, but I advised her to let me know if the sugars do not decrease to target within the next month. - I suggested to:  Patient Instructions  Please increase Metformin ER to 1000 mg 2x a day.  Check sugars 1-2x a day and bring logs at next visit.  Please come back in July-August.  - today, HbA1c is 7%  - continue checking sugars at different times of the day - check 1x a day, rotating checks - advised for yearly eye exams >> she is UTD - Return to clinic in 3 mo with sugar log   2. HTN - I again reviewed her blood pressures, potassium levels, an imaging study reports - She has a long history of hypertension and also family history of the disease  - she has a history of hypokalemia, which has resolved - At last visit, we discussed about further investigation for her hyperaldosteronism, but I could not check a PRA and aldosterone, since she was on spironolactone. We discussed about the possibility of taking her off the spironolactone for further testing, however, I explained that this is only necessary if she agrees to undergo surgery in case the hyperaldosteronism lateralizes to one of the adrenals. Since she was feeling better on spironolactone and her blood pressure was improving, she was not interested in pursuing further investigation for her  hyperaldosteronism.  - Since last visit, she was able to eliminate  all her other BP meds and only remain on spironolactone, with good blood pressure control. Per review of chart, her potassium levels have also been normal. Will continue only on spironolactone 100 mg in a.m. and 50 mg in p.m. for now. She again confirms that she is not interested in pursuing further investigation for her hyperaldosteronism.  Philemon Kingdom, MD PhD [facility]

## 2017-02-15 NOTE — Progress Notes (Signed)
Complete Physical  Assessment and Plan:  Encounter for general adult medical examination with abnormal findings  Essential hypertension - continue medications, DASH diet, exercise and monitor at home. Call if greater than 130/80.  -     CBC with Differential/Platelet -     Hepatic function panel -     TSH -     BASIC METABOLIC PANEL WITH GFR -     Urinalysis w microscopic + reflex cultur -     Microalbumin / creatinine urine ratio -     EKG 12-Lead  Transient cerebral ischemia, unspecified type Control blood pressure, cholesterol, glucose, increase exercise.   Diabetes mellitus without complication ([place]) Discussed general issues about diabetes pathophysiology and management., Educational material distributed., Suggested low cholesterol diet., Encouraged aerobic exercise., Discussed foot care., Reminded to get yearly retinal exam. 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  -     Lipid panel  Hypokalemia -     BASIC METABOLIC PANEL WITH GFR -     Magnesium  Anemia, unspecified type - monitor, continue iron supp with Vitamin C and increase green leafy veggies  Allergic state, subsequent encounter  Endometriosis of rectovaginal septum  Vitamin D deficiency -     VITAMIN D 25 Hydroxy (Vit-D Deficiency, Fractures)  Screening for cervical cancer -     Cancel: Cytology - PAP -     Cytology - PAP  Vaginal discharge -     Cytology - PAP - pending result may send in flaygl   Discussed med's effects and SE's. Screening labs and tests as requested with regular follow-up as recommended.  HPI 51 y.o. female  presents for a complete physical. Her blood pressure has been controlled at home, today their BP is BP: 124/82 She does workout, she has been walking. She denies chest pain, shortness of breath, dizziness.   She was having stomach issues, is doing better with diet, and she has seen Dr. Havery Moros, has colonoscopy scheduled. She is off the Prilosec and on vinegar water.  She is not on cholesterol medication and denies myalgias. Her cholesterol is not at goal, LDL of 70. The cholesterol last visit was:   Lab Results  Component Value Date   CHOL 204 (H) 04/05/2016   HDL 69 04/05/2016   LDLCALC 121 04/05/2016   TRIG 72 04/05/2016   CHOLHDL 3.0 04/05/2016   She has been working on diet and exercise for diabetes without complications, has not checked sugars, she is on bASA, she is not on ACE/ARB, she is following with Dr. Darnell Level. and denies foot ulcerations, nausea, paresthesia of the feet and visual disturbances.  Last A1C in the office was:  Lab Results  Component Value Date   HGBA1C 10.4 10/12/2016  Has possible history of aldosteronism, follows with Dr. Darnell Level for this and DM, on spirolactone 36m BID and tolerates well.  Lab Results  Component Value Date   GFRAA 88 10/25/2016   Patient is on Vitamin D supplement, 1000 daily. .Marland Kitchen  BMI is Body mass index is 27.48 kg/m., she is working on diet and exercise. Wt Readings from Last 3 Encounters:  02/19/17 183 lb 6.4 oz (83.2 kg)  11/28/16 183 lb 2 oz (83.1 kg)  10/25/16 189 lb (85.7 kg)    Current Medications:  Current Outpatient Prescriptions on File Prior to Visit  Medication  Sig Dispense Refill  . aspirin 81 MG EC tablet Take 81 mg by mouth every morning.  0  . Blood Glucose Monitoring Suppl (ONE TOUCH ULTRA MINI) w/Device KIT See admin instructions. for testing  0  . Cyanocobalamin (B-12 PO) Take 1 tablet by mouth daily. Reported on 12/14/2015    . Iron TABS Take 1 tablet by mouth daily. Reported on 09/08/2015    . Magnesium 250 MG TABS Take by mouth.    Marland Kitchen omeprazole (PRILOSEC) 40 MG capsule Take 1 capsule (40 mg total) by mouth daily. 90 capsule 3  . ONE TOUCH ULTRA TEST test strip 1 each by Other route 2 (two) times daily. for testing 100 each 0   . spironolactone (ALDACTONE) 50 MG tablet Take 100 mg in am and 50 mg in pm 270 tablet 3  . Vitamin D, Ergocalciferol, (DRISDOL) 50000 units CAPS capsule Take 50,000 Units by mouth every 7 (seven) days. Reported on 09/29/2015     No current facility-administered medications on file prior to visit.    Health Maintenance:   Immunization History  Administered Date(s) Administered  . Td 06/06/2007   Tetanus: 2009 Pneumovax: N/A Flu vaccine: Declines Zostavax: N/A Pap: 03/2012 MGM: 10/2016 DEXA: N/A  Colonoscopy:  WILL GET BY END OF YEAR WITH ARMBRUSTER EGD: N/A Ct HEAD 2017 MRI BRAIN 2016  Medical History:  Past Medical History:  Diagnosis Date  . Adrenal adenoma   . Allergy   . Anemia   . Diabetes mellitus without complication ([place])   . Endometriosis of rectovaginal septum   . GERD (gastroesophageal reflux disease)   . Headache    MIGRAINES  . Hyperaldosteronism ([place])    Possibly  . Hyperlipidemia   . Hypertension   . Hypokalemia   . Ovarian cyst    Allergies Allergies  Allergen Reactions  . Ace Inhibitors Cough  . Hctz [Hydrochlorothiazide] Cough    SURGICAL HISTORY She  has a past surgical history that includes Cesarean section; Cardiac catheterization; Tubal ligation; and Robotic assisted total hysterectomy with bilateral salpingo oophorectomy (Bilateral, 06/16/2014). FAMILY HISTORY Her family history includes Colon cancer in her father; Diabetes in her father and mother; Hepatitis C in her father; Pancreatic cancer in her father; Scleroderma in her maternal grandmother; Stroke in her brother, maternal grandfather, and mother. SOCIAL HISTORY She  reports that she has never smoked. She has never used smokeless tobacco. She reports that she drinks alcohol. She reports that she does not use drugs.  Physical Exam: Estimated body mass index is 27.48 kg/m as calculated from the following:   Height as of this encounter: 5' 8.5" (1.74 m).   Weight as of this  encounter: 183 lb 6.4 oz (83.2 kg). BP 124/82   Pulse 88   Temp 97.7 F (36.5 C)   Resp 16   Ht 5' 8.5" (1.74 m) Comment: WITH SHOES  Wt 183 lb 6.4 oz (83.2 kg)   LMP 05/16/2014   SpO2 99%   BMI 27.48 kg/m  General Appearance: Well nourished, in no apparent distress. Eyes: PERRLA, EOMs, conjunctiva no swelling or erythema, normal fundi and vessels. Sinuses: No Frontal/maxillary tenderness ENT/Mouth: Ext aud canals clear, normal light reflex with TMs without erythema, bulging.  Good dentition. No erythema, swelling, or exudate on post pharynx. Tonsils not swollen or erythematous. Hearing normal.  Neck: Supple, thyroid normal. No bruits Respiratory: Respiratory effort normal, BS equal bilaterally without rales, rhonchi, wheezing or stridor. Cardio: RRR without murmurs, rubs or gallops. Brisk peripheral pulses without  edema.  Chest: symmetric, with normal excursions and percussion. Breasts: defer Abdomen: Soft, +BS. Non tender, no guarding, rebound, hernias, masses, or organomegaly. .  Lymphatics: Non tender without lymphadenopathy.  Genitourinary: defer Musculoskeletal: Full ROM all peripheral extremities,5/5 strength, and normal gait. Skin: Warm, dry without rashes, lesions, ecchymosis.  Neuro: Cranial nerves intact, reflexes equal bilaterally. Normal muscle tone, no cerebellar symptoms. Sensation intact.  Psych: Awake and oriented X 3, normal affect, Insight and Judgment appropriate.   EKG: defer     Vicie Mutters 11:11 AM

## 2017-02-19 ENCOUNTER — Ambulatory Visit (INDEPENDENT_AMBULATORY_CARE_PROVIDER_SITE_OTHER): Payer: 59 | Admitting: Physician Assistant

## 2017-02-19 ENCOUNTER — Encounter: Payer: Self-pay | Admitting: Physician Assistant

## 2017-02-19 ENCOUNTER — Other Ambulatory Visit (HOSPITAL_COMMUNITY)
Admission: RE | Admit: 2017-02-19 | Discharge: 2017-02-19 | Disposition: A | Discharge status: Home / Self Care | Payer: 59 | Source: Ambulatory Visit | Attending: Physician Assistant | Admitting: Physician Assistant

## 2017-02-19 VITALS — BP 124/82 | HR 88 | Temp 97.7°F | Resp 16 | Ht 68.5 in | Wt 183.4 lb

## 2017-02-19 DIAGNOSIS — E785 Hyperlipidemia, unspecified: Secondary | ICD-10-CM

## 2017-02-19 DIAGNOSIS — N804 Endometriosis of rectovaginal septum, unspecified involvement of vagina: Secondary | ICD-10-CM

## 2017-02-19 DIAGNOSIS — D649 Anemia, unspecified: Secondary | ICD-10-CM

## 2017-02-19 DIAGNOSIS — I10 Essential (primary) hypertension: Secondary | ICD-10-CM

## 2017-02-19 DIAGNOSIS — N898 Other specified noninflammatory disorders of vagina: Secondary | ICD-10-CM

## 2017-02-19 DIAGNOSIS — R6889 Other general symptoms and signs: Secondary | ICD-10-CM | POA: Diagnosis not present

## 2017-02-19 DIAGNOSIS — E269 Hyperaldosteronism, unspecified: Secondary | ICD-10-CM

## 2017-02-19 DIAGNOSIS — E876 Hypokalemia: Secondary | ICD-10-CM

## 2017-02-19 DIAGNOSIS — Z124 Encounter for screening for malignant neoplasm of cervix: Secondary | ICD-10-CM | POA: Diagnosis not present

## 2017-02-19 DIAGNOSIS — G459 Transient cerebral ischemic attack, unspecified: Secondary | ICD-10-CM

## 2017-02-19 DIAGNOSIS — Z0001 Encounter for general adult medical examination with abnormal findings: Secondary | ICD-10-CM | POA: Diagnosis not present

## 2017-02-19 DIAGNOSIS — T7840XD Allergy, unspecified, subsequent encounter: Secondary | ICD-10-CM

## 2017-02-19 DIAGNOSIS — E119 Type 2 diabetes mellitus without complications: Secondary | ICD-10-CM | POA: Diagnosis not present

## 2017-02-19 DIAGNOSIS — E559 Vitamin D deficiency, unspecified: Secondary | ICD-10-CM

## 2017-02-20 ENCOUNTER — Encounter: Payer: Self-pay | Admitting: Physician Assistant

## 2017-02-20 LAB — HEPATIC FUNCTION PANEL
AG RATIO: 1.6 (calc) (ref 1.0–2.5)
ALKALINE PHOSPHATASE (APISO): 94 U/L (ref 33–130)
ALT: 18 U/L (ref 6–29)
AST: 18 U/L (ref 10–35)
Albumin: 4.6 g/dL (ref 3.6–5.1)
BILIRUBIN INDIRECT: 0.5 mg/dL (ref 0.2–1.2)
BILIRUBIN TOTAL: 0.6 mg/dL (ref 0.2–1.2)
Bilirubin, Direct: 0.1 mg/dL (ref 0.0–0.2)
Globulin: 2.9 g/dL (calc) (ref 1.9–3.7)
TOTAL PROTEIN: 7.5 g/dL (ref 6.1–8.1)

## 2017-02-20 LAB — HEMOGLOBIN A1C
HEMOGLOBIN A1C: 8.7 %{Hb} — AB (ref <5.7)
MEAN PLASMA GLUCOSE: 203 (calc)
eAG (mmol/L): 11.2 (calc)

## 2017-02-20 LAB — URINALYSIS W MICROSCOPIC + REFLEX CULTURE
Bacteria, UA: NONE SEEN /HPF
Bilirubin Urine: NEGATIVE
Glucose, UA: NEGATIVE
Hgb urine dipstick: NEGATIVE
Hyaline Cast: NONE SEEN /LPF
Ketones, ur: NEGATIVE
Leukocyte Esterase: NEGATIVE
Nitrites, Initial: NEGATIVE
Protein, ur: NEGATIVE
RBC / HPF: NONE SEEN /HPF (ref 0–2)
Specific Gravity, Urine: 1.024 (ref 1.001–1.03)
pH: 5.5 (ref 5.0–8.0)

## 2017-02-20 LAB — CBC WITH DIFFERENTIAL/PLATELET
Basophils Absolute: 39 cells/uL (ref 0–200)
Basophils Relative: 0.6 %
Eosinophils Absolute: 33 cells/uL (ref 15–500)
Eosinophils Relative: 0.5 %
HEMATOCRIT: 38.4 % (ref 35.0–45.0)
HEMOGLOBIN: 12.7 g/dL (ref 11.7–15.5)
LYMPHS ABS: 1833 {cells}/uL (ref 850–3900)
MCH: 27.2 pg (ref 27.0–33.0)
MCHC: 33.1 g/dL (ref 32.0–36.0)
MCV: 82.2 fL (ref 80.0–100.0)
MPV: 11.7 fL (ref 7.5–12.5)
Monocytes Relative: 5.9 %
Neutro Abs: 4212 cells/uL (ref 1500–7800)
Neutrophils Relative %: 64.8 %
Platelets: 189 10*3/uL (ref 140–400)
RBC: 4.67 10*6/uL (ref 3.80–5.10)
RDW: 12.8 % (ref 11.0–15.0)
Total Lymphocyte: 28.2 %
WBC: 6.5 10*3/uL (ref 3.8–10.8)
WBCMIX: 384 {cells}/uL (ref 200–950)

## 2017-02-20 LAB — BASIC METABOLIC PANEL WITH GFR
BUN: 10 mg/dL (ref 7–25)
CHLORIDE: 104 mmol/L (ref 98–110)
CO2: 25 mmol/L (ref 20–32)
Calcium: 10 mg/dL (ref 8.6–10.4)
Creat: 0.8 mg/dL (ref 0.50–1.05)
GFR, EST AFRICAN AMERICAN: 99 mL/min/{1.73_m2} (ref >=60)
GFR, EST NON AFRICAN AMERICAN: 85 mL/min/{1.73_m2} (ref >=60)
Glucose, Bld: 174 mg/dL — ABNORMAL HIGH (ref 65–99)
POTASSIUM: 4.2 mmol/L (ref 3.5–5.3)
SODIUM: 138 mmol/L (ref 135–146)

## 2017-02-20 LAB — VITAMIN D 25 HYDROXY (VIT D DEFICIENCY, FRACTURES): VIT D 25 HYDROXY: 38 ng/mL (ref 30–100)

## 2017-02-20 LAB — LIPID PANEL
Cholesterol: 193 mg/dL (ref <200)
HDL: 71 mg/dL (ref >50)
LDL Cholesterol (Calc): 105 mg/dL (calc) — ABNORMAL HIGH
NON-HDL CHOLESTEROL (CALC): 122 mg/dL (ref <130)
Total CHOL/HDL Ratio: 2.7 (calc) (ref <5.0)
Triglycerides: 80 mg/dL (ref <150)

## 2017-02-20 LAB — MICROALBUMIN / CREATININE URINE RATIO
Creatinine, Urine: 219 mg/dL (ref 20–275)
Microalb Creat Ratio: 3 ug/mg{creat}
Microalb, Ur: 0.7 mg/dL

## 2017-02-20 LAB — MAGNESIUM: MAGNESIUM: 1.9 mg/dL (ref 1.5–2.5)

## 2017-02-20 LAB — TSH: TSH: 2.36 mIU/L

## 2017-02-20 LAB — NO CULTURE INDICATED

## 2017-02-20 MED ORDER — VITAMIN D (ERGOCALCIFEROL) 1.25 MG (50000 UNIT) PO CAPS: 50000.0000 [IU] | ORAL_CAPSULE | ORAL | 2 refills | Status: DC

## 2017-02-21 ENCOUNTER — Other Ambulatory Visit: Payer: Self-pay | Admitting: Physician Assistant

## 2017-02-21 LAB — CYTOLOGY - PAP
Bacterial vaginitis: POSITIVE — AB
CANDIDA VAGINITIS: NEGATIVE
DIAGNOSIS: NEGATIVE
HPV (WINDOPATH): NOT DETECTED

## 2017-02-21 MED ORDER — METRONIDAZOLE 500 MG PO TABS: 500.0000 mg | ORAL_TABLET | Freq: Two times a day (BID) | ORAL | 0 refills | Status: AC

## 2017-05-09 ENCOUNTER — Ambulatory Visit: Payer: Self-pay | Admitting: Physician Assistant

## 2017-05-10 ENCOUNTER — Telehealth: Payer: Self-pay | Admitting: Physician Assistant

## 2017-05-10 MED ORDER — PREDNISONE 20 MG PO TABS: ORAL_TABLET | ORAL | 0 refills | Status: AC

## 2017-05-10 NOTE — Telephone Encounter (Signed)
Informed pt of Rx that has been sent to pharmacy. Dec 6th 2018 by DD

## 2017-05-10 NOTE — Telephone Encounter (Signed)
Patient has history of back pain, states back has been hurting x several days, requesting prednisone pack.   Will send in Go to the ER if you have any new weakness in your legs, have trouble controlling your urine or bowels, or have worsening pain.  If not better needs OV

## 2017-05-17 ENCOUNTER — Encounter: Payer: Self-pay | Admitting: Physician Assistant

## 2017-05-17 ENCOUNTER — Ambulatory Visit (INDEPENDENT_AMBULATORY_CARE_PROVIDER_SITE_OTHER): Payer: 59 | Admitting: Physician Assistant

## 2017-05-17 VITALS — BP 136/80 | Temp 97.3°F | Resp 16 | Ht 68.5 in | Wt 184.2 lb

## 2017-05-17 DIAGNOSIS — E269 Hyperaldosteronism, unspecified: Secondary | ICD-10-CM

## 2017-05-17 DIAGNOSIS — I10 Essential (primary) hypertension: Secondary | ICD-10-CM | POA: Diagnosis not present

## 2017-05-17 DIAGNOSIS — E119 Type 2 diabetes mellitus without complications: Secondary | ICD-10-CM | POA: Diagnosis not present

## 2017-05-17 DIAGNOSIS — E785 Hyperlipidemia, unspecified: Secondary | ICD-10-CM

## 2017-05-17 NOTE — Progress Notes (Signed)
FOLLOW UP VISIT  Assessment and Plan:  Essential hypertension - continue medications, DASH diet, exercise and monitor at home. Call if greater than 130/80.  -     CBC with Differential/Platelet -     Hepatic function panel -     TSH -     BASIC METABOLIC PANEL WITH GFR  Diabetes mellitus without complication ([place]) Discussed general issues about diabetes pathophysiology and management., Educational material distributed., Suggested low cholesterol diet., Encouraged aerobic exercise., Discussed foot care., Reminded to get yearly retinal exam. 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  -     Lipid panel  Anemia, unspecified type - monitor, continue iron supp with Vitamin C and increase green leafy veggies  Discussed med's effects and SE's. Screening labs and tests as requested with regular follow-up as recommended. Future Appointments  Date Time Provider [place]  02/20/2018  9:00 AM Vicie Mutters, PA-C [id number] None    HPI 51 y.o. female  presents for a follow up.  She is having neck pain, some pain in her left shoulder, neck, some left arm pain. Seeing chiropractor, did not do prednisone. Would like to see PT.  Her blood pressure has been controlled at home, today their BP is BP: 136/80 She does workout, she has been walking. She denies chest pain, shortness of breath, dizziness.  She is not on cholesterol medication and denies myalgias. Her cholesterol is not at goal, LDL of 70. The cholesterol last visit was:   Lab Results  Component Value Date   CHOL 193 02/19/2017   HDL 71 02/19/2017   LDLCALC 121 04/05/2016   TRIG 80 02/19/2017   CHOLHDL 2.7 02/19/2017   She has been working on diet and exercise for diabetes without complications, has not checked sugars, she is on bASA, she is not on ACE/ARB, she  is following with Dr. Darnell Level. and denies foot ulcerations, nausea, paresthesia of the feet and visual disturbances.  Last A1C in the office was:  Lab Results  Component Value Date   HGBA1C 8.7 (H) 02/19/2017  Has possible history of aldosteronism, follows with Dr. Darnell Level for this and DM, on spirolactone 56m BID and tolerates well.  Lab Results  Component Value Date   GFRAA 99 02/19/2017   Patient is on Vitamin D supplement, 1000 daily. Lab Results  Component Value Date   VD25OH 38 02/19/2017     BMI is Body mass index is 27.6 kg/m., she is working on diet and exercise. Wt Readings from Last 3 Encounters:  05/17/17 184 lb 3.2 oz (83.6 kg)  02/19/17 183 lb 6.4 oz (83.2 kg)  11/28/16 183 lb 2 oz (83.1 kg)    Current Medications:  Current Outpatient Medications on File Prior to Visit  Medication Sig Dispense Refill  . aspirin 81 MG EC tablet Take 81 mg by mouth every morning.  0  . Blood Glucose Monitoring Suppl (ONE TOUCH ULTRA MINI) w/Device KIT See admin instructions. for testing  0  . Cyanocobalamin (B-12 PO) Take 1 tablet by mouth daily. Reported on 12/14/2015    . Iron TABS Take 1 tablet by mouth daily. Reported on 09/08/2015    . Magnesium 250 MG TABS Take by mouth.    .Marland Kitchenomeprazole (PRILOSEC) 40 MG capsule Take 1 capsule (40 mg total) by mouth daily. 90 capsule 3  . ONE  TOUCH ULTRA TEST test strip 1 each by Other route 2 (two) times daily. for testing 100 each 0  . spironolactone (ALDACTONE) 50 MG tablet Take 100 mg in am and 50 mg in pm 270 tablet 3  . Vitamin D, Ergocalciferol, (DRISDOL) 50000 units CAPS capsule Take 1 capsule (50,000 Units total) by mouth every 7 (seven) days. Reported on 09/29/2015 30 capsule 2  . predniSONE (DELTASONE) 20 MG tablet 1 pill 3 x a day for 3 days, 1 pill 2 x a day x 3 days, 1 pill a day x 5 days with food (Patient not taking: Reported on 05/17/2017) 20 tablet 0   No current facility-administered medications on file prior to visit.    Medical History:   Past Medical History:  Diagnosis Date  . Adrenal adenoma   . Allergy   . Anemia   . Diabetes mellitus without complication ([place])   . Endometriosis of rectovaginal septum   . GERD (gastroesophageal reflux disease)   . Headache    MIGRAINES  . Hyperaldosteronism ([place])    Possibly  . Hyperlipidemia   . Hypertension   . Hypokalemia   . Ovarian cyst    Allergies Allergies  Allergen Reactions  . Ace Inhibitors Cough  . Hctz [Hydrochlorothiazide] Cough   Surgical History: reviewed and unchanged Family History: reviewed and unchanged Social History: reviewed and unchanged  Physical Exam: Estimated body mass index is 27.6 kg/m as calculated from the following:   Height as of this encounter: 5' 8.5" (1.74 m).   Weight as of this encounter: 184 lb 3.2 oz (83.6 kg). BP 136/80   Temp (!) 97.3 F (36.3 C)   Resp 16   Ht 5' 8.5" (1.74 m)   Wt 184 lb 3.2 oz (83.6 kg)   LMP 05/16/2014   BMI 27.60 kg/m  General Appearance: Well nourished, in no apparent distress. Eyes: PERRLA, EOMs, conjunctiva no swelling or erythema, normal fundi and vessels. Sinuses: No Frontal/maxillary tenderness ENT/Mouth: Ext aud canals clear, normal light reflex with TMs without erythema, bulging.  Good dentition. No erythema, swelling, or exudate on post pharynx. Tonsils not swollen or erythematous. Hearing normal.  Neck: Supple, thyroid normal. No bruits Respiratory: Respiratory effort normal, BS equal bilaterally without rales, rhonchi, wheezing or stridor. Cardio: RRR without murmurs, rubs or gallops. Brisk peripheral pulses without edema.  Chest: symmetric, with normal excursions and percussion. Breasts: defer Abdomen: Soft, +BS. Non tender, no guarding, rebound, hernias, masses, or organomegaly. .  Lymphatics: Non tender without lymphadenopathy.  Genitourinary: defer Musculoskeletal: Full ROM all peripheral extremities,5/5 strength, and normal gait. Skin: Warm, dry without rashes, lesions,  ecchymosis.  Neuro: Cranial nerves intact, reflexes equal bilaterally. Normal muscle tone, no cerebellar symptoms. Sensation intact.  Psych: Awake and oriented X 3, normal affect, Insight and Judgment appropriate.    Vicie Mutters 4:39 PM

## 2017-05-17 NOTE — Patient Instructions (Addendum)
Being dehydrated can hurt your kidneys, cause fatigue, headaches, muscle aches, joint pain, and dry skin/nails so please increase your fluids.   Drink 80-100 oz a day of water, measure it out!  Can check out plantnanny app on your phone to help you keep track of your water  Tumeric with black pepper extract is a great natural antiinflammatory that helps with arthritis and aches and pain. Can get from [employer] or any health food store. Need to take at least 800mg  twice a day with food.    Go to the ER if you have any new weakness in your arms, trouble with your grip, worse headache ever, fever, chills. or have worsening pain.   If you are not better in 1-3 month we will refer you to ortho or get MRI   Cervical Sprain A cervical sprain is a stretch or tear in one or more of the tough, cord-like tissues that connect bones (ligaments) in the neck. Cervical sprains can range from mild to severe. Severe cervical sprains can cause the spinal bones (vertebrae) in the neck to be unstable. This can lead to spinal cord damage and can result in serious nervous system problems. The amount of time that it takes for a cervical sprain to get better depends on the cause and extent of the injury. Most cervical sprains heal in 4-6 weeks. What are the causes? Cervical sprains may be caused by an injury (trauma), such as from a motor vehicle accident, a fall, or sudden forward and backward whipping movement of the head and neck (whiplash injury). Mild cervical sprains may be caused by wear and tear over time, such as from poor posture, sitting in a chair that does not provide support, or looking up or down for long periods of time. What increases the risk? The following factors may make you more likely to develop this condition:  Participating in activities that have a high risk of trauma to the neck. These include contact sports, auto racing, gymnastics, and diving.  Taking risks when driving or riding in a motor  vehicle, such as speeding.  Having osteoarthritis of the spine.  Having poor strength and flexibility of the neck.  A previous neck injury.  Having poor posture.  Spending a lot of time in certain positions that put stress on the neck, such as sitting at a computer for long periods of time.  What are the signs or symptoms? Symptoms of this condition include:  Pain, soreness, stiffness, tenderness, swelling, or a burning sensation in the front, back, or sides of the neck.  Sudden tightening of neck muscles that you cannot control (muscle spasms).  Pain in the shoulders or upper back.  Limited ability to move the neck.  Headache.  Dizziness.  Nausea.  Vomiting.  Weakness, numbness, or tingling in a hand or an arm.  Symptoms may develop right away after injury, or they may develop over a few days. In some cases, symptoms may go away with treatment and return (recur) over time. How is this diagnosed? This condition may be diagnosed based on:  Your medical history.  Your symptoms.  Any recent injuries or known neck problems that you have, such as arthritis in the neck.  A physical exam.  Imaging tests, such as: ? X-rays. ? MRI. ? CT scan.  How is this treated? This condition is treated by resting and icing the injured area and doing physical therapy exercises. Depending on the severity of your condition, treatment may also include:  Keeping your neck in place (immobilized) for periods of time. This may be done using: ? A cervical collar. This supports your chin and the back of your head. ? A cervical traction device. This is a sling that holds up your head. This removes weight and pressure from your neck, and it may help to relieve pain.  Medicines that help to relieve pain and inflammation.  Medicines that help to relax your muscles (muscle relaxants).  Surgery. This is rare.  Follow these instructions at home: If you have a cervical collar:  Wear it as  told by your health care provider. Do not remove the collar unless instructed by your health care provider.  Ask your health care provider before you make any adjustments to your collar.  If you have long hair, keep it outside of the collar.  Ask your health care provider if you can remove the collar for cleaning and bathing. If you are allowed to remove the collar for cleaning or bathing: ? Follow instructions from your health care provider about how to remove the collar safely. ? Clean the collar by wiping it with mild soap and water and drying it completely. ? If your collar has removable pads, remove them every 1-2 days and wash them by hand with soap and water. Let them air-dry completely before you put them back in the collar. ? Check your skin under the collar for irritation or sores. If you see any, tell your health care provider. Managing pain, stiffness, and swelling  If directed, use a cervical traction device as told by your health care provider.  If directed, apply heat to the affected area before you do your physical therapy or as often as told by your health care provider. Use the heat source that your health care provider recommends, such as a moist heat pack or a heating pad. ? Place a towel between your skin and the heat source. ? Leave the heat on for 20-30 minutes. ? Remove the heat if your skin turns bright red. This is especially important if you are unable to feel pain, heat, or cold. You may have a greater risk of getting burned.  If directed, put ice on the affected area: ? Put ice in a plastic bag. ? Place a towel between your skin and the bag. ? Leave the ice on for 20 minutes, 2-3 times a day. Activity  Do not drive while wearing a cervical collar. If you do not have a cervical collar, ask your health care provider if it is safe to drive while your neck heals.  Do not drive or use heavy machinery while taking prescription pain medicine or muscle relaxants,  unless your health care provider approves.  Do not lift anything that is heavier than 10 lb (4.5 kg) until your health care provider tells you that it is safe.  Rest as directed by your health care provider. Avoid positions and activities that make your symptoms worse. Ask your health care provider what activities are safe for you.  If physical therapy was prescribed, do exercises as told by your health care provider or physical therapist. General instructions  Take over-the-counter and prescription medicines only as told by your health care provider.  Do not use any products that contain nicotine or tobacco, such as cigarettes and e-cigarettes. These can delay healing. If you need help quitting, ask your health care provider.  Keep all follow-up visits as told by your health care provider or physical therapist. This is  important. How is this prevented? To prevent a cervical sprain from happening again:  Use and maintain good posture. Make any needed adjustments to your workstation to help you use good posture.  Exercise regularly as directed by your health care provider or physical therapist.  Avoid risky activities that may cause a cervical sprain.  Contact a health care provider if:  You have symptoms that get worse or do not get better after 2 weeks of treatment.  You have pain that gets worse or does not get better with medicine.  You develop new, unexplained symptoms.  You have sores or irritated skin on your neck from wearing your cervical collar. Get help right away if:  You have severe pain.  You develop numbness, tingling, or weakness in any part of your body.  You cannot move a part of your body (you have paralysis).  You have neck pain along with: ? Severe dizziness. ? Headache. Summary  A cervical sprain is a stretch or tear in one or more of the tough, cord-like tissues that connect bones (ligaments) in the neck.  Cervical sprains may be caused by an  injury (trauma), such as from a motor vehicle accident, a fall, or sudden forward and backward whipping movement of the head and neck (whiplash injury).  Symptoms may develop right away after injury, or they may develop over a few days.  This condition is treated by resting and icing the injured area and doing physical therapy exercises. This information is not intended to replace advice given to you by your health care provider. Make sure you discuss any questions you have with your health care provider. Document Released: 03/19/2007 Document Revised: 01/19/2016 Document Reviewed: 01/19/2016 Elsevier Interactive Patient Education  2017 [employer]   Cervical Strain and Sprain Rehab Ask your health care provider which exercises are safe for you. Do exercises exactly as told by your health care provider and adjust them as directed. It is normal to feel mild stretching, pulling, tightness, or discomfort as you do these exercises, but you should stop right away if you feel sudden pain or your pain gets worse.Do not begin these exercises until told by your health care provider. Stretching and range of motion exercises These exercises warm up your muscles and joints and improve the movement and flexibility of your neck. These exercises also help to relieve pain, numbness, and tingling. Exercise A: Cervical side bend  1. Using good posture, sit on a stable chair or stand up. 2. Without moving your shoulders, slowly tilt your left / right ear to your shoulder until you feel a stretch in your neck muscles. You should be looking straight ahead. 3. Hold for __________ seconds. 4. Repeat with the other side of your neck. Repeat __________ times. Complete this exercise __________ times a day. Exercise B: Cervical rotation  1. Using good posture, sit on a stable chair or stand up. 2. Slowly turn your head to the side as if you are looking over your left / right shoulder. ? Keep your eyes level with  the ground. ? Stop when you feel a stretch along the side and the back of your neck. 3. Hold for __________ seconds. 4. Repeat this by turning to your other side. Repeat __________ times. Complete this exercise __________ times a day. Exercise C: Thoracic extension and pectoral stretch 1. Roll a towel or a small blanket so it is about 4 inches (10 cm) in diameter. 2. Lie down on your back on a firm  surface. 3. Put the towel lengthwise, under your spine in the middle of your back. It should not be not under your shoulder blades. The towel should line up with your spine from your middle back to your lower back. 4. Put your hands behind your head and let your elbows fall out to your sides. 5. Hold for __________ seconds. Repeat __________ times. Complete this exercise __________ times a day. Strengthening exercises These exercises build strength and endurance in your neck. Endurance is the ability to use your muscles for a long time, even after your muscles get tired. Exercise D: Upper cervical flexion, isometric 1. Lie on your back with a thin pillow behind your head and a small rolled-up towel under your neck. 2. Gently tuck your chin toward your chest and nod your head down to look toward your feet. Do not lift your head off the pillow. 3. Hold for __________ seconds. 4. Release the tension slowly. Relax your neck muscles completely before you repeat this exercise. Repeat __________ times. Complete this exercise __________ times a day. Exercise E: Cervical extension, isometric  1. Stand about 6 inches (15 cm) away from a wall, with your back facing the wall. 2. Place a soft object, about 6-8 inches (15-20 cm) in diameter, between the back of your head and the wall. A soft object could be a small pillow, a ball, or a folded towel. 3. Gently tilt your head back and press into the soft object. Keep your jaw and forehead relaxed. 4. Hold for __________ seconds. 5. Release the tension slowly.  Relax your neck muscles completely before you repeat this exercise. Repeat __________ times. Complete this exercise __________ times a day. Posture and body mechanics  Body mechanics refers to the movements and positions of your body while you do your daily activities. Posture is part of body mechanics. Good posture and healthy body mechanics can help to relieve stress in your body's tissues and joints. Good posture means that your spine is in its natural S-curve position (your spine is neutral), your shoulders are pulled back slightly, and your head is not tipped forward. The following are general guidelines for applying improved posture and body mechanics to your everyday activities. Standing  When standing, keep your spine neutral and keep your feet about hip-width apart. Keep a slight bend in your knees. Your ears, shoulders, and hips should line up.  When you do a task in which you stand in one place for a long time, place one foot up on a stable object that is 2-4 inches (5-10 cm) high, such as a footstool. This helps keep your spine neutral. Sitting   When sitting, keep your spine neutral and your keep feet flat on the floor. Use a footrest, if necessary, and keep your thighs parallel to the floor. Avoid rounding your shoulders, and avoid tilting your head forward.  When working at a desk or a computer, keep your desk at a height where your hands are slightly lower than your elbows. Slide your chair under your desk so you are close enough to maintain good posture.  When working at a computer, place your monitor at a height where you are looking straight ahead and you do not have to tilt your head forward or downward to look at the screen. Resting When lying down and resting, avoid positions that are most painful for you. Try to support your neck in a neutral position. You can use a contour pillow or a small rolled-up towel. Your  pillow should support your neck but not push on it. This  information is not intended to replace advice given to you by your health care provider. Make sure you discuss any questions you have with your health care provider. Document Released: 05/22/2005 Document Revised: 01/27/2016 Document Reviewed: 04/28/2015 Elsevier Interactive Patient Education  [employer].

## 2017-05-18 LAB — BASIC METABOLIC PANEL WITH GFR
BUN: 12 mg/dL (ref 7–25)
CALCIUM: 10.2 mg/dL (ref 8.6–10.4)
CHLORIDE: 102 mmol/L (ref 98–110)
CO2: 25 mmol/L (ref 20–32)
CREATININE: 0.82 mg/dL (ref 0.50–1.05)
GFR, Est African American: 95 mL/min/{1.73_m2} (ref >=60)
GFR, Est Non African American: 82 mL/min/{1.73_m2} (ref >=60)
GLUCOSE: 136 mg/dL — AB (ref 65–99)
Potassium: 4 mmol/L (ref 3.5–5.3)
Sodium: 137 mmol/L (ref 135–146)

## 2017-05-18 LAB — CBC WITH DIFFERENTIAL/PLATELET
BASOS PCT: 0.5 %
Basophils Absolute: 37 cells/uL (ref 0–200)
EOS PCT: 0.4 %
Eosinophils Absolute: 29 cells/uL (ref 15–500)
HCT: 40.2 % (ref 35.0–45.0)
HEMOGLOBIN: 13.3 g/dL (ref 11.7–15.5)
Lymphs Abs: 2241 cells/uL (ref 850–3900)
MCH: 27.1 pg (ref 27.0–33.0)
MCHC: 33.1 g/dL (ref 32.0–36.0)
MCV: 82 fL (ref 80.0–100.0)
MONOS PCT: 4.8 %
MPV: 11.4 fL (ref 7.5–12.5)
NEUTROS ABS: 4643 {cells}/uL (ref 1500–7800)
Neutrophils Relative %: 63.6 %
PLATELETS: 214 10*3/uL (ref 140–400)
RBC: 4.9 10*6/uL (ref 3.80–5.10)
RDW: 12.7 % (ref 11.0–15.0)
TOTAL LYMPHOCYTE: 30.7 %
WBC mixed population: 350 cells/uL (ref 200–950)
WBC: 7.3 10*3/uL (ref 3.8–10.8)

## 2017-05-18 LAB — HEPATIC FUNCTION PANEL
AG Ratio: 1.7 (calc) (ref 1.0–2.5)
ALBUMIN MSPROF: 4.7 g/dL (ref 3.6–5.1)
ALT: 27 U/L (ref 6–29)
AST: 19 U/L (ref 10–35)
Alkaline phosphatase (APISO): 85 U/L (ref 33–130)
BILIRUBIN DIRECT: 0.1 mg/dL (ref 0.0–0.2)
BILIRUBIN TOTAL: 0.5 mg/dL (ref 0.2–1.2)
GLOBULIN: 2.8 g/dL (ref 1.9–3.7)
Indirect Bilirubin: 0.4 mg/dL (calc) (ref 0.2–1.2)
Total Protein: 7.5 g/dL (ref 6.1–8.1)

## 2017-05-18 LAB — HEMOGLOBIN A1C
HEMOGLOBIN A1C: 8.1 %{Hb} — AB (ref <5.7)
Mean Plasma Glucose: 186 (calc)
eAG (mmol/L): 10.3 (calc)

## 2017-08-15 ENCOUNTER — Other Ambulatory Visit: Payer: Self-pay

## 2017-08-15 MED ORDER — SPIRONOLACTONE 50 MG PO TABS: ORAL_TABLET | ORAL | 3 refills | Status: DC

## 2018-02-15 NOTE — Progress Notes (Deleted)
Complete Physical  Assessment and Plan:  Encounter for general adult medical examination with abnormal findings  Essential hypertension - continue medications, DASH diet, exercise and monitor at home. Call if greater than 130/80.  -     CBC with Differential/Platelet -     Hepatic function panel -     TSH -     BASIC METABOLIC PANEL WITH GFR -     Urinalysis w microscopic + reflex cultur -     Microalbumin / creatinine urine ratio -     EKG 12-Lead  Transient cerebral ischemia, unspecified type Control blood pressure, cholesterol, glucose, increase exercise.   Diabetes mellitus without complication ([place]) Discussed general issues about diabetes pathophysiology and management., Educational material distributed., Suggested low cholesterol diet., Encouraged aerobic exercise., Discussed foot care., Reminded to get yearly retinal exam. 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  -     Lipid panel  Hypokalemia -     BASIC METABOLIC PANEL WITH GFR -     Magnesium  Anemia, unspecified type - monitor, continue iron supp with Vitamin C and increase green leafy veggies  Allergic state, subsequent encounter  Endometriosis of rectovaginal septum  Vitamin D deficiency -     VITAMIN D 25 Hydroxy (Vit-D Deficiency, Fractures)  Screening for cervical cancer -     Cancel: Cytology - PAP -     Cytology - PAP  Vaginal discharge -     Cytology - PAP - pending result may send in flaygl   Discussed med's effects and SE's. Screening labs and tests as requested with regular follow-up as recommended.  HPI 52 y.o. female  presents for a complete physical. Her blood pressure has been controlled at home, today their BP is   She does workout, she has been walking. She denies chest pain, shortness of breath, dizziness.  She was  having stomach issues, is doing better with diet, and she has seen Dr. Havery Moros, has colonoscopy scheduled. She is off the Prilosec and on vinegar water.  She is not on cholesterol medication and denies myalgias. Her cholesterol is not at goal, LDL of 70. The cholesterol last visit was:   Lab Results  Component Value Date   CHOL 193 02/19/2017   HDL 71 02/19/2017   LDLCALC 105 (H) 02/19/2017   TRIG 80 02/19/2017   CHOLHDL 2.7 02/19/2017   She has been working on diet and exercise for diabetes without complications, has not checked sugars, she is on bASA, she is not on ACE/ARB, she is following with Dr. Darnell Level. and denies foot ulcerations, nausea, paresthesia of the feet and visual disturbances.  Last A1C in the office was:  Lab Results  Component Value Date   HGBA1C 8.1 (H) 05/17/2017  Has possible history of aldosteronism, follows with Dr. Darnell Level for this and DM, on spirolactone 96m BID and tolerates well.  Lab Results  Component Value Date   GFRAA 95 05/17/2017   Patient is on Vitamin D supplement, 1000 daily. .Marland Kitchen  BMI is There is no height or weight on file to calculate BMI., she is working on diet and exercise. Wt Readings from Last 3 Encounters:  05/17/17 184 lb 3.2 oz (83.6 kg)  02/19/17 183 lb 6.4 oz (83.2 kg)  11/28/16 183 lb 2 oz (83.1 kg)    Current Medications:  Current Outpatient  Medications on File Prior to Visit  Medication Sig Dispense Refill  . aspirin 81 MG EC tablet Take 81 mg by mouth every morning.  0  . Blood Glucose Monitoring Suppl (ONE TOUCH ULTRA MINI) w/Device KIT See admin instructions. for testing  0  . Cyanocobalamin (B-12 PO) Take 1 tablet by mouth daily. Reported on 12/14/2015    . Iron TABS Take 1 tablet by mouth daily. Reported on 09/08/2015    . Magnesium 250 MG TABS Take by mouth.    Marland Kitchen omeprazole (PRILOSEC) 40 MG capsule Take 1 capsule (40 mg total) by mouth daily. 90 capsule 3  . ONE TOUCH ULTRA TEST test strip 1 each by Other route 2 (two) times daily.  for testing 100 each 0  . spironolactone (ALDACTONE) 50 MG tablet Take 100 mg in am and 50 mg in pm 270 tablet 3  . Vitamin D, Ergocalciferol, (DRISDOL) 50000 units CAPS capsule Take 1 capsule (50,000 Units total) by mouth every 7 (seven) days. Reported on 09/29/2015 30 capsule 2   No current facility-administered medications on file prior to visit.    Health Maintenance:   Immunization History  Administered Date(s) Administered  . Td 06/06/2007   Tetanus: 2009 Pneumovax: N/A Flu vaccine: Declines Zostavax: N/A Pap: 03/2012 MGM: 10/2016 DEXA: N/A  Colonoscopy:  WILL GET BY END OF YEAR WITH ARMBRUSTER EGD: N/A Ct HEAD 2017 MRI BRAIN 2016  Medical History:  Past Medical History:  Diagnosis Date  . Adrenal adenoma   . Allergy   . Anemia   . Diabetes mellitus without complication ([place])   . Endometriosis of rectovaginal septum   . GERD (gastroesophageal reflux disease)   . Headache    MIGRAINES  . Hyperaldosteronism ([place])    Possibly  . Hyperlipidemia   . Hypertension   . Hypokalemia   . Ovarian cyst    Allergies Allergies  Allergen Reactions  . Ace Inhibitors Cough  . Hctz [Hydrochlorothiazide] Cough    SURGICAL HISTORY She  has a past surgical history that includes Cesarean section; Cardiac catheterization; Tubal ligation; and Robotic assisted total hysterectomy with bilateral salpingo oophorectomy (Bilateral, 06/16/2014). FAMILY HISTORY Her family history includes Colon cancer in her father; Diabetes in her father and mother; Hepatitis C in her father; Pancreatic cancer in her father; Scleroderma in her maternal grandmother; Stroke in her brother, maternal grandfather, and mother. SOCIAL HISTORY She  reports that she has never smoked. She has never used smokeless tobacco. She reports that she drinks alcohol. She reports that she does not use drugs.  Physical Exam: Estimated body mass index is 27.6 kg/m as calculated from the following:   Height as of 05/17/17: 5'  8.5" (1.74 m).   Weight as of 05/17/17: 184 lb 3.2 oz (83.6 kg). LMP 05/16/2014  General Appearance: Well nourished, in no apparent distress. Eyes: PERRLA, EOMs, conjunctiva no swelling or erythema, normal fundi and vessels. Sinuses: No Frontal/maxillary tenderness ENT/Mouth: Ext aud canals clear, normal light reflex with TMs without erythema, bulging.  Good dentition. No erythema, swelling, or exudate on post pharynx. Tonsils not swollen or erythematous. Hearing normal.  Neck: Supple, thyroid normal. No bruits Respiratory: Respiratory effort normal, BS equal bilaterally without rales, rhonchi, wheezing or stridor. Cardio: RRR without murmurs, rubs or gallops. Brisk peripheral pulses without edema.  Chest: symmetric, with normal excursions and percussion. Breasts: defer Abdomen: Soft, +BS. Non tender, no guarding, rebound, hernias, masses, or organomegaly. .  Lymphatics: Non tender without lymphadenopathy.  Genitourinary: defer Musculoskeletal: Full ROM  all peripheral extremities,5/5 strength, and normal gait. Skin: Warm, dry without rashes, lesions, ecchymosis.  Neuro: Cranial nerves intact, reflexes equal bilaterally. Normal muscle tone, no cerebellar symptoms. Sensation intact.  Psych: Awake and oriented X 3, normal affect, Insight and Judgment appropriate.   EKG: defer     Vicie Mutters 9:35 AM

## 2018-02-20 ENCOUNTER — Encounter: Payer: Self-pay | Admitting: Physician Assistant

## 2018-02-21 ENCOUNTER — Encounter: Payer: Self-pay | Admitting: Internal Medicine

## 2018-02-21 DIAGNOSIS — Z0100 Encounter for examination of eyes and vision without abnormal findings: Secondary | ICD-10-CM | POA: Diagnosis not present

## 2018-02-21 DIAGNOSIS — H5213 Myopia, bilateral: Secondary | ICD-10-CM | POA: Diagnosis not present

## 2018-02-21 LAB — HM DIABETES EYE EXAM

## 2018-03-10 DIAGNOSIS — N3001 Acute cystitis with hematuria: Secondary | ICD-10-CM | POA: Diagnosis not present

## 2018-03-11 ENCOUNTER — Other Ambulatory Visit: Payer: Self-pay | Admitting: Physician Assistant

## 2018-03-26 ENCOUNTER — Other Ambulatory Visit: Payer: Self-pay | Admitting: Physician Assistant

## 2018-03-26 DIAGNOSIS — Z1231 Encounter for screening mammogram for malignant neoplasm of breast: Secondary | ICD-10-CM

## 2018-04-08 ENCOUNTER — Ambulatory Visit (HOSPITAL_COMMUNITY)
Admission: RE | Admit: 2018-04-08 | Discharge: 2018-04-08 | Disposition: A | Discharge status: Home / Self Care | Payer: 59 | Source: Ambulatory Visit | Attending: Physician Assistant | Admitting: Physician Assistant

## 2018-04-08 DIAGNOSIS — Z1231 Encounter for screening mammogram for malignant neoplasm of breast: Secondary | ICD-10-CM | POA: Insufficient documentation

## 2019-01-03 ENCOUNTER — Other Ambulatory Visit: Payer: Self-pay | Admitting: Physician Assistant

## 2019-01-03 MED ORDER — METFORMIN HCL ER 500 MG PO TB24: ORAL_TABLET | ORAL | 99 refills | Status: DC

## 2019-01-06 ENCOUNTER — Other Ambulatory Visit: Payer: Self-pay

## 2019-01-06 ENCOUNTER — Ambulatory Visit (INDEPENDENT_AMBULATORY_CARE_PROVIDER_SITE_OTHER): Payer: 59 | Admitting: Physician Assistant

## 2019-01-06 ENCOUNTER — Encounter: Payer: Self-pay | Admitting: Physician Assistant

## 2019-01-06 VITALS — BP 128/72 | HR 68 | Temp 97.6°F | Ht 67.5 in | Wt 194.4 lb

## 2019-01-06 DIAGNOSIS — E785 Hyperlipidemia, unspecified: Secondary | ICD-10-CM | POA: Diagnosis not present

## 2019-01-06 DIAGNOSIS — Z79899 Other long term (current) drug therapy: Secondary | ICD-10-CM

## 2019-01-06 DIAGNOSIS — E559 Vitamin D deficiency, unspecified: Secondary | ICD-10-CM

## 2019-01-06 DIAGNOSIS — I10 Essential (primary) hypertension: Secondary | ICD-10-CM | POA: Diagnosis not present

## 2019-01-06 DIAGNOSIS — D649 Anemia, unspecified: Secondary | ICD-10-CM

## 2019-01-06 DIAGNOSIS — Z794 Long term (current) use of insulin: Secondary | ICD-10-CM

## 2019-01-06 DIAGNOSIS — E119 Type 2 diabetes mellitus without complications: Secondary | ICD-10-CM | POA: Diagnosis not present

## 2019-01-06 DIAGNOSIS — E269 Hyperaldosteronism, unspecified: Secondary | ICD-10-CM | POA: Diagnosis not present

## 2019-01-06 DIAGNOSIS — IMO0001 Reserved for inherently not codable concepts without codable children: Secondary | ICD-10-CM

## 2019-01-06 DIAGNOSIS — E1169 Type 2 diabetes mellitus with other specified complication: Secondary | ICD-10-CM | POA: Diagnosis not present

## 2019-01-06 MED ORDER — OZEMPIC (1 MG/DOSE) 2 MG/1.5ML ~~LOC~~ SOPN: 1.0000 mg | PEN_INJECTOR | SUBCUTANEOUS | 3 refills | Status: DC

## 2019-01-06 MED ORDER — TRESIBA 100 UNIT/ML ~~LOC~~ SOLN: 10.0000 [IU] | Freq: Every day | SUBCUTANEOUS | 3 refills | Status: AC

## 2019-01-06 MED ORDER — FREESTYLE LIBRE SENSOR SYSTEM MISC: 0 refills | Status: DC

## 2019-01-06 NOTE — Progress Notes (Signed)
FOLLOW UP VISIT  Assessment and Plan:  Essential hypertension - continue medications, DASH diet, exercise and monitor at home. Call if greater than 130/80.  -     CBC with Differential/Platelet -     Hepatic function panel -     TSH -     BASIC METABOLIC PANEL WITH GFR  Diabetes mellitus with hyperlipemia ([place]) Discussed general issues about diabetes pathophysiology and management., Educational material distributed., Suggested low cholesterol diet., Encouraged aerobic exercise., Discussed foot care., Reminded to get yearly retinal exam. 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  -     Lipid panel  Anemia, unspecified type - monitor, continue iron supp with Vitamin C and increase green leafy veggies  Discussed med's effects and SE's. Screening labs and tests as requested with regular follow-up as recommended. Future Appointments  Date Time Provider [place]  01/06/2019  4:00 PM Rich Fuchs [facility] None    HPI 53 y.o. female presents for elevated sugars after being lost to follow up, last seen 05/2017.  She states she started to have headaches again but she has not had any DM poly's, some blurry vision so she called to make an appointment. Her sugars have been running 300-400's in the AM.  The lowest was 266. Started metformin on Sunday.  She has been working on diet and exercise for diabetes  With hyperlipidemia Has nueuropathy has checked sugars and they were in the 400s, metformin was sent in.  she is on bASA she is not on ACE/ARB she was following with Dr. Darnell Level. But she has not been back denies foot ulcerations, nausea,  paresthesia of the feet and visual disturbances.  Last A1C in the office was:  Lab Results  Component Value Date   HGBA1C 8.1 (H) 05/17/2017    Has possible history of aldosteronism, follows with Dr. Darnell Level for this and DM, on spirolactone 61m BID and tolerates well.  Lab Results  Component Value Date   GFRAA 95 05/17/2017    Her blood pressure has been controlled at home, today their BP is BP: 128/72 She does workout, she has been walking. She denies chest pain, shortness of breath, dizziness.  BMI is Body mass index is 30 kg/m., she is working on diet and exercise. Her mom has stage 3 lung cancer, [place] is 89 [place] she has to travel back and forth, her aunt is there and helps with care. She states her divorce is final but there is still unresolved issues with property.  Wt Readings from Last 3 Encounters:  01/06/19 194 lb 6.4 oz (88.2 kg)  05/17/17 184 lb 3.2 oz (83.6 kg)  02/19/17 183 lb 6.4 oz (83.2 kg)   She is not on cholesterol medication and denies myalgias. Her cholesterol is not at goal, LDL of 70. The cholesterol last visit was:   Lab Results  Component Value Date   CHOL 193 02/19/2017   HDL 71 02/19/2017   LDLCALC 105 (H) 02/19/2017   TRIG 80 02/19/2017   CHOLHDL 2.7 02/19/2017   Patient is on Vitamin D supplement, 1000 daily. Lab Results  Component Value Date   VD25OH 38 02/19/2017     Current Medications:  Current Outpatient Medications on File Prior to Visit  Medication Sig Dispense Refill  . aspirin 81 MG EC tablet Take 81 mg by mouth every morning.  0  . Blood Glucose Monitoring Suppl (ONE TOUCH ULTRA MINI) w/Device KIT See admin instructions. for testing  0  . Cyanocobalamin (B-12 PO) Take 1 tablet by mouth daily. Reported on 12/14/2015    . Iron TABS Take 1 tablet by mouth daily. Reported on 09/08/2015    . Magnesium 250 MG TABS Take by mouth.    . metFORMIN (GLUCOPHAGE XR) 500 MG 24 hr tablet Take 2 tablets 2 x day for Diabetes 360 tablet 99  . omeprazole  (PRILOSEC) 40 MG capsule Take 1 capsule (40 mg total) by mouth daily. 90 capsule 3  . ONE TOUCH ULTRA TEST test strip 1 each by Other route 2 (two) times daily. for testing 100 each 0  . spironolactone (ALDACTONE) 50 MG tablet Take 100 mg in am and 50 mg in pm 270 tablet 3  . Vitamin D, Ergocalciferol, (DRISDOL) 50000 units CAPS capsule TAKE ONE CAPSULE BY MOUTH EVERY 7 DAYS 12 capsule 7   No current facility-administered medications on file prior to visit.    Medical History:  Past Medical History:  Diagnosis Date  . Adrenal adenoma   . Allergy   . Anemia   . Diabetes mellitus without complication ([place])   . Endometriosis of rectovaginal septum   . GERD (gastroesophageal reflux disease)   . Headache    MIGRAINES  . Hyperaldosteronism ([place])    Possibly  . Hyperlipidemia   . Hypertension   . Hypokalemia   . Ovarian cyst    Allergies Allergies  Allergen Reactions  . Ace Inhibitors Cough  . Hctz [Hydrochlorothiazide] Cough   Surgical History: reviewed and unchanged Family History: reviewed and unchanged Social History: reviewed and unchanged  Physical Exam: Estimated body mass index is 30 kg/m as calculated from the following:   Height as of this encounter: 5' 7.5" (1.715 m).   Weight as of this encounter: 194 lb 6.4 oz (88.2 kg). BP 128/72   Pulse 68   Temp 97.6 F (36.4 C)   Ht 5' 7.5" (1.715 m)   Wt 194 lb 6.4 oz (88.2 kg)   LMP 05/16/2014   SpO2 97%   BMI 30.00 kg/m  General Appearance: Well nourished, in no apparent distress. Eyes: PERRLA, EOMs, conjunctiva no swelling or erythema, normal fundi and vessels. Sinuses: No Frontal/maxillary tenderness ENT/Mouth: Ext aud canals clear, normal light reflex with TMs without erythema, bulging.  Good dentition. No erythema, swelling, or exudate on post pharynx. Tonsils not swollen or erythematous. Hearing normal.  Neck: Supple, thyroid normal. No bruits Respiratory: Respiratory effort normal, BS equal bilaterally without  rales, rhonchi, wheezing or stridor. Cardio: RRR without murmurs, rubs or gallops. Brisk peripheral pulses without edema.  Chest: symmetric, with normal excursions and percussion. Breasts: defer Abdomen: Soft, +BS. Non tender, no guarding, rebound, hernias, masses, or organomegaly. .  Lymphatics: Non tender without lymphadenopathy.  Genitourinary: defer Musculoskeletal: Full ROM all peripheral extremities,5/5 strength, and normal gait. Skin: Warm, dry without rashes, lesions, ecchymosis.  Neuro: Cranial nerves intact, reflexes equal bilaterally. Normal muscle tone, no cerebellar symptoms. Sensation intact.  Psych: Awake and oriented X 3, normal affect, Insight and Judgment appropriate.    Vicie Mutters 3:57 PM

## 2019-01-06 NOTE — Patient Instructions (Addendum)
Continue the metformin Build up to 4 pills a day as tolerated with nausea and diarrhea  Start ozempic injection as shown once a week. Initial: 0.25 mg once weekly for 2-4 weeks, then increase to 0.5 mg once weekly; may increase to 1 mg once weekly after an additional 4 weeks if needed to achieve glycemic goals  .You may inject in the stomach, thigh or arm. You may experience nausea in the first few days which usually goes away.   You will feel fullness of the stomach with starting the medication and should try to keep the portions at meals small.      If any questions or concerns are present call the office  Please check blood sugars at least half the time about 2 hours after any meal and 3 times per week on waking up. Please bring blood sugar monitor to each visit. Recommended blood sugar levels about 2 hours after meal is 140-180 and on waking up 90-130  We are starting you out on a Basal insulin. This insulin ONLY affects your morning insulin. We will start you out on 10 units of insulin. Then after 2 days go up to 14 untis   This insulin provides blood sugar control for up to 24 hours.  Start with 10 units at bedtime daily, then go up to 14 units after 2 days, and THEN increase by 2 units every 3 days until the waking up sugars are under 150. Then continue the same dose.   If blood sugar is under 90 for 2 days in a row, reduce the dose by 2 units. Note that this insulin does not control the rise of blood sugar with meals    Please remember only take the insulin WITH food, if you are sick or unable to eat DO NOT take your insulin. Also a low blood sugar is much more dangerous than a high blood sugar. Your brain needs 2 things, oxygen and sugar, so lets make sure it gets both. If at any time you have a question or concern, call the office or message [place] in [place].    Your A1C is a measure of your sugar over the past 3 months and is not affected by what you have eaten over the past few days.  Diabetes increases your chances of stroke and heart attack over 300 % and is the leading cause of blindness and kidney failure in the [place]. Please make sure you decrease bad carbs like white bread, white rice, potatoes, corn, soft drinks, pasta, cereals, refined sugars, sweet tea, dried fruits, and fruit juice. Good carbs are okay to eat in moderation like sweet potatoes, brown rice, whole grain pasta/bread, most fruit (except dried fruit) and you can eat as many veggies as you want.   Greater than 6.5 is considered diabetic. Between 6.4 and 5.7 is prediabetic If your A1C is less than 5.7 you are NOT diabetic.  Targets for Glucose Readings: Time of Check Target for patients WITHOUT Diabetes Target for DIABETICS  Before Meals Less than 100  less than 150  Two hours after meals Less than 200  Less than 250    If your morning sugar is always below 120 but your A1C is still elevated then the issue is with your sugar spiking after meals. Try to take your blood sugar approximately 2 hours after eating, this number should be less than 200. If it is not, think about the foods that you ate and better choices you can make.  Bad carbs also include fruit juice, alcohol, and sweet tea. These are empty calories that do not signal to your brain that you are full.   Please remember the good carbs are still carbs which convert into sugar. So please measure them out no more than 1/2-1 cup of rice, oatmeal, pasta, and beans  Veggies are however free foods! Pile them on.   Not all fruit is created equal. Please see the list below, the fruit at the bottom is higher in sugars than the fruit at the top. Please avoid all dried fruits.     Diabetes is a very complicated disease...lets simplify it.  An easy way to look at it to understand the complications is if you think of the extra sugar floating in your blood stream as glass shards floating through your blood stream.    Diabetes affects your  small vessels first: 1) The glass shards (sugar) scraps down the tiny blood vessels in your eyes and lead to diabetic retinopathy, the leading cause of blindness in the [place]. Diabetes is the leading cause of newly diagnosed adult (15 to 54 years of age) blindness in the [place].  2) The glass shards scratches down the tiny vessels of your legs leading to nerve damage called neuropathy and can lead to amputations of your feet. More than 60% of all non-traumatic amputations of lower limbs occur in people with diabetes.  3) Over time the small vessels in your brain are shredded and closed off, individually this does not cause any problems but over a long period of time many of the small vessels being blocked can lead to Vascular Dementia.   4) Your kidney's are a filter system and have a "net" that keeps certain things in the body and lets bad things out. Sugar shreds this net and leads to kidney damage and eventually failure. Decreasing the sugar that is destroying the net and certain blood pressure medications can help stop or decrease progression of kidney disease. Diabetes was the primary cause of kidney failure in 44 percent of all new cases in 2011.  5) Diabetes also destroys the small vessels in your penis that lead to erectile dysfunction. Eventually the vessels are so damaged that you may not be responsive to cialis or viagra.   Diabetes and your large vessels: Your larger vessels consist of your coronary arteries in your heart and the carotid vessels to your brain. Diabetes or even increased sugars put you at 300% increased risk of heart attack and stroke and this is why.. The sugar scrapes down your large blood vessels and your body sees this as an internal injury and tries to repair itself. Just like you get a scab on your skin, your platelets will stick to the blood vessel wall trying to heal it. This is why we have diabetics on low dose aspirin daily, this prevents the platelets from  sticking and can prevent plaque formation. In addition, your body takes cholesterol and tries to shove it into the open wound. This is why we want your LDL, or bad cholesterol, below 70.   The combination of platelets and cholesterol over 5-10 years forms plaque that can break off and cause a heart attack or stroke.   PLEASE REMEMBER:  Diabetes is preventable! Up to 31 percent of complications and morbidities among individuals with type 2 diabetes can be prevented, delayed, or effectively treated and minimized with regular visits to a health professional, appropriate monitoring and medication, and a healthy diet and  lifestyle.

## 2019-01-07 ENCOUNTER — Other Ambulatory Visit: Payer: Self-pay | Admitting: Physician Assistant

## 2019-01-07 ENCOUNTER — Other Ambulatory Visit: Payer: Self-pay | Admitting: Adult Health

## 2019-01-07 LAB — LIPID PANEL
Cholesterol: 242 mg/dL — ABNORMAL HIGH (ref <200)
HDL: 62 mg/dL (ref >=50)
LDL Cholesterol (Calc): 158 mg/dL (calc) — ABNORMAL HIGH
Non-HDL Cholesterol (Calc): 180 mg/dL (calc) — ABNORMAL HIGH (ref <130)
Total CHOL/HDL Ratio: 3.9 (calc) (ref <5.0)
Triglycerides: 102 mg/dL (ref <150)

## 2019-01-07 LAB — COMPLETE METABOLIC PANEL WITH GFR
AG Ratio: 1.6 (calc) (ref 1.0–2.5)
ALT: 22 U/L (ref 6–29)
AST: 16 U/L (ref 10–35)
Albumin: 4.9 g/dL (ref 3.6–5.1)
Alkaline phosphatase (APISO): 87 U/L (ref 37–153)
BUN: 10 mg/dL (ref 7–25)
CO2: 23 mmol/L (ref 20–32)
Calcium: 10.2 mg/dL (ref 8.6–10.4)
Chloride: 100 mmol/L (ref 98–110)
Creat: 0.79 mg/dL (ref 0.50–1.05)
GFR, Est African American: 99 mL/min/{1.73_m2} (ref >=60)
GFR, Est Non African American: 85 mL/min/{1.73_m2} (ref >=60)
Globulin: 3 g/dL (calc) (ref 1.9–3.7)
Glucose, Bld: 271 mg/dL — ABNORMAL HIGH (ref 65–99)
Potassium: 4.4 mmol/L (ref 3.5–5.3)
Sodium: 135 mmol/L (ref 135–146)
Total Bilirubin: 0.6 mg/dL (ref 0.2–1.2)
Total Protein: 7.9 g/dL (ref 6.1–8.1)

## 2019-01-07 LAB — CBC WITH DIFFERENTIAL/PLATELET
Absolute Monocytes: 392 cells/uL (ref 200–950)
Basophils Absolute: 28 cells/uL (ref 0–200)
Basophils Relative: 0.4 %
Eosinophils Absolute: 42 cells/uL (ref 15–500)
Eosinophils Relative: 0.6 %
HCT: 41.6 % (ref 35.0–45.0)
Hemoglobin: 13.9 g/dL (ref 11.7–15.5)
Lymphs Abs: 2275 cells/uL (ref 850–3900)
MCH: 28 pg (ref 27.0–33.0)
MCHC: 33.4 g/dL (ref 32.0–36.0)
MCV: 83.7 fL (ref 80.0–100.0)
MPV: 11.4 fL (ref 7.5–12.5)
Monocytes Relative: 5.6 %
Neutro Abs: 4263 cells/uL (ref 1500–7800)
Neutrophils Relative %: 60.9 %
Platelets: 201 10*3/uL (ref 140–400)
RBC: 4.97 10*6/uL (ref 3.80–5.10)
RDW: 12.6 % (ref 11.0–15.0)
Total Lymphocyte: 32.5 %
WBC: 7 10*3/uL (ref 3.8–10.8)

## 2019-01-07 LAB — MAGNESIUM: Magnesium: 1.8 mg/dL (ref 1.5–2.5)

## 2019-01-07 LAB — HEMOGLOBIN A1C: Hgb A1c MFr Bld: >14 % of total Hgb — ABNORMAL HIGH (ref <5.7)

## 2019-01-07 LAB — VITAMIN D 25 HYDROXY (VIT D DEFICIENCY, FRACTURES): Vit D, 25-Hydroxy: 54 ng/mL (ref 30–100)

## 2019-01-07 LAB — TSH: TSH: 1.4 mIU/L

## 2019-01-07 MED ORDER — FREESTYLE LIBRE 14 DAY READER DEVI: 2.0000 "application " | 2 refills | Status: DC

## 2019-01-07 MED ORDER — ROSUVASTATIN CALCIUM 5 MG PO TABS: 5.0000 mg | ORAL_TABLET | Freq: Every day | ORAL | 1 refills | Status: DC

## 2019-01-08 ENCOUNTER — Other Ambulatory Visit: Payer: Self-pay | Admitting: Physician Assistant

## 2019-01-08 MED ORDER — DEXCOM G6 TRANSMITTER MISC: 1.0000 | 0 refills | Status: DC

## 2019-01-08 MED ORDER — DEXCOM G6 RECEIVER DEVI: 1.0000 | Freq: Once | 0 refills | Status: DC

## 2019-01-08 MED ORDER — DEXCOM G6 SENSOR MISC: 1.0000 | 3 refills | Status: DC

## 2019-01-08 NOTE — Telephone Encounter (Signed)
No I have not seen anything about this subject yet

## 2019-01-20 NOTE — Progress Notes (Signed)
THIS ENCOUNTER IS A VIRTUAL VISIT DUE TO COVID-19 - PATIENT WAS NOT SEEN IN THE OFFICE.  PATIENT HAS CONSENTED TO VIRTUAL VISIT / TELEMEDICINE VISIT   Virtual Visit via telephone Note  I connected with Kristin Hess on 01/22/2019 by telephone.  I verified that I am speaking with the correct person using two identifiers.    I discussed the limitations of evaluation and management by telemedicine and the availability of in person appointments. The patient expressed understanding and agreed to proceed.  History of Present Illness: [name] presents for 2 week telephone visit for her sugars.  She was given the dexcom and metformin and basal insulin last visit, she did not start the ozempic.  Her sugars have been lowest is 124 in the morning and highest was 201.  She started on 14 units, never went up.  She has been out of her needles x 4 days so she has not had any insulin.  After meals has been 180 to 220.  She has been having tooth abscess x Sunday, she has not been able to take the metformin due to this. On ABX.   BP Readings from Last 3 Encounters:  01/06/19 128/72  05/17/17 136/80  02/19/17 124/82    Medications  Current Outpatient Medications (Endocrine & Metabolic):  .  metFORMIN (GLUCOPHAGE XR) 500 MG 24 hr tablet, Take 2 tablets 2 x day for Diabetes .  Semaglutide, 1 MG/DOSE, (OZEMPIC, 1 MG/DOSE,) 2 MG/1.5ML SOPN, Inject 1 mg into the skin once a week.  Current Outpatient Medications (Cardiovascular):  .  rosuvastatin (CRESTOR) 5 MG tablet, Take 1 tablet (5 mg total) by mouth at bedtime. Marland Kitchen  spironolactone (ALDACTONE) 50 MG tablet, Take 100 mg in am and 50 mg in pm   Current Outpatient Medications (Analgesics):  .  aspirin 81 MG EC tablet, Take 81 mg by mouth every morning.  Current Outpatient Medications (Hematological):  Marland Kitchen  Cyanocobalamin (B-12 PO), Take 1 tablet by mouth daily. Reported on 12/14/2015 .  Iron TABS, Take 1 tablet by mouth daily. Reported on  09/08/2015  Current Outpatient Medications (Other):  .  Blood Glucose Monitoring Suppl (ONE TOUCH ULTRA MINI) w/Device KIT, See admin instructions. for testing .  Continuous Blood Gluc Sensor (DEXCOM G6 SENSOR) MISC, 1 Device by Does not apply route continuous. .  Continuous Blood Gluc Transmit (DEXCOM G6 TRANSMITTER) MISC, 1 Device by Does not apply route continuous. .  Magnesium 250 MG TABS, Take by mouth. Marland Kitchen  omeprazole (PRILOSEC) 40 MG capsule, Take 1 capsule (40 mg total) by mouth daily. .  ONE TOUCH ULTRA TEST test strip, 1 each by Other route 2 (two) times daily. for testing .  Vitamin D, Ergocalciferol, (DRISDOL) 50000 units CAPS capsule, TAKE ONE CAPSULE BY MOUTH EVERY 7 DAYS  Problem list She has Contraception management - Mirena inserted 11/2007 - plans another Mirena; Hypertension; Type 2 diabetes mellitus with hyperlipidemia (Kristin Hess); Hyperlipidemia; Allergy; Anemia; Hypokalemia; Bilateral ovarian cysts; Endometriosis of rectovaginal septum; 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Kristin Hess); and TIA (transient ischemic attack) on their problem list.   Observations/Objective: General Appearance:Well sounding, in no apparent distress.  ENT/Mouth: No hoarseness, No cough for duration of visit.  Respiratory: completing full sentences without distress, without audible wheeze Neuro: Awake and oriented X 3,  Psych:  Insight and Judgment appropriate.    Assessment and Plan:  Type 2 diabetes mellitus with hyperlipidemia ([facility]) -     NOVOFINE 32G X 6 MM MISC; Needs daily for insulin -  Insulin Detemir (LEVEMIR) 100 UNIT/ML Pen; INJECT 10-20 UNITS DEPENDING ON MORNING SUGAR ONCE DAILY SUBCUTANEOUSLY Follow up 09/03  Essential hypertension - continue medications, DASH diet, exercise and monitor at home. Call if greater than 130/80.      Future Appointments  Date Time Provider Kristin [place]  02/06/2019  2:30 PM Vicie Mutters, PA-C [id number] None    Follow Up Instructions:  I discussed the assessment and  treatment plan with the patient. The patient was provided an opportunity to ask questions and all were answered. The patient agreed with the plan and demonstrated an understanding of the instructions.   The patient was advised to call back or seek an in-person evaluation if the symptoms worsen or if the condition fails to improve as anticipated.  I provided 30 minutes of non-face-to-face time during this encounter.   Vicie Mutters, PA-C

## 2019-01-22 ENCOUNTER — Other Ambulatory Visit: Payer: Self-pay

## 2019-01-22 ENCOUNTER — Ambulatory Visit (INDEPENDENT_AMBULATORY_CARE_PROVIDER_SITE_OTHER): Payer: 59 | Admitting: Physician Assistant

## 2019-01-22 ENCOUNTER — Encounter: Payer: Self-pay | Admitting: Physician Assistant

## 2019-01-22 VITALS — BP 131/98

## 2019-01-22 DIAGNOSIS — E785 Hyperlipidemia, unspecified: Secondary | ICD-10-CM | POA: Diagnosis not present

## 2019-01-22 DIAGNOSIS — E1169 Type 2 diabetes mellitus with other specified complication: Secondary | ICD-10-CM | POA: Diagnosis not present

## 2019-01-22 DIAGNOSIS — I10 Essential (primary) hypertension: Secondary | ICD-10-CM | POA: Diagnosis not present

## 2019-01-22 MED ORDER — NOVOFINE 32G X 6 MM MISC: 5 refills | Status: DC

## 2019-01-22 MED ORDER — INSULIN DETEMIR 100 UNIT/ML FLEXPEN: PEN_INJECTOR | SUBCUTANEOUS | 11 refills | Status: DC

## 2019-01-22 NOTE — Patient Instructions (Addendum)
This insulin provides blood sugar control for up to 24 hours.  Start with 10 units at bedtime daily, then go up to 14 units after 2 days, and THEN increase by 2 units every 3 days until the waking up sugars are under 150. Then continue the same dose.   If blood sugar is under 90 for 2 days in a row, reduce the dose by 2 units. Note that this insulin does not control the rise of blood sugar with meals     Targets for Glucose Readings: Time of Check Target for patients WITHOUT Diabetes Target for DIABETICS  Before Meals Less than 100  less than 150  Two hours after meals Less than 200  Less than 250    RANGE OF A1C   Your A1C is a measure of your sugar over the past 3 months and is not affected by what you have eaten over the past few days. Diabetes increases your chances of stroke and heart attack over 300 % and is the leading cause of blindness and kidney failure in the [place]. Please make sure you decrease bad carbs like white bread, white rice, potatoes, corn, soft drinks, pasta, cereals, refined sugars, sweet tea, dried fruits, and fruit juice. Good carbs are okay to eat in moderation like sweet potatoes, brown rice, whole grain pasta/bread, most fruit (except dried fruit) and you can eat as many veggies as you want.

## 2019-02-03 NOTE — Progress Notes (Signed)
History of Present Illness: [name] presents for 2 week  visit for her sugars.  She was given the dexcom and metformin 3pills tolerated and basal insulin last visit, she did not start the ozempic.  She is on basal insulin 19 units, in the morning it has been 131 is the lowest and highest is 212.    She had a tooth abscess, treated with amoxicillin and saw her dentist. She now has a yeast infection.   On Monday/tuesday she felt a raised area on her right breast that is tender and sore, feels superficial to patient. No nipple changes or discharge.   BP Readings from Last 3 Encounters:  01/22/19 (!) 131/98  01/06/19 128/72  05/17/17 136/80   BMI is Body mass index is 30.21 kg/m., she is working on diet and exercise. Wt Readings from Last 3 Encounters:  02/06/19 195 lb 12.8 oz (88.8 kg)  01/06/19 194 lb 6.4 oz (88.2 kg)  05/17/17 184 lb 3.2 oz (83.6 kg)   Lab Results  Component Value Date   HGBA1C >14.0 (H) 01/06/2019     Medications  Current Outpatient Medications (Endocrine & Metabolic):  Marland Kitchen  Insulin Detemir (LEVEMIR) 100 UNIT/ML Pen, INJECT 10-20 UNITS DEPENDING ON MORNING SUGAR ONCE DAILY SUBCUTANEOUSLY .  metFORMIN (GLUCOPHAGE XR) 500 MG 24 hr tablet, Take 2 tablets 2 x day for Diabetes .  Semaglutide, 1 MG/DOSE, (OZEMPIC, 1 MG/DOSE,) 2 MG/1.5ML SOPN, Inject 1 mg into the skin once a week.  Current Outpatient Medications (Cardiovascular):  .  rosuvastatin (CRESTOR) 5 MG tablet, Take 1 tablet (5 mg total) by mouth at bedtime. Marland Kitchen  spironolactone (ALDACTONE) 50 MG tablet, Take 100 mg in am and 50 mg in pm   Current Outpatient Medications (Analgesics):  .  aspirin 81 MG EC tablet, Take 81 mg by mouth every morning.  Current Outpatient Medications (Hematological):  Marland Kitchen  Cyanocobalamin (B-12 PO), Take 1 tablet by mouth daily. Reported on 12/14/2015 .  Iron TABS, Take 1 tablet by mouth daily. Reported on 09/08/2015  Current Outpatient Medications (Other):  .  Blood Glucose  Monitoring Suppl (ONE TOUCH ULTRA MINI) w/Device KIT, See admin instructions. for testing .  Continuous Blood Gluc Sensor (DEXCOM G6 SENSOR) MISC, 1 Device by Does not apply route continuous. .  Continuous Blood Gluc Transmit (DEXCOM G6 TRANSMITTER) MISC, 1 Device by Does not apply route continuous. .  Magnesium 250 MG TABS, Take by mouth. Marland Kitchen  NOVOFINE 32G X 6 MM MISC, Needs daily for insulin .  omeprazole (PRILOSEC) 40 MG capsule, Take 1 capsule (40 mg total) by mouth daily. .  ONE TOUCH ULTRA TEST test strip, 1 each by Other route 2 (two) times daily. for testing .  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   Observations/Objective: Physical Exam Chest:     Chest wall: Mass present.     Breasts:        Right: Mass present. No inverted nipple, nipple discharge or skin change.        Left: No inverted nipple, nipple discharge or skin change.    Lymphadenopathy:     Upper Body:     Right upper body: No  axillary or pectoral adenopathy.     Left upper body: No axillary or pectoral adenopathy.      Assessment and Plan: Kristin Hess was seen today for follow-up and breast mass.  Diagnoses and all orders for this visit:  Type 2 diabetes mellitus with hyperlipidemia ([place]) Add on ozempic, continue to keep blood sugar log Follow up 1 month  Breast mass, right -     MM DIAG BREAST TOMO UNI RIGHT; Future -     US BREAST LTD UNI RIGHT INC AXILLA; Future  Other orders -     fluconazole (DIFLUCAN) 150 MG tablet; Take 1 tablet (150 mg total) by mouth daily. -     Continuous Blood Gluc Sensor  (DEXCOM G6 SENSOR) MISC; 1 Device by Does not apply route continuous. -     Continuous Blood Gluc Transmit (DEXCOM G6 TRANSMITTER) MISC; 1 Device by Does not apply route continuous.        Future Appointments  Date Time Provider [place]  02/06/2019  2:30 PM Vicie Mutters, PA-C [facility] None    Follow Up Instructions:  I discussed the assessment and treatment plan with the patient. The patient was provided an opportunity to ask questions and all were answered. The patient agreed with the plan and demonstrated an understanding of the instructions.   The patient was advised to call back or seek an in-person evaluation if the symptoms worsen or if the condition fails to improve as anticipated.  I provided 30 minutes of non-face-to-face time during this encounter.   Vicie Mutters, PA-C

## 2019-02-06 ENCOUNTER — Ambulatory Visit (INDEPENDENT_AMBULATORY_CARE_PROVIDER_SITE_OTHER): Payer: 59 | Admitting: Physician Assistant

## 2019-02-06 ENCOUNTER — Other Ambulatory Visit: Payer: Self-pay

## 2019-02-06 ENCOUNTER — Encounter: Payer: Self-pay | Admitting: Physician Assistant

## 2019-02-06 VITALS — BP 130/70 | HR 60 | Temp 97.5°F | Wt 195.8 lb

## 2019-02-06 DIAGNOSIS — E785 Hyperlipidemia, unspecified: Secondary | ICD-10-CM | POA: Diagnosis not present

## 2019-02-06 DIAGNOSIS — E1169 Type 2 diabetes mellitus with other specified complication: Secondary | ICD-10-CM | POA: Diagnosis not present

## 2019-02-06 DIAGNOSIS — N6310 Unspecified lump in the right breast, unspecified quadrant: Secondary | ICD-10-CM | POA: Diagnosis not present

## 2019-02-06 MED ORDER — FLUCONAZOLE 150 MG PO TABS: 150.0000 mg | ORAL_TABLET | Freq: Every day | ORAL | 3 refills | Status: DC

## 2019-02-06 MED ORDER — DEXCOM G6 TRANSMITTER MISC: 1.0000 | 0 refills | Status: DC

## 2019-02-06 MED ORDER — DEXCOM G6 SENSOR MISC: 1.0000 | 3 refills | Status: DC

## 2019-02-06 NOTE — Patient Instructions (Addendum)
Will send in diflucan for your yeast infection  I would suggest adding on the ozempic once a week for weight loss and not have to do as much basal insulin  Continue to increase basal insulin until your achieve the morning sugar of under 120 consistently.  Increase 2 units every 3 days until you reach your target.   b Continue to work on diet.   What does your A1C results mean?  Your A1C is a measure of your sugar over the past 3 months   Use this chart as a guide to compare the results of your A1C blood test to your estimated average daily blood sugar:  A1C Range Average Sugar  4.0-6.0% 60-120 mg/dl  6.1-7.0% 121 - 150 mg/dl  7.1-8.0% 151-180 mg/dl  8.1-9.0% 181-210 mg/dl  10.1-11% 211-240 mg/dl  11.1-12.0% 271-300 mg/dl  12.1-13.0% 301-330 mg/dl  13.1-14.0% 331-360 mg/dl  Greater than 14.0% Greater than 360 mg/dl       Bad carbs also include fruit juice, alcohol, and sweet tea. These are empty calories that do not signal to your brain that you are full.   Please remember the good carbs are still carbs which convert into sugar. So please measure them out no more than 1/2-1 cup of rice, oatmeal, pasta, and beans  Veggies are however free foods! Pile them on.   Not all fruit is created equal. Please see the list below, the fruit at the bottom is higher in sugars than the fruit at the top. Please avoid all dried fruits.

## 2019-02-17 ENCOUNTER — Ambulatory Visit
Admission: RE | Admit: 2019-02-17 | Discharge: 2019-02-17 | Disposition: A | Discharge status: Home / Self Care | Payer: 59 | Source: Ambulatory Visit | Attending: Physician Assistant | Admitting: Physician Assistant

## 2019-02-17 ENCOUNTER — Other Ambulatory Visit: Payer: Self-pay

## 2019-02-17 DIAGNOSIS — N6489 Other specified disorders of breast: Secondary | ICD-10-CM | POA: Diagnosis not present

## 2019-02-17 DIAGNOSIS — N6310 Unspecified lump in the right breast, unspecified quadrant: Secondary | ICD-10-CM

## 2019-02-17 DIAGNOSIS — R928 Other abnormal and inconclusive findings on diagnostic imaging of breast: Secondary | ICD-10-CM | POA: Diagnosis not present

## 2019-02-26 ENCOUNTER — Encounter: Payer: Self-pay | Admitting: Physician Assistant

## 2019-03-06 NOTE — Progress Notes (Deleted)
History of Present Illness: [name] presents for 4 week  visit for her sugars.   She was started on ozempic last visit, on metformin 3 pills a day and basal insulin.  She has the dexcom to monitor her sugars for now.  She is on basal insulin 19 units, in the morning it has been 131 is the lowest and highest is 212.    BP Readings from Last 3 Encounters:  02/06/19 130/70  01/22/19 (!) 131/98  01/06/19 128/72   BMI is There is no height or weight on file to calculate BMI., she is working on diet and exercise. Wt Readings from Last 3 Encounters:  02/06/19 195 lb 12.8 oz (88.8 kg)  01/06/19 194 lb 6.4 oz (88.2 kg)  05/17/17 184 lb 3.2 oz (83.6 kg)   Lab Results  Component Value Date   HGBA1C >14.0 (H) 01/06/2019     Medications  Current Outpatient Medications (Endocrine & Metabolic):  Marland Kitchen  Insulin Detemir (LEVEMIR) 100 UNIT/ML Pen, INJECT 10-20 UNITS DEPENDING ON MORNING SUGAR ONCE DAILY SUBCUTANEOUSLY .  metFORMIN (GLUCOPHAGE XR) 500 MG 24 hr tablet, Take 2 tablets 2 x day for Diabetes .  Semaglutide, 1 MG/DOSE, (OZEMPIC, 1 MG/DOSE,) 2 MG/1.5ML SOPN, Inject 1 mg into the skin once a week.  Current Outpatient Medications (Cardiovascular):  .  rosuvastatin (CRESTOR) 5 MG tablet, Take 1 tablet (5 mg total) by mouth at bedtime. Marland Kitchen  spironolactone (ALDACTONE) 50 MG tablet, Take 100 mg in am and 50 mg in pm   Current Outpatient Medications (Analgesics):  .  aspirin 81 MG EC tablet, Take 81 mg by mouth every morning.  Current Outpatient Medications (Hematological):  Marland Kitchen  Cyanocobalamin (B-12 PO), Take 1 tablet by mouth daily. Reported on 12/14/2015 .  Iron TABS, Take 1 tablet by mouth daily. Reported on 09/08/2015  Current Outpatient Medications (Other):  .  Blood Glucose Monitoring Suppl (ONE TOUCH ULTRA MINI) w/Device KIT, See admin instructions. for testing .  Continuous Blood Gluc Sensor (DEXCOM G6 SENSOR) MISC, 1 Device by Does not apply route continuous. .  Continuous Blood  Gluc Transmit (DEXCOM G6 TRANSMITTER) MISC, 1 Device by Does not apply route continuous. .  fluconazole (DIFLUCAN) 150 MG tablet, Take 1 tablet (150 mg total) by mouth daily. .  Magnesium 250 MG TABS, Take by mouth. Marland Kitchen  NOVOFINE 32G X 6 MM MISC, Needs daily for insulin .  omeprazole (PRILOSEC) 40 MG capsule, Take 1 capsule (40 mg total) by mouth daily. .  ONE TOUCH ULTRA TEST test strip, 1 each by Other route 2 (two) times daily. for testing .  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   Observations/Objective: Physical Exam Constitutional:      Appearance: She is well-developed.  HENT:     Head: Normocephalic and atraumatic.     Right Ear: External ear normal.     Left Ear: External ear normal.  Eyes:     Conjunctiva/sclera: Conjunctivae normal.     Pupils: Pupils are equal, round, and reactive to light.  Neck:     Musculoskeletal: Normal range of motion and neck supple.     Thyroid: No thyromegaly.  Cardiovascular:  Rate and Rhythm: Normal rate and regular rhythm.     Heart sounds: Normal heart sounds. No murmur. No friction rub. No gallop.   Pulmonary:     Effort: Pulmonary effort is normal. No respiratory distress.     Breath sounds: Normal breath sounds. No wheezing.  Abdominal:     General: Bowel sounds are normal. There is no distension.     Palpations: Abdomen is soft. There is no mass.     Tenderness: There is no abdominal tenderness. There is no guarding or rebound.  Musculoskeletal: Normal range of motion.  Lymphadenopathy:     Cervical: No cervical adenopathy.   Skin:    General: Skin is warm and dry.  Neurological:     Mental Status: She is alert and oriented to person, place, and time.     Cranial Nerves: No cranial nerve deficit.     Coordination: Coordination normal.     Deep Tendon Reflexes: Reflexes normal.      Assessment and Plan:   Type 2 diabetes mellitus with hyperlipidemia ([place]) Add on ozempic, continue to keep blood sugar log Follow up 1 month       Future Appointments  Date Time Provider [place]  03/12/2019  8:45 AM Vicie Mutters, PA-C [facility] None  04/14/2019  3:30 PM Vicie Mutters, PA-C [facility] None    Follow Up Instructions:  I discussed the assessment and treatment plan with the patient. The patient was provided an opportunity to ask questions and all were answered. The patient agreed with the plan and demonstrated an understanding of the instructions.   The patient was advised to call back or seek an in-person evaluation if the symptoms worsen or if the condition fails to improve as anticipated.  I provided 30 minutes of non-face-to-face time during this encounter.   Vicie Mutters, PA-C

## 2019-03-12 ENCOUNTER — Ambulatory Visit: Payer: 59 | Admitting: Physician Assistant

## 2019-03-27 DIAGNOSIS — E785 Hyperlipidemia, unspecified: Secondary | ICD-10-CM

## 2019-03-27 DIAGNOSIS — E1169 Type 2 diabetes mellitus with other specified complication: Secondary | ICD-10-CM

## 2019-03-27 MED ORDER — OZEMPIC (0.25 OR 0.5 MG/DOSE) 2 MG/1.5ML ~~LOC~~ SOPN: 0.5000 mg | PEN_INJECTOR | SUBCUTANEOUS | 3 refills | Status: DC

## 2019-04-10 DIAGNOSIS — E119 Type 2 diabetes mellitus without complications: Secondary | ICD-10-CM | POA: Insufficient documentation

## 2019-04-10 NOTE — Progress Notes (Deleted)
FOLLOW UP VISIT  Assessment and Plan:  Essential hypertension - continue medications, DASH diet, exercise and monitor at home. Call if greater than 130/80.  -     CBC with Differential/Platelet -     Hepatic function panel -     TSH -     BASIC METABOLIC PANEL WITH GFR  Diabetes mellitus with hyperlipemia ([place]) Discussed general issues about diabetes pathophysiology and management., Educational material distributed., Suggested low cholesterol diet., Encouraged aerobic exercise., Discussed foot care., Reminded to get yearly retinal exam. 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  -     Lipid panel  Anemia, unspecified type - monitor, continue iron supp with Vitamin C and increase green leafy veggies  Discussed med's effects and SE's. Screening labs and tests as requested with regular follow-up as recommended. Future Appointments  Date Time Provider [place]  04/14/2019  3:30 PM Rich Fuchs [facility] None    HPI 53 y.o. female presents for elevated sugars after being lost to follow up, last seen 05/2017.   She has been working on diet and exercise for diabetes, insulin dependent  With hyperlipidemia she is on crestor, started last OV and she is not at goal Has nueuropathy NO CKD, she is not on ACE/ARB due to cough and spirolactone use- will monitor to see if she needs it added she is on bASA She has a Dexcom She is on metformin Insulin  ozempic she was following with Dr. Darnell Level. But she has not been back Eye Exam: denies foot ulcerations, nausea, paresthesia of the feet and visual disturbances.  Last A1C in the office was:  Lab Results  Component Value Date   HGBA1C >14.0 (H) 01/06/2019   Lab Results  Component Value Date   CHOL 242 (H) 01/06/2019   HDL 62 01/06/2019   LDLCALC 158 (H)  01/06/2019   TRIG 102 01/06/2019   CHOLHDL 3.9 01/06/2019    Has possible history of aldosteronism, follows with Dr. Darnell Level for this and DM, on spirolactone 36m BID and tolerates well.  Lab Results  Component Value Date   GFRAA 99 01/06/2019    Her blood pressure has been controlled at home, today their BP is   She does workout, she has been walking. She denies chest pain, shortness of breath, dizziness.  BMI is There is no height or weight on file to calculate BMI., she is working on diet and exercise.  Her mom has stage 3 lung cancer[place]13 [place], her aunt is there and helps with care.  She states her divorce is final but there is still unresolved issues with property.  Wt Readings from Last 3 Encounters:  02/06/19 195 lb 12.8 oz (88.8 kg)  01/06/19 194 lb 6.4 oz (88.2 kg)  05/17/17 184 lb 3.2 oz (83.6 kg)   Patient is on Vitamin D supplement, 1000 daily. Lab Results  Component Value Date   VD25OH 54 01/06/2019     Current Medications:  Current Outpatient Medications on File Prior to Visit  Medication Sig Dispense Refill  . aspirin 81 MG EC tablet Take 81 mg by mouth every morning.  0  . Blood Glucose Monitoring Suppl (ONE TOUCH ULTRA MINI) w/Device KIT See admin instructions. for testing  0  . Continuous Blood Gluc Sensor (  DEXCOM G6 SENSOR) MISC 1 Device by Does not apply route continuous. 3 each 3  . Continuous Blood Gluc Transmit (DEXCOM G6 TRANSMITTER) MISC 1 Device by Does not apply route continuous. 4 each 0  . Cyanocobalamin (B-12 PO) Take 1 tablet by mouth daily. Reported on 12/14/2015    . fluconazole (DIFLUCAN) 150 MG tablet Take 1 tablet (150 mg total) by mouth daily. 1 tablet 3  . Insulin Detemir (LEVEMIR) 100 UNIT/ML Pen INJECT 10-20 UNITS DEPENDING ON MORNING SUGAR ONCE DAILY SUBCUTANEOUSLY 15 mL 11  . Iron TABS Take 1 tablet by mouth daily. Reported on 09/08/2015    . Magnesium 250 MG TABS Take by mouth.    . metFORMIN  (GLUCOPHAGE XR) 500 MG 24 hr tablet Take 2 tablets 2 x day for Diabetes 360 tablet 99  . NOVOFINE 32G X 6 MM MISC Needs daily for insulin 100 each 5  . omeprazole (PRILOSEC) 40 MG capsule Take 1 capsule (40 mg total) by mouth daily. 90 capsule 3  . ONE TOUCH ULTRA TEST test strip 1 each by Other route 2 (two) times daily. for testing 100 each 0  . rosuvastatin (CRESTOR) 5 MG tablet Take 1 tablet (5 mg total) by mouth at bedtime. 90 tablet 1  . Semaglutide,0.25 or 0.5MG/DOS, (OZEMPIC, 0.25 OR 0.5 MG/DOSE,) 2 MG/1.5ML SOPN Inject 0.5 mg into the skin once a week. 3 pen 3  . spironolactone (ALDACTONE) 50 MG tablet Take 100 mg in am and 50 mg in pm 270 tablet 3  . Vitamin D, Ergocalciferol, (DRISDOL) 50000 units CAPS capsule TAKE ONE CAPSULE BY MOUTH EVERY 7 DAYS 12 capsule 7   No current facility-administered medications on file prior to visit.    Medical History:  Past Medical History:  Diagnosis Date  . Adrenal adenoma   . Allergy   . Anemia   . Diabetes mellitus without complication ([place])   . Endometriosis of rectovaginal septum   . GERD (gastroesophageal reflux disease)   . Headache    MIGRAINES  . Hyperaldosteronism ([place])    Possibly  . Hyperlipidemia   . Hypertension   . Hypokalemia   . Ovarian cyst    Allergies Allergies  Allergen Reactions  . Ace Inhibitors Cough  . Hctz [Hydrochlorothiazide] Cough   Surgical History: reviewed and unchanged Family History: reviewed and unchanged Social History: reviewed and unchanged  Physical Exam: Estimated body mass index is 30.21 kg/m as calculated from the following:   Height as of 01/06/19: 5' 7.5" (1.715 m).   Weight as of 02/06/19: 195 lb 12.8 oz (88.8 kg). LMP 05/16/2014  General Appearance: Well nourished, in no apparent distress. Eyes: PERRLA, EOMs, conjunctiva no swelling or erythema, normal fundi and vessels. Sinuses: No Frontal/maxillary tenderness ENT/Mouth: Ext aud canals clear, normal light reflex with TMs without  erythema, bulging.  Good dentition. No erythema, swelling, or exudate on post pharynx. Tonsils not swollen or erythematous. Hearing normal.  Neck: Supple, thyroid normal. No bruits Respiratory: Respiratory effort normal, BS equal bilaterally without rales, rhonchi, wheezing or stridor. Cardio: RRR without murmurs, rubs or gallops. Brisk peripheral pulses without edema.  Chest: symmetric, with normal excursions and percussion. Breasts: defer Abdomen: Soft, +BS. Non tender, no guarding, rebound, hernias, masses, or organomegaly. .  Lymphatics: Non tender without lymphadenopathy.  Genitourinary: defer Musculoskeletal: Full ROM all peripheral extremities,5/5 strength, and normal gait. Skin: Warm, dry without rashes, lesions, ecchymosis.  Neuro: Cranial nerves intact, reflexes equal bilaterally. Normal muscle tone, no cerebellar symptoms. Sensation intact.  Psych: Awake and oriented X 3, normal affect, Insight and Judgment appropriate.    Vicie Mutters 8:42 AM

## 2019-04-14 ENCOUNTER — Ambulatory Visit: Payer: 59 | Admitting: Physician Assistant

## 2019-06-01 DIAGNOSIS — Z20828 Contact with and (suspected) exposure to other viral communicable diseases: Secondary | ICD-10-CM | POA: Diagnosis not present

## 2019-07-04 NOTE — Progress Notes (Signed)
Complete Physical  Assessment and Plan:  Encounter for general adult medical examination with abnormal findings 1 YEAR  Essential hypertension - continue medications, DASH diet, exercise and monitor at home. Call if greater than 130/80.  -     CBC with Differential/Platelet -     Hepatic function panel -     TSH -     BASIC METABOLIC PANEL WITH GFR -     Urinalysis w microscopic + reflex cultur -     Microalbumin / creatinine urine ratio -     EKG 12-Lead  Transient cerebral ischemia, unspecified type Control blood pressure, cholesterol, glucose, increase exercise.   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  -     Lipid panel - goal is less than 70  Hypokalemia -     BASIC METABOLIC PANEL WITH GFR -     Magnesium  Anemia, unspecified type - monitor, continue iron supp with Vitamin C and increase green leafy veggies  Allergic state, subsequent encounter  Endometriosis of rectovaginal septum  Vitamin D deficiency -     VITAMIN D 25 Hydroxy (Vit-D Deficiency, Fractures)  Type 2 diabetes mellitus with hyperlipidemia ([place]) Discussed general issues about diabetes pathophysiology and management., Educational material distributed., Suggested low cholesterol diet., Encouraged aerobic exercise., Discussed foot care., Reminded to get yearly retinal exam. - off insulin, get back on metformin, continue ozempic only on 0.53m, can increase to 1 mg in the future -     Hemoglobin A1c  Diabetes mellitus type 2, insulin dependent ([facility]) -     Hemoglobin A1c - OFF INSULIN AT THIS TIME- PENDING A1C  Screen for colon cancer -     Ambulatory referral to Gastroenterology - LONG DISCUSSION THAT SHE IS OVER DUE, HIGH SUGGEST ANY SCREENING AT THIS TIME HOWEVER SHE HAS HAD IBS SYMPTOMS IN THE PAST AND I HIGHLY SUGGEST A COLONOSCOPY SINCE IT IS ABLE TO  SCREEN FOR OTHER CAUSES.     Discussed med's effects and SE's. Screening labs and tests as requested with regular follow-up as recommended.  HPI 54y.o. female  presents for a complete physical.  Her blood pressure has been controlled at home, today their BP is   She does workout, she has been walking. She denies chest pain, shortness of breath, dizziness.   Her mother passed in Dec, she has not had time to grieve, has to do the estate etc. She is seeing a cSocial worker   BMI is Body mass index is 28.7 kg/m., she is working on diet and exercise. Wt Readings from Last 3 Encounters:  07/07/19 186 lb (84.4 kg)  02/06/19 195 lb 12.8 oz (88.8 kg)  01/06/19 194 lb 6.4 oz (88.2 kg)   She is not on cholesterol medication and denies myalgias. Her cholesterol is not at goal, LDL of 70. The cholesterol last visit was:   Lab Results  Component Value Date   CHOL 242 (H) 01/06/2019   HDL 62 01/06/2019   LDLCALC 158 (H) 01/06/2019   TRIG 102 01/06/2019   CHOLHDL 3.9 01/06/2019   She has been working on diet and exercise for diabetes   has not checked sugars due to taking care of her mother before her death She is on ozempic 0.5MG only right now- off insulin and metformin due to having low sugars.  Meter: Dexcom she is on bASA she is not on ACE/ARB  she is following with Dr. Darnell Level and denies foot ulcerations, nausea, paresthesia of the feet and visual disturbances.   Last A1C in the office was:  Lab Results  Component Value Date   HGBA1C >14.0 (H) 01/06/2019  Has possible history of aldosteronism, follows with Dr. Darnell Level for this and DM, on spirolactone '50mg'$  BID and tolerates well.  Lab Results  Component Value Date   GFRAA 99 01/06/2019   Patient is on Vitamin D supplement, 1000 daily. .    Current Medications:  Current Outpatient Medications on File Prior to Visit  Medication Sig Dispense Refill  . aspirin 81 MG EC tablet Take 81 mg by mouth every morning.  0  . Blood Glucose Monitoring  Suppl (ONE TOUCH ULTRA MINI) w/Device KIT See admin instructions. for testing  0  . Continuous Blood Gluc Sensor (DEXCOM G6 SENSOR) MISC 1 Device by Does not apply route continuous. 3 each 3  . Continuous Blood Gluc Transmit (DEXCOM G6 TRANSMITTER) MISC 1 Device by Does not apply route continuous. 4 each 0  . Cyanocobalamin (B-12 PO) Take 1 tablet by mouth daily. Reported on 12/14/2015    . fluconazole (DIFLUCAN) 150 MG tablet Take 1 tablet (150 mg total) by mouth daily. 1 tablet 3  . Insulin Detemir (LEVEMIR) 100 UNIT/ML Pen INJECT 10-20 UNITS DEPENDING ON MORNING SUGAR ONCE DAILY SUBCUTANEOUSLY 15 mL 11  . Iron TABS Take 1 tablet by mouth daily. Reported on 09/08/2015    . Magnesium 250 MG TABS Take by mouth.    . metFORMIN (GLUCOPHAGE XR) 500 MG 24 hr tablet Take 2 tablets 2 x day for Diabetes 360 tablet 99  . NOVOFINE 32G X 6 MM MISC Needs daily for insulin 100 each 5  . omeprazole (PRILOSEC) 40 MG capsule Take 1 capsule (40 mg total) by mouth daily. 90 capsule 3  . ONE TOUCH ULTRA TEST test strip 1 each by Other route 2 (two) times daily. for testing 100 each 0  . rosuvastatin (CRESTOR) 5 MG tablet Take 1 tablet (5 mg total) by mouth at bedtime. 90 tablet 1  . Semaglutide,0.25 or 0.'5MG'$ /DOS, (OZEMPIC, 0.25 OR 0.5 MG/DOSE,) 2 MG/1.5ML SOPN Inject 0.5 mg into the skin once a week. 3 pen 3  . spironolactone (ALDACTONE) 50 MG tablet Take 100 mg in am and 50 mg in pm 270 tablet 3  . Vitamin D, Ergocalciferol, (DRISDOL) 50000 units CAPS capsule TAKE ONE CAPSULE BY MOUTH EVERY 7 DAYS 12 capsule 7   No current facility-administered medications on file prior to visit.   Health Maintenance:   Immunization History  Administered Date(s) Administered  . Td 06/06/2007   Tetanus: 2009 Pneumovax: N/A Flu vaccine: Declines Zostavax: N/A  Pap: 02/2017 neg HPV no new partners negative MGM: 02/2019 DEXA: N/A  Colonoscopy:  HAS NOT HAD, WAS SUPPOSE TO GET-[facility] EGD: N/A Ct HEAD 2017 MRI BRAIN  2016  Medical History:  Past Medical History:  Diagnosis Date  . Adrenal adenoma   . Allergy   . Anemia   . Diabetes mellitus without complication ([place])   . Endometriosis of rectovaginal septum   . GERD (gastroesophageal reflux disease)   . Headache    MIGRAINES  . Hyperaldosteronism ([place])    Possibly  . Hyperlipidemia   . Hypertension   . Hypokalemia   . Ovarian cyst    Allergies Allergies  Allergen Reactions  . Ace Inhibitors Cough  . Hctz [Hydrochlorothiazide] Cough    SURGICAL HISTORY She  has a past surgical  history that includes Cesarean section; Cardiac catheterization; Tubal ligation; and Robotic assisted total hysterectomy with bilateral salpingo oophorectomy (Bilateral, 06/16/2014). FAMILY HISTORY Her family history includes Colon cancer in her father; Diabetes in her father and mother; Hepatitis C in her father; Pancreatic cancer in her father; Scleroderma in her maternal grandmother; Stroke in her brother, maternal grandfather, and mother. SOCIAL HISTORY She  reports that she has never smoked. She has never used smokeless tobacco. She reports current alcohol use. She reports that she does not use drugs.  Physical Exam: Estimated body mass index is 30.21 kg/m as calculated from the following:   Height as of 01/06/19: 5' 7.5" (1.715 m).   Weight as of 02/06/19: 195 lb 12.8 oz (88.8 kg). LMP 05/16/2014  General Appearance: Well nourished, in no apparent distress. Eyes: PERRLA, EOMs, conjunctiva no swelling or erythema, normal fundi and vessels. Sinuses: No Frontal/maxillary tenderness ENT/Mouth: Ext aud canals clear, normal light reflex with TMs without erythema, bulging.  Good dentition. No erythema, swelling, or exudate on post pharynx. Tonsils not swollen or erythematous. Hearing normal.  Neck: Supple, thyroid normal. No bruits Respiratory: Respiratory effort normal, BS equal bilaterally without rales, rhonchi, wheezing or stridor. Cardio: RRR without murmurs,  rubs or gallops. Brisk peripheral pulses without edema.  Chest: symmetric, with normal excursions and percussion. Breasts: defer Abdomen: Soft, +BS. Non tender, no guarding, rebound, hernias, masses, or organomegaly. .  Lymphatics: Non tender without lymphadenopathy.  Genitourinary: defer Musculoskeletal: Full ROM all peripheral extremities,5/5 strength, and normal gait. Skin: Warm, dry without rashes, lesions, ecchymosis.  Neuro: Cranial nerves intact, reflexes equal bilaterally. Normal muscle tone, no cerebellar symptoms. Sensation intact.  Psych: Awake and oriented X 3, normal affect, Insight and Judgment appropriate.   EKG: WNL, NO ST CHANGES     Vicie Mutters 9:51 AM

## 2019-07-07 ENCOUNTER — Other Ambulatory Visit: Payer: Self-pay

## 2019-07-07 ENCOUNTER — Ambulatory Visit (INDEPENDENT_AMBULATORY_CARE_PROVIDER_SITE_OTHER): Payer: No Typology Code available for payment source | Admitting: Physician Assistant

## 2019-07-07 ENCOUNTER — Encounter: Payer: Self-pay | Admitting: Physician Assistant

## 2019-07-07 VITALS — BP 132/88 | HR 66 | Temp 97.9°F | Ht 67.5 in | Wt 186.0 lb

## 2019-07-07 DIAGNOSIS — I10 Essential (primary) hypertension: Secondary | ICD-10-CM | POA: Diagnosis not present

## 2019-07-07 DIAGNOSIS — Z79899 Other long term (current) drug therapy: Secondary | ICD-10-CM | POA: Diagnosis not present

## 2019-07-07 DIAGNOSIS — G459 Transient cerebral ischemic attack, unspecified: Secondary | ICD-10-CM

## 2019-07-07 DIAGNOSIS — E785 Hyperlipidemia, unspecified: Secondary | ICD-10-CM | POA: Diagnosis not present

## 2019-07-07 DIAGNOSIS — E1169 Type 2 diabetes mellitus with other specified complication: Secondary | ICD-10-CM

## 2019-07-07 DIAGNOSIS — Z0000 Encounter for general adult medical examination without abnormal findings: Secondary | ICD-10-CM | POA: Diagnosis not present

## 2019-07-07 DIAGNOSIS — E119 Type 2 diabetes mellitus without complications: Secondary | ICD-10-CM

## 2019-07-07 DIAGNOSIS — E559 Vitamin D deficiency, unspecified: Secondary | ICD-10-CM | POA: Diagnosis not present

## 2019-07-07 DIAGNOSIS — T7840XD Allergy, unspecified, subsequent encounter: Secondary | ICD-10-CM

## 2019-07-07 DIAGNOSIS — Z136 Encounter for screening for cardiovascular disorders: Secondary | ICD-10-CM

## 2019-07-07 DIAGNOSIS — Z794 Long term (current) use of insulin: Secondary | ICD-10-CM | POA: Diagnosis not present

## 2019-07-07 DIAGNOSIS — D649 Anemia, unspecified: Secondary | ICD-10-CM

## 2019-07-07 DIAGNOSIS — Z1211 Encounter for screening for malignant neoplasm of colon: Secondary | ICD-10-CM

## 2019-07-07 DIAGNOSIS — E876 Hypokalemia: Secondary | ICD-10-CM

## 2019-07-07 DIAGNOSIS — E269 Hyperaldosteronism, unspecified: Secondary | ICD-10-CM

## 2019-07-07 DIAGNOSIS — Z0001 Encounter for general adult medical examination with abnormal findings: Secondary | ICD-10-CM

## 2019-07-07 NOTE — Patient Instructions (Addendum)
Going to refer for colonoscopy  Colon cancer is 3rd most diagnosed cancer and 2nd leading cause of death in both men and women 55 years of age and older despite being one of the most preventable and treatable cancers if found early.  4 of out 5 people diagnosed with colon cancer have NO prior family history.  When caught EARLY 90% of colon cancer is curable.    General eating tips  What to Avoid . Avoid added sugars o Often added sugar can be found in processed foods such as many condiments, dry cereals, cakes, cookies, chips, crisps, crackers, candies, sweetened drinks, etc.  o Read labels and AVOID/DECREASE use of foods with the following in their ingredient list: Sugar, fructose, high fructose corn syrup, sucrose, glucose, maltose, dextrose, molasses, cane sugar, brown sugar, any type of syrup, agave nectar, etc.   . Avoid snacking in between meals- drink water or if you feel you need a snack, pick a high water content snack such as cucumbers, watermelon, or any veggie.  Marland Kitchen Avoid foods made with flour o If you are going to eat food made with flour, choose those made with whole-grains; and, minimize your consumption as much as is tolerable . Avoid processed foods o These foods are generally stocked in the middle of the grocery store.  o Focus on shopping on the perimeter of the grocery.  What to Include . Vegetables o GREEN LEAFY VEGETABLES: Kale, spinach, mustard greens, collard greens, cabbage, broccoli, etc. o OTHER: Asparagus, cauliflower, eggplant, carrots, peas, Brussel sprouts, tomatoes, bell peppers, zucchini, beets, cucumbers, etc. . Grains, seeds, and legumes o Beans: kidney beans, black eyed peas, garbanzo beans, black beans, pinto beans, etc. o Whole, unrefined grains: brown rice, barley, bulgur, oatmeal, etc. . Healthy fats  o Avoid highly processed fats such as vegetable oil o Examples of healthy fats: avocado, olives, virgin olive oil, dark chocolate (?72% Cocoa), nuts  (peanuts, almonds, walnuts, cashews, pecans, etc.) o Please still do small amount of these healthy fats, they are dense in calories.  . Low - Moderate Intake of Animal Sources of Protein o Meat sources: chicken, [place], salmon, tuna. Limit to 4 ounces of meat at one time or the size of your palm. o Consider limiting dairy sources, but when choosing dairy focus on: PLAIN [place] yogurt, cottage cheese, high-protein milk . Fruit o Choose berries

## 2019-07-08 ENCOUNTER — Other Ambulatory Visit: Payer: Self-pay | Admitting: Physician Assistant

## 2019-07-08 LAB — CBC WITH DIFFERENTIAL/PLATELET
Absolute Monocytes: 437 cells/uL (ref 200–950)
Basophils Absolute: 30 cells/uL (ref 0–200)
Basophils Relative: 0.4 %
Eosinophils Absolute: 37 cells/uL (ref 15–500)
Eosinophils Relative: 0.5 %
HCT: 40.4 % (ref 35.0–45.0)
Hemoglobin: 13.4 g/dL (ref 11.7–15.5)
Lymphs Abs: 2116 cells/uL (ref 850–3900)
MCH: 27.5 pg (ref 27.0–33.0)
MCHC: 33.2 g/dL (ref 32.0–36.0)
MCV: 83 fL (ref 80.0–100.0)
MPV: 11.1 fL (ref 7.5–12.5)
Monocytes Relative: 5.9 %
Neutro Abs: 4780 cells/uL (ref 1500–7800)
Neutrophils Relative %: 64.6 %
Platelets: 211 10*3/uL (ref 140–400)
RBC: 4.87 10*6/uL (ref 3.80–5.10)
RDW: 12.5 % (ref 11.0–15.0)
Total Lymphocyte: 28.6 %
WBC: 7.4 10*3/uL (ref 3.8–10.8)

## 2019-07-08 LAB — URINALYSIS, ROUTINE W REFLEX MICROSCOPIC
Bacteria, UA: NONE SEEN /HPF
Bilirubin Urine: NEGATIVE
Glucose, UA: NEGATIVE
Hgb urine dipstick: NEGATIVE
Hyaline Cast: NONE SEEN /LPF
Ketones, ur: NEGATIVE
Nitrite: NEGATIVE
Protein, ur: NEGATIVE
Specific Gravity, Urine: 1.018 (ref 1.001–1.03)
pH: 5.5 (ref 5.0–8.0)

## 2019-07-08 LAB — COMPLETE METABOLIC PANEL WITH GFR
AG Ratio: 1.6 (calc) (ref 1.0–2.5)
ALT: 20 U/L (ref 6–29)
AST: 20 U/L (ref 10–35)
Albumin: 4.8 g/dL (ref 3.6–5.1)
Alkaline phosphatase (APISO): 86 U/L (ref 37–153)
BUN: 9 mg/dL (ref 7–25)
CO2: 26 mmol/L (ref 20–32)
Calcium: 9.9 mg/dL (ref 8.6–10.4)
Chloride: 104 mmol/L (ref 98–110)
Creat: 0.72 mg/dL (ref 0.50–1.05)
GFR, Est African American: 110 mL/min/{1.73_m2} (ref >=60)
GFR, Est Non African American: 95 mL/min/{1.73_m2} (ref >=60)
Globulin: 3 g/dL (calc) (ref 1.9–3.7)
Glucose, Bld: 96 mg/dL (ref 65–99)
Potassium: 3.9 mmol/L (ref 3.5–5.3)
Sodium: 140 mmol/L (ref 135–146)
Total Bilirubin: 0.7 mg/dL (ref 0.2–1.2)
Total Protein: 7.8 g/dL (ref 6.1–8.1)

## 2019-07-08 LAB — HEMOGLOBIN A1C
Hgb A1c MFr Bld: 7.1 % of total Hgb — ABNORMAL HIGH (ref <5.7)
Mean Plasma Glucose: 157 (calc)
eAG (mmol/L): 8.7 (calc)

## 2019-07-08 LAB — MICROALBUMIN / CREATININE URINE RATIO
Creatinine, Urine: 157 mg/dL (ref 20–275)
Microalb Creat Ratio: 4 mcg/mg creat (ref <30)
Microalb, Ur: 0.7 mg/dL

## 2019-07-08 LAB — LIPID PANEL
Cholesterol: 211 mg/dL — ABNORMAL HIGH (ref <200)
HDL: 59 mg/dL (ref >=50)
LDL Cholesterol (Calc): 138 mg/dL (calc) — ABNORMAL HIGH
Non-HDL Cholesterol (Calc): 152 mg/dL (calc) — ABNORMAL HIGH (ref <130)
Total CHOL/HDL Ratio: 3.6 (calc) (ref <5.0)
Triglycerides: 53 mg/dL (ref <150)

## 2019-07-08 LAB — TSH: TSH: 2.37 mIU/L

## 2019-07-08 LAB — VITAMIN D 25 HYDROXY (VIT D DEFICIENCY, FRACTURES): Vit D, 25-Hydroxy: 65 ng/mL (ref 30–100)

## 2019-07-08 LAB — MAGNESIUM: Magnesium: 2.1 mg/dL (ref 1.5–2.5)

## 2019-07-08 MED ORDER — ROSUVASTATIN CALCIUM 10 MG PO TABS: 10.0000 mg | ORAL_TABLET | Freq: Every day | ORAL | 1 refills | Status: DC

## 2019-07-17 NOTE — Addendum Note (Signed)
Addended by: Vicie Mutters R on: 07/17/2019 01:58 PM   Modules accepted: Orders

## 2019-08-06 ENCOUNTER — Encounter: Payer: Self-pay | Admitting: Gastroenterology

## 2019-09-10 ENCOUNTER — Ambulatory Visit (AMBULATORY_SURGERY_CENTER): Payer: Self-pay | Admitting: *Deleted

## 2019-09-10 ENCOUNTER — Other Ambulatory Visit: Payer: Self-pay

## 2019-09-10 VITALS — Temp 97.1°F | Ht 67.5 in | Wt 190.0 lb

## 2019-09-10 DIAGNOSIS — Z800 Family history of malignant neoplasm of digestive organs: Secondary | ICD-10-CM

## 2019-09-10 DIAGNOSIS — Z01818 Encounter for other preprocedural examination: Secondary | ICD-10-CM

## 2019-09-10 MED ORDER — NA SULFATE-K SULFATE-MG SULF 17.5-3.13-1.6 GM/177ML PO SOLN: 1.0000 | Freq: Once | ORAL | 0 refills | Status: AC

## 2019-09-10 NOTE — Progress Notes (Signed)

## 2019-09-18 ENCOUNTER — Other Ambulatory Visit (HOSPITAL_COMMUNITY)
Admission: RE | Admit: 2019-09-18 | Discharge: 2019-09-18 | Disposition: A | Discharge status: Home / Self Care | Payer: 59 | Source: Ambulatory Visit | Attending: Gastroenterology | Admitting: Gastroenterology

## 2019-09-18 ENCOUNTER — Other Ambulatory Visit: Payer: Self-pay

## 2019-09-18 DIAGNOSIS — Z20822 Contact with and (suspected) exposure to covid-19: Secondary | ICD-10-CM | POA: Diagnosis not present

## 2019-09-18 DIAGNOSIS — Z01812 Encounter for preprocedural laboratory examination: Secondary | ICD-10-CM | POA: Insufficient documentation

## 2019-09-19 ENCOUNTER — Encounter: Payer: Self-pay | Admitting: Gastroenterology

## 2019-09-19 LAB — SARS CORONAVIRUS 2 (TAT 6-24 HRS): SARS Coronavirus 2: NEGATIVE

## 2019-09-22 ENCOUNTER — Encounter: Payer: Self-pay | Admitting: Gastroenterology

## 2019-09-22 ENCOUNTER — Ambulatory Visit (AMBULATORY_SURGERY_CENTER): Payer: 59 | Admitting: Gastroenterology

## 2019-09-22 ENCOUNTER — Other Ambulatory Visit: Payer: Self-pay

## 2019-09-22 VITALS — BP 108/73 | HR 96 | Temp 96.9°F | Resp 16 | Ht 67.5 in | Wt 190.0 lb

## 2019-09-22 DIAGNOSIS — D125 Benign neoplasm of sigmoid colon: Secondary | ICD-10-CM | POA: Diagnosis not present

## 2019-09-22 DIAGNOSIS — Z800 Family history of malignant neoplasm of digestive organs: Secondary | ICD-10-CM | POA: Diagnosis not present

## 2019-09-22 DIAGNOSIS — D122 Benign neoplasm of ascending colon: Secondary | ICD-10-CM

## 2019-09-22 DIAGNOSIS — I10 Essential (primary) hypertension: Secondary | ICD-10-CM | POA: Diagnosis not present

## 2019-09-22 DIAGNOSIS — D123 Benign neoplasm of transverse colon: Secondary | ICD-10-CM | POA: Diagnosis not present

## 2019-09-22 DIAGNOSIS — E119 Type 2 diabetes mellitus without complications: Secondary | ICD-10-CM | POA: Diagnosis not present

## 2019-09-22 DIAGNOSIS — Z1211 Encounter for screening for malignant neoplasm of colon: Secondary | ICD-10-CM

## 2019-09-22 MED ORDER — SODIUM CHLORIDE 0.9 % IV SOLN: 500.0000 mL | Freq: Once | INTRAVENOUS | Status: DC

## 2019-09-22 NOTE — Progress Notes (Signed)
Report given to PACU, vss 

## 2019-09-22 NOTE — Progress Notes (Signed)
Called to room to assist during endoscopic procedure.  Patient ID and intended procedure confirmed with present staff. Received instructions for my participation in the procedure from the performing physician.  

## 2019-09-22 NOTE — Patient Instructions (Signed)
Impression/Recommendations:  Polyp handout given to patient. Hemorrhoid handout given to patient.  Resume previous diet. Continue present medications. Await pathology results.  YOU HAD AN ENDOSCOPIC PROCEDURE TODAY AT THE [facility]:   Refer to the procedure report that was given to you for any specific questions about what was found during the examination.  If the procedure report does not answer your questions, please call your gastroenterologist to clarify.  If you requested that your care partner not be given the details of your procedure findings, then the procedure report has been included in a sealed envelope for you to review at your convenience later.  YOU SHOULD EXPECT: Some feelings of bloating in the abdomen. Passage of more gas than usual.  Walking can help get rid of the air that was put into your GI tract during the procedure and reduce the bloating. If you had a lower endoscopy (such as a colonoscopy or flexible sigmoidoscopy) you may notice spotting of blood in your stool or on the toilet paper. If you underwent a bowel prep for your procedure, you may not have a normal bowel movement for a few days.  Please Note:  You might notice some irritation and congestion in your nose or some drainage.  This is from the oxygen used during your procedure.  There is no need for concern and it should clear up in a day or so.  SYMPTOMS TO REPORT IMMEDIATELY:   Following lower endoscopy (colonoscopy or flexible sigmoidoscopy):  Excessive amounts of blood in the stool  Significant tenderness or worsening of abdominal pains  Swelling of the abdomen that is new, acute  Fever of 100F or higher  For urgent or emergent issues, a gastroenterologist can be reached at any hour by calling [phone]. Do not use MyChart messaging for urgent concerns.    DIET:  We do recommend a small meal at first, but then you may proceed to your regular diet.  Drink plenty of fluids but you  should avoid alcoholic beverages for 24 hours.  ACTIVITY:  You should plan to take it easy for the rest of today and you should NOT DRIVE or use heavy machinery until tomorrow (because of the sedation medicines used during the test).    FOLLOW UP: Our staff will call the number listed on your records 48-72 hours following your procedure to check on you and address any questions or concerns that you may have regarding the information given to you following your procedure. If we do not reach you, we will leave a message.  We will attempt to reach you two times.  During this call, we will ask if you have developed any symptoms of COVID 19. If you develop any symptoms (ie: fever, flu-like symptoms, shortness of breath, cough etc.) before then, please call [phone].  If you test positive for Covid 19 in the 2 weeks post procedure, please call and report this information to us.    If any biopsies were taken you will be contacted by phone or by letter within the next 1-3 weeks.  Please call us at [phone] if you have not heard about the biopsies in 3 weeks.    SIGNATURES/CONFIDENTIALITY: You and/or your care partner have signed paperwork which will be entered into your electronic medical record.  These signatures attest to the fact that that the information above on your After Visit Summary has been reviewed and is understood.  Full responsibility of the confidentiality of this discharge information lies with   and/or your care-partner. 

## 2019-09-22 NOTE — Op Note (Signed)
[facility] Patient Name: Kristin Hess Procedure Date: 09/22/2019 11:15 AM MRN: [id number] Endoscopist: Remo Lipps P. Havery Moros , MD Age: 54 Referring MD:  Date of Birth: 1966/02/17 Gender: Female Account #: [id number] Procedure:                Colonoscopy Indications:              Screening in patient at increased risk: Family                            history of 1st-degree relative with colorectal                            cancer (father diagnosed age 40) - first time exam Medicines:                Monitored Anesthesia Care Procedure:                Pre-Anesthesia Assessment:                           - Prior to the procedure, a History and Physical                            was performed, and patient medications and                            allergies were reviewed. The patient's tolerance of                            previous anesthesia was also reviewed. The risks                            and benefits of the procedure and the sedation                            options and risks were discussed with the patient.                            All questions were answered, and informed consent                            was obtained. Prior Anticoagulants: The patient has                            taken no previous anticoagulant or antiplatelet                            agents. ASA Grade Assessment: II - A patient with                            mild systemic disease. After reviewing the risks                            and benefits, the patient was deemed in  satisfactory condition to undergo the procedure.                           After obtaining informed consent, the colonoscope                            was passed under direct vision. Throughout the                            procedure, the patient's blood pressure, pulse, and                            oxygen saturations were monitored continuously. The   Colonoscope was introduced through the anus and                            advanced to the the cecum, identified by                            appendiceal orifice and ileocecal valve. The                            colonoscopy was performed without difficulty. The                            patient tolerated the procedure well. The quality                            of the bowel preparation was good. The ileocecal                            valve, appendiceal orifice, and rectum were                            photographed. Scope In: 11:26:08 AM Scope Out: 11:51:51 AM Scope Withdrawal Time: 0 hours 20 minutes 58 seconds  Total Procedure Duration: 0 hours 25 minutes 43 seconds  Findings:                 The perianal and digital rectal examinations were                            normal.                           Three sessile polyps were found in the ascending                            colon. The polyps were 3 to 6 mm in size. These                            polyps were removed with a cold snare. Resection                            and retrieval were complete.  A diminutive polyp was found in the transverse                            colon. The polyp was sessile. The polyp was removed                            with a cold snare. Resection and retrieval were                            complete.                           A 3 mm polyp was found in the sigmoid colon. The                            polyp was sessile. The polyp was removed with a                            cold snare. Resection and retrieval were complete.                           Internal hemorrhoids were found during                            retroflexion. The hemorrhoids were small.                           The exam was otherwise without abnormality. Complications:            No immediate complications. Estimated blood loss:                            Minimal. Estimated Blood Loss:      Estimated blood loss was minimal. Impression:               - Three 3 to 6 mm polyps in the ascending colon,                            removed with a cold snare. Resected and retrieved.                           - One diminutive polyp in the transverse colon,                            removed with a cold snare. Resected and retrieved.                           - One 3 mm polyp in the sigmoid colon, removed with                            a cold snare. Resected and retrieved.                           - Internal hemorrhoids.                           -  The examination was otherwise normal. Recommendation:           - Patient has a contact number available for                            emergencies. The signs and symptoms of potential                            delayed complications were discussed with the                            patient. Return to normal activities tomorrow.                            Written discharge instructions were provided to the                            patient.                           - Resume previous diet.                           - Continue present medications.                           - Await pathology results. Remo Lipps P. Denton Derks, MD 09/22/2019 11:57:06 AM This report has been signed electronically.

## 2019-09-22 NOTE — Progress Notes (Signed)
DT- vs  LC- temp ,front desk   Pt's states no medical or surgical changes since previsit or office visit.

## 2019-09-24 ENCOUNTER — Telehealth: Payer: Self-pay | Admitting: *Deleted

## 2019-09-24 NOTE — Telephone Encounter (Signed)
  Follow up Call-  Call back number 09/22/2019  Post procedure Call Back phone  # [phone]  Permission to leave phone message Yes  Some recent data might be hidden     Patient questions:  Do you have a fever, pain , or abdominal swelling? No. Pain Score  0 *  Have you tolerated food without any problems? Yes.    Have you been able to return to your normal activities? Yes.    Do you have any questions about your discharge instructions: Diet   No. Medications  No. Follow up visit  No.  Do you have questions or concerns about your Care? No.  Actions: * If pain score is 4 or above: No action needed, pain <4.  1. Have you developed a fever since your procedure? no  2.   Have you had an respiratory symptoms (SOB or cough) since your procedure? no  3.   Have you tested positive for COVID 19 since your procedure no  4.   Have you had any family members/close contacts diagnosed with the COVID 19 since your procedure?  no   If yes to any of these questions please route to Joylene John, RN and Erenest Rasher, RN

## 2019-10-06 NOTE — Progress Notes (Deleted)
FOLLOW UP VISIT  Assessment and Plan:  Essential hypertension - continue medications, DASH diet, exercise and monitor at home. Call if greater than 130/80.  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  -     Lipid panel  Anemia, unspecified type - monitor, continue iron supp with Vitamin C and increase green leafy veggies  Discussed med's effects and SE's. Screening labs and tests as requested with regular follow-up as recommended. Future Appointments  Date Time Provider [place]  10/08/2019  8:45 AM Vicie Mutters, PA-C [facility] None  07/08/2020 10:00 AM Vicie Mutters, PA-C [facility] None    HPI 54 y.o. female presents for elevated sugars after being lost to follow up, last seen 05/2017.  She has been working on diet and exercise for diabetes  With hyperlipidemia Has nueuropathy She is on metformin  ozempic Has dexcom 6 she is on bASA she is not on ACE/ARB she was following with Dr. Darnell Level. But she has not been back denies foot ulcerations, nausea, paresthesia of the feet and visual disturbances.  Last A1C in the office was:  Lab Results  Component Value Date   HGBA1C 7.1 (H) 07/07/2019    Has possible history of aldosteronism, follows with Dr. Darnell Level for this and DM, on spirolactone 78m BID and tolerates well.  Lab Results  Component Value Date   GFRAA 110 07/07/2019    Her blood pressure has been controlled at home,  today their BP is   She does workout, she has been walking. She denies chest pain, shortness of breath, dizziness.  BMI is There is no height or weight on file to calculate BMI., she is working on diet and exercise. Her mom has stage 3 lung cancer, [place] 89 [place], her aunt is there and helps with care. She states her divorce is final but there is still unresolved issues with property.  Wt Readings from Last 3 Encounters:  09/22/19 190 lb (86.2 kg)  09/10/19 190 lb (86.2 kg)  07/07/19 186 lb (84.4 kg)   She is not on cholesterol medication and denies myalgias. Her cholesterol is not at goal, LDL of 70. The cholesterol last visit was:   Lab Results  Component Value Date   CHOL 211 (H) 07/07/2019   HDL 59 07/07/2019   LDLCALC 138 (H) 07/07/2019   TRIG 53 07/07/2019   CHOLHDL 3.6 07/07/2019   Patient is on Vitamin D supplement, 1000 daily. Lab Results  Component Value Date   VD25OH 65 07/07/2019     Current Medications:   Current Outpatient Medications (Endocrine & Metabolic):  .  metFORMIN (GLUCOPHAGE XR) 500 MG 24 hr tablet, Take 2 tablets 2 x day for Diabetes .  Semaglutide,0.25 or 0.5MG/DOS, (OZEMPIC, 0.25 OR 0.5 MG/DOSE,) 2 MG/1.5ML SOPN, Inject 0.5 mg into the skin once a week.  Current Outpatient Medications (Cardiovascular):  .  rosuvastatin (CRESTOR) 10 MG tablet, Take 1 tablet (10  mg total) by mouth at bedtime. (Patient not taking: Reported on 09/22/2019) .  spironolactone (ALDACTONE) 50 MG tablet, Take 100 mg in am and 50 mg in pm   Current Outpatient Medications (Analgesics):  .  aspirin 81 MG EC tablet, Take 81 mg by mouth every morning.  Current Outpatient Medications (Hematological):  Marland Kitchen  Cyanocobalamin (B-12 PO), Take 1 tablet by mouth daily. Reported on 12/14/2015 .  Iron TABS, Take 1 tablet by mouth daily. Reported on 09/08/2015  Current Outpatient Medications (Other):  .  Blood Glucose Monitoring Suppl (ONE TOUCH ULTRA MINI)  w/Device KIT, See admin instructions. for testing .  Continuous Blood Gluc Sensor (DEXCOM G6 SENSOR) MISC, 1 Device by Does not apply route continuous. .  Continuous Blood Gluc Transmit (DEXCOM G6 TRANSMITTER) MISC, 1 Device by Does not apply route continuous. .  Magnesium 250 MG TABS, Take by mouth. Marland Kitchen  NOVOFINE 32G X 6 MM MISC, Needs daily for insulin (Patient not taking: Reported on 09/22/2019) .  omeprazole (PRILOSEC) 40 MG capsule, Take 1 capsule (40 mg total) by mouth daily. (Patient not taking: Reported on 09/10/2019) .  ONE TOUCH ULTRA TEST test strip, 1 each by Other route 2 (two) times daily. for testing .  Vitamin D, Ergocalciferol, (DRISDOL) 50000 units CAPS capsule, TAKE ONE CAPSULE BY MOUTH EVERY 7 DAYS  Medical History:  Past Medical History:  Diagnosis Date  . Adrenal adenoma   . Allergy   . Anemia   . Diabetes mellitus without complication ([place])   . Endometriosis of rectovaginal septum   . GERD (gastroesophageal reflux disease)   . Headache    MIGRAINES  . Hyperaldosteronism ([place])    Possibly  . Hyperlipidemia   . Hypertension   . Hypokalemia   . Ovarian cyst    Allergies Allergies  Allergen Reactions  . Ace Inhibitors Cough  . Hctz [Hydrochlorothiazide] Cough   Surgical History: reviewed and unchanged Family History: reviewed and unchanged Social History: reviewed and unchanged  Physical Exam: Estimated body mass index is 29.32 kg/m as calculated from the following:   Height as of 09/22/19: 5' 7.5" (1.715 m).   Weight as of 09/22/19: 190 lb (86.2 kg). LMP 05/16/2014  General Appearance: Well nourished, in no apparent distress. Eyes: PERRLA, EOMs, conjunctiva no swelling or erythema, normal fundi and vessels. Sinuses: No Frontal/maxillary tenderness ENT/Mouth: Ext aud canals clear, normal light reflex with TMs without erythema, bulging.  Good dentition. No erythema, swelling, or exudate on post pharynx. Tonsils not swollen or erythematous. Hearing normal.   Neck: Supple, thyroid normal. No bruits Respiratory: Respiratory effort normal, BS equal bilaterally without rales, rhonchi, wheezing or stridor. Cardio: RRR without murmurs, rubs or gallops. Brisk peripheral pulses without edema.  Chest: symmetric, with normal excursions and percussion. Breasts: defer Abdomen: Soft, +BS. Non tender, no guarding, rebound, hernias, masses, or organomegaly. .  Lymphatics: Non tender without lymphadenopathy.  Genitourinary: defer Musculoskeletal: Full ROM all peripheral extremities,5/5 strength, and normal gait. Skin: Warm, dry without rashes, lesions, ecchymosis.  Neuro: Cranial nerves intact, reflexes equal bilaterally. Normal muscle tone, no cerebellar symptoms. Sensation intact.  Psych: Awake and oriented X 3, normal affect, Insight and Judgment appropriate.    Vicie Mutters 12:30 PM

## 2019-10-08 ENCOUNTER — Ambulatory Visit: Payer: No Typology Code available for payment source | Admitting: Physician Assistant

## 2019-10-08 DIAGNOSIS — E559 Vitamin D deficiency, unspecified: Secondary | ICD-10-CM

## 2019-10-08 DIAGNOSIS — E785 Hyperlipidemia, unspecified: Secondary | ICD-10-CM

## 2019-10-08 DIAGNOSIS — E269 Hyperaldosteronism, unspecified: Secondary | ICD-10-CM

## 2019-10-08 DIAGNOSIS — G459 Transient cerebral ischemic attack, unspecified: Secondary | ICD-10-CM

## 2019-10-08 DIAGNOSIS — I10 Essential (primary) hypertension: Secondary | ICD-10-CM

## 2019-10-08 DIAGNOSIS — E1169 Type 2 diabetes mellitus with other specified complication: Secondary | ICD-10-CM

## 2019-10-08 DIAGNOSIS — Z79899 Other long term (current) drug therapy: Secondary | ICD-10-CM

## 2019-10-08 DIAGNOSIS — E876 Hypokalemia: Secondary | ICD-10-CM

## 2019-10-08 DIAGNOSIS — E119 Type 2 diabetes mellitus without complications: Secondary | ICD-10-CM

## 2019-10-16 ENCOUNTER — Other Ambulatory Visit: Payer: Self-pay | Admitting: Physician Assistant

## 2019-10-16 MED ORDER — DEXCOM G6 TRANSMITTER MISC: 1.0000 | 0 refills | Status: DC

## 2019-10-19 NOTE — Progress Notes (Signed)
FOLLOW UP VISIT  Assessment and Plan:  Essential hypertension - continue medications, DASH diet, exercise and monitor at home. Call if greater than 130/80.  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 Discussed increasing the ozepmic to 1 mg, wants to remain on the 0.70m for now and will work on lifestyle She will pinch up where she injects for subQ injection to try to avoid cramping -     Hemoglobin A1c  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[place] -     BASIC METABOLIC PANEL WITH GFR  Hyperlipidemia, unspecified hyperlipidemia type -continue medications, check lipids, decrease fatty foods, increase activity.  -     Lipid panel   Discussed med's effects and SE's. Screening labs and tests as requested with regular follow-up as recommended. Future Appointments  Date Time Provider DMount Calm 10/20/2019  8:45 AM CVicie Mutters PA-C [facility] None  07/08/2020 10:00 AM CVicie Mutters PA-C [facility] None    HPI 54y.o. female presents for follow up for HTN, chol, DM2.   She has been working on diet and exercise for diabetes  With hyperlipidemia- not at goal, added crestor 5 mg last visit Has nueuropathy She is on metformin - added back ozempic 0.5MG DAILY- will get some stomach discomfort intermittent between doses- states will feel in the area that she takes the shot- feels more muscular Has dexcom 6- JUST GOT RENEWED going to start wearing again she is on bASA she is not on ACE/ARB she was following with Dr. GDarnell Level But she has not been back denies foot ulcerations, nausea, paresthesia of the feet and visual disturbances.   Last A1C in the office was:  Lab  Results  Component Value Date   HGBA1C 7.1 (H) 07/07/2019    Has possible history of aldosteronism, follows with Dr. GDarnell Levelfor this and DM, on spirolactone 545mBID and tolerates well.  Lab Results  Component Value Date   GFRAA 110 07/07/2019    Her blood pressure has been controlled at home, today their BP is BP: 128/86 She does workout, she has been walking. She denies chest pain, shortness of breath, dizziness.  BMI is Body mass index is 30.31 kg/m., she is working on diet and exercise. Her mom passed in Dec. She states her divorce is final but there is still unresolved issues with property.  Wt Readings from Last 3 Encounters:  10/20/19 196 lb 6.4 oz (89.1 kg)  09/22/19 190 lb (86.2 kg)  09/10/19 190 lb (86.2 kg)   She is on cholesterol medication, she is on crestor [id number] on 1/2 a day and denies myalgias. Her cholesterol is not at goal, LDL of 70. The cholesterol last visit was:   Lab Results  Component Value Date   CHOL 211 (H) 07/07/2019   HDL 59 07/07/2019   LDLCALC 138 (H) 07/07/2019   TRIG 53 07/07/2019   CHOLHDL 3.6 07/07/2019   Patient is on Vitamin D supplement, 1000 daily. Lab Results  Component Value Date   VD25OH 65 07/07/2019     Current Medications:   Current Outpatient Medications (Endocrine & Metabolic):  .  metFORMIN (GLUCOPHAGE XR) 500 MG 24 hr tablet, Take 2 tablets 2 x  day for Diabetes .  Semaglutide,0.25 or 0.5MG/DOS, (OZEMPIC, 0.25 OR 0.5 MG/DOSE,) 2 MG/1.5ML SOPN, Inject 0.5 mg into the skin once a week.  Current Outpatient Medications (Cardiovascular):  .  rosuvastatin (CRESTOR) 10 MG tablet, Take 1 tablet (10 mg total) by mouth at bedtime. Marland Kitchen  spironolactone (ALDACTONE) 50 MG tablet, Take 100 mg in am and 50 mg in pm   Current Outpatient Medications (Analgesics):  .  aspirin 81 MG EC tablet, Take 81 mg by mouth every morning.  Current Outpatient Medications (Hematological):  Marland Kitchen  Cyanocobalamin (B-12 PO), Take 1 tablet by mouth daily. Reported  on 12/14/2015 .  Iron TABS, Take 1 tablet by mouth daily. Reported on 09/08/2015  Current Outpatient Medications (Other):  .  Blood Glucose Monitoring Suppl (ONE TOUCH ULTRA MINI) w/Device KIT, See admin instructions. for testing .  Continuous Blood Gluc Sensor (DEXCOM G6 SENSOR) MISC, 1 Device by Does not apply route continuous. .  Continuous Blood Gluc Transmit (DEXCOM G6 TRANSMITTER) MISC, 1 Device by Does not apply route continuous. .  Magnesium 250 MG TABS, Take by mouth. Marland Kitchen  NOVOFINE 32G X 6 MM MISC, Needs daily for insulin .  omeprazole (PRILOSEC) 40 MG capsule, Take 1 capsule (40 mg total) by mouth daily. .  ONE TOUCH ULTRA TEST test strip, 1 each by Other route 2 (two) times daily. for testing .  Vitamin D, Ergocalciferol, (DRISDOL) 50000 units CAPS capsule, TAKE ONE CAPSULE BY MOUTH EVERY 7 DAYS  Medical History:  Past Medical History:  Diagnosis Date  . Adrenal adenoma   . Allergy   . Anemia   . Diabetes mellitus without complication ([place])   . Endometriosis of rectovaginal septum   . GERD (gastroesophageal reflux disease)   . Headache    MIGRAINES  . Hyperaldosteronism ([place])    Possibly  . Hyperlipidemia   . Hypertension   . Hypokalemia   . Ovarian cyst    Allergies Allergies  Allergen Reactions  . Ace Inhibitors Cough  . Hctz [Hydrochlorothiazide] Cough   Surgical History: reviewed and unchanged Family History: reviewed and unchanged Social History: reviewed and unchanged  Physical Exam: Estimated body mass index is 30.31 kg/m as calculated from the following:   Height as of 09/22/19: 5' 7.5" (1.715 m).   Weight as of this encounter: 196 lb 6.4 oz (89.1 kg). BP 128/86   Pulse 89   Temp (!) 97.5 F (36.4 C)   Wt 196 lb 6.4 oz (89.1 kg)   LMP 05/16/2014   SpO2 100%   BMI 30.31 kg/m  General Appearance: Well nourished, in no apparent distress. Eyes: PERRLA, EOMs, conjunctiva no swelling or erythema, normal fundi and vessels. Sinuses: No Frontal/maxillary  tenderness ENT/Mouth: Ext aud canals clear, normal light reflex with TMs without erythema, bulging.  Good dentition. No erythema, swelling, or exudate on post pharynx. Tonsils not swollen or erythematous. Hearing normal.  Neck: Supple, thyroid normal. No bruits Respiratory: Respiratory effort normal, BS equal bilaterally without rales, rhonchi, wheezing or stridor. Cardio: RRR without murmurs, rubs or gallops. Brisk peripheral pulses without edema.  Chest: symmetric, with normal excursions and percussion. Breasts: defer Abdomen: Soft, +BS. Non tender, no guarding, rebound, hernias, masses, or organomegaly. .  Lymphatics: Non tender without lymphadenopathy.  Genitourinary: defer Musculoskeletal: Full ROM all peripheral extremities,5/5 strength, and normal gait. Skin: Warm, dry without rashes, lesions, ecchymosis.  Neuro: Cranial nerves intact, reflexes equal bilaterally. Normal muscle tone, no cerebellar symptoms. Sensation intact.  Psych: Awake and oriented X 3, normal  affect, Insight and Judgment appropriate.    Vicie Mutters 8:40 AM

## 2019-10-20 ENCOUNTER — Encounter: Payer: Self-pay | Admitting: Physician Assistant

## 2019-10-20 ENCOUNTER — Other Ambulatory Visit: Payer: Self-pay

## 2019-10-20 ENCOUNTER — Ambulatory Visit (INDEPENDENT_AMBULATORY_CARE_PROVIDER_SITE_OTHER): Payer: 59 | Admitting: Physician Assistant

## 2019-10-20 VITALS — BP 128/86 | HR 89 | Temp 97.5°F | Wt 196.4 lb

## 2019-10-20 DIAGNOSIS — E559 Vitamin D deficiency, unspecified: Secondary | ICD-10-CM

## 2019-10-20 DIAGNOSIS — E269 Hyperaldosteronism, unspecified: Secondary | ICD-10-CM

## 2019-10-20 DIAGNOSIS — E785 Hyperlipidemia, unspecified: Secondary | ICD-10-CM

## 2019-10-20 DIAGNOSIS — Z794 Long term (current) use of insulin: Secondary | ICD-10-CM

## 2019-10-20 DIAGNOSIS — E119 Type 2 diabetes mellitus without complications: Secondary | ICD-10-CM

## 2019-10-20 DIAGNOSIS — E1169 Type 2 diabetes mellitus with other specified complication: Secondary | ICD-10-CM | POA: Diagnosis not present

## 2019-10-20 DIAGNOSIS — I10 Essential (primary) hypertension: Secondary | ICD-10-CM

## 2019-10-20 DIAGNOSIS — Z79899 Other long term (current) drug therapy: Secondary | ICD-10-CM | POA: Diagnosis not present

## 2019-10-20 NOTE — Patient Instructions (Addendum)
General eating tips   Check out  Mini habits for weight loss book  2 free apps for tracking food is myfitness pal  loseit  If you want more structured weight loss that you have to pay for, you can look into  [employer]  weight watchers   What to Avoid . Avoid added sugars o Often added sugar can be found in processed foods such as many condiments, dry cereals, cakes, cookies, chips, crisps, crackers, candies, sweetened drinks, etc.  o Read labels and AVOID/DECREASE use of foods with the following in their ingredient list: Sugar, fructose, high fructose corn syrup, sucrose, glucose, maltose, dextrose, molasses, cane sugar, brown sugar, any type of syrup, agave nectar, etc.   . Avoid snacking in between meals- drink water or if you feel you need a snack, pick a high water content snack such as cucumbers, watermelon, or any veggie.  Marland Kitchen Avoid foods made with flour o If you are going to eat food made with flour, choose those made with whole-grains; and, minimize your consumption as much as is tolerable . Avoid processed foods o These foods are generally stocked in the middle of the grocery store.  o Focus on shopping on the perimeter of the grocery.  What to Include . Vegetables o GREEN LEAFY VEGETABLES: Kale, spinach, mustard greens, collard greens, cabbage, broccoli, etc. o OTHER: Asparagus, cauliflower, eggplant, carrots, peas, Brussel sprouts, tomatoes, bell peppers, zucchini, beets, cucumbers, etc. . Grains, seeds, and legumes o Beans: kidney beans, black eyed peas, garbanzo beans, black beans, pinto beans, etc. o Whole, unrefined grains: brown rice, barley, bulgur, oatmeal, etc. . Healthy fats  o Avoid highly processed fats such as vegetable oil o Examples of healthy fats: avocado, olives, virgin olive oil, dark chocolate (?72% Cocoa), nuts (peanuts, almonds, walnuts, cashews, pecans, etc.) o Please still do small amount of these healthy fats, they are dense in calories.  . Low -  Moderate Intake of Animal Sources of Protein o Meat sources: chicken, [place], salmon, tuna. Limit to 4 ounces of meat at one time or the size of your palm. o Consider limiting dairy sources, but when choosing dairy focus on: PLAIN [place] yogurt, cottage cheese, high-protein milk . Fruit o Choose berries

## 2019-10-21 LAB — MAGNESIUM: Magnesium: 1.8 mg/dL (ref 1.5–2.5)

## 2019-10-21 LAB — COMPLETE METABOLIC PANEL WITH GFR
AG Ratio: 1.6 (calc) (ref 1.0–2.5)
ALT: 29 U/L (ref 6–29)
AST: 18 U/L (ref 10–35)
Albumin: 4.4 g/dL (ref 3.6–5.1)
Alkaline phosphatase (APISO): 90 U/L (ref 37–153)
BUN: 9 mg/dL (ref 7–25)
CO2: 28 mmol/L (ref 20–32)
Calcium: 9.4 mg/dL (ref 8.6–10.4)
Chloride: 101 mmol/L (ref 98–110)
Creat: 0.71 mg/dL (ref 0.50–1.05)
GFR, Est African American: 112 mL/min/{1.73_m2} (ref >=60)
GFR, Est Non African American: 97 mL/min/{1.73_m2} (ref >=60)
Globulin: 2.7 g/dL (calc) (ref 1.9–3.7)
Glucose, Bld: 226 mg/dL — ABNORMAL HIGH (ref 65–99)
Potassium: 4.2 mmol/L (ref 3.5–5.3)
Sodium: 136 mmol/L (ref 135–146)
Total Bilirubin: 0.5 mg/dL (ref 0.2–1.2)
Total Protein: 7.1 g/dL (ref 6.1–8.1)

## 2019-10-21 LAB — CBC WITH DIFFERENTIAL/PLATELET
Absolute Monocytes: 409 cells/uL (ref 200–950)
Basophils Absolute: 40 cells/uL (ref 0–200)
Basophils Relative: 0.6 %
Eosinophils Absolute: 73 cells/uL (ref 15–500)
Eosinophils Relative: 1.1 %
HCT: 40.2 % (ref 35.0–45.0)
Hemoglobin: 12.9 g/dL (ref 11.7–15.5)
Lymphs Abs: 2013 cells/uL (ref 850–3900)
MCH: 27.3 pg (ref 27.0–33.0)
MCHC: 32.1 g/dL (ref 32.0–36.0)
MCV: 85.2 fL (ref 80.0–100.0)
MPV: 11.6 fL (ref 7.5–12.5)
Monocytes Relative: 6.2 %
Neutro Abs: 4066 cells/uL (ref 1500–7800)
Neutrophils Relative %: 61.6 %
Platelets: 206 10*3/uL (ref 140–400)
RBC: 4.72 10*6/uL (ref 3.80–5.10)
RDW: 12.7 % (ref 11.0–15.0)
Total Lymphocyte: 30.5 %
WBC: 6.6 10*3/uL (ref 3.8–10.8)

## 2019-10-21 LAB — HEMOGLOBIN A1C
Hgb A1c MFr Bld: 8.3 % of total Hgb — ABNORMAL HIGH (ref <5.7)
Mean Plasma Glucose: 192 (calc)
eAG (mmol/L): 10.6 (calc)

## 2019-10-21 LAB — LIPID PANEL
Cholesterol: 161 mg/dL (ref <200)
HDL: 63 mg/dL (ref >=50)
LDL Cholesterol (Calc): 83 mg/dL (calc)
Non-HDL Cholesterol (Calc): 98 mg/dL (calc) (ref <130)
Total CHOL/HDL Ratio: 2.6 (calc) (ref <5.0)
Triglycerides: 70 mg/dL (ref <150)

## 2019-10-21 LAB — TSH: TSH: 1.93 mIU/L

## 2019-10-21 LAB — VITAMIN D 25 HYDROXY (VIT D DEFICIENCY, FRACTURES): Vit D, 25-Hydroxy: 80 ng/mL (ref 30–100)

## 2019-11-07 ENCOUNTER — Other Ambulatory Visit: Payer: Self-pay | Admitting: Physician Assistant

## 2019-12-09 ENCOUNTER — Other Ambulatory Visit: Payer: Self-pay | Admitting: Physician Assistant

## 2019-12-22 DIAGNOSIS — Z0100 Encounter for examination of eyes and vision without abnormal findings: Secondary | ICD-10-CM | POA: Diagnosis not present

## 2019-12-29 MED ORDER — OZEMPIC (1 MG/DOSE) 2 MG/1.5ML ~~LOC~~ SOPN: 1.0000 mg | PEN_INJECTOR | SUBCUTANEOUS | 3 refills | Status: DC

## 2020-01-08 ENCOUNTER — Other Ambulatory Visit: Payer: Self-pay | Admitting: Physician Assistant

## 2020-01-10 ENCOUNTER — Other Ambulatory Visit: Payer: Self-pay | Admitting: Physician Assistant

## 2020-01-21 NOTE — Progress Notes (Signed)
FOLLOW UP VISIT  Assessment and Plan:  Essential hypertension - continue medications, DASH diet, exercise and monitor at home. Call if greater than 130/80.  -     CBC with Differential/Platelet -     Hepatic function panel -     TSH -     BASIC METABOLIC PANEL WITH GFR  Diabetes mellitus with hyperlipemia ([place]) Discussed general issues about diabetes pathophysiology and management., Educational material distributed., Suggested low cholesterol diet., Encouraged aerobic exercise., Discussed foot care., Reminded to get yearly retinal exam. 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  -     Lipid panel   Discussed med's effects and SE's. Screening labs and tests as requested with regular follow-up as recommended. Future Appointments  Date Time Provider [place] 05/03/2020 10:30 AM CVicie Mutters PA-C [facility] None  08/06/2020 11:00 AM CVicie Mutters PA-C [facility] None    HPI 54y.o. female presents for follow up for HTN, chol, DM2.   She has been working on diet and exercise for diabetes  With hyperlipidemia- crestor 5 mg last visit Has nueuropathy She is on metformin - added back ozempic 1MG DAILY- will get some stomach discomfort intermittent between doses- states will feel in the area that she takes the shot- feels more muscular Has dexcom 6- JUST GOT RENEWED going to start wearing again she is on bASA she is not on ACE/ARB she was following with Dr. GDarnell Level But she has not been back denies foot ulcerations, nausea, paresthesia of the feet and visual disturbances.   Last A1C in the office was:  Lab Results  Component  Value Date   HGBA1C 8.3 (H) 10/20/2019    Has possible history of aldosteronism, follows with Dr. GDarnell Levelfor this and DM, on spirolactone 550mBID and tolerates well.  Lab Results  Component Value Date   GFRAA 112 10/20/2019    Her blood pressure has been controlled at home, today their BP is BP: 126/84 She does workout, she has been walking. She denies chest pain, shortness of breath, dizziness.  BMI is Body mass index is 29.78 kg/m., she is working on diet and exercise. Her mom passed in Dec. She states her divorce is final but there is still unresolved issues with property.  Wt Readings from Last 3 Encounters:  01/22/20 193 lb (87.5 kg)  10/20/19 196 lb 6.4 oz (89.1 kg)  09/22/19 190 lb (86.2 kg)   She is on cholesterol medication, she is on crestor 1062mut on 1/2 a day and denies myalgias. Her cholesterol is not at goal, LDL of 70. The cholesterol last visit was:   Lab Results  Component Value Date   CHOL 161 10/20/2019   HDL 63 10/20/2019   LDLCALC 83 10/20/2019   TRIG 70 10/20/2019   CHOLHDL 2.6 10/20/2019   Patient is on Vitamin D supplement, 1000 daily. Lab Results  Component Value Date   VD25OH 80 10/20/2019     She is not on iron, she has been having more muscle aches/joint pain.  Lab Results  Component Value Date   IRON 101 10/05/2015   TIBC 368 10/05/2015   FERRITIN  156 10/05/2015    Lab Results  Component Value Date   [id number] 673 04/05/2016    Current Medications:   Current Outpatient Medications (Endocrine & Metabolic):  .  metFORMIN (GLUCOPHAGE-XR) 500 MG 24 hr tablet, TAKE 2 TABLETS BY MOUTH TWICE A DAY FOR DIABETES .  Semaglutide, 1 MG/DOSE, (OZEMPIC, 1 MG/DOSE,) 2 MG/1.5ML SOPN, Inject 0.75 mLs (1 mg total) into the skin once a week.  Current Outpatient Medications (Cardiovascular):  .  rosuvastatin (CRESTOR) 10 MG tablet, Take 1 tablet (10 mg total) by mouth at bedtime. .  rosuvastatin (CRESTOR) 5 MG tablet, TAKE 1 TABLET BY MOUTH EVERYDAY AT  BEDTIME .  spironolactone (ALDACTONE) 50 MG tablet, Take 100 mg in am and 50 mg in pm   Current Outpatient Medications (Analgesics):  .  aspirin 81 MG EC tablet, Take 81 mg by mouth every morning.  Current Outpatient Medications (Hematological):  Marland Kitchen  Cyanocobalamin (B-12 PO), Take 1 tablet by mouth daily. Reported on 12/14/2015 .  Iron TABS, Take 1 tablet by mouth daily. Reported on 09/08/2015  Current Outpatient Medications (Other):  .  Blood Glucose Monitoring Suppl (ONE TOUCH ULTRA MINI) w/Device KIT, See admin instructions. for testing .  Continuous Blood Gluc Sensor (DEXCOM G6 SENSOR) MISC, USE AS DIRECTED .  Continuous Blood Gluc Transmit (DEXCOM G6 TRANSMITTER) MISC, 1 Device by Does not apply route continuous. .  Magnesium 250 MG TABS, Take by mouth. Marland Kitchen  NOVOFINE 32G X 6 MM MISC, Needs daily for insulin .  omeprazole (PRILOSEC) 40 MG capsule, Take 1 capsule (40 mg total) by mouth daily. .  ONE TOUCH ULTRA TEST test strip, 1 each by Other route 2 (two) times daily. for testing .  Vitamin D, Ergocalciferol, (DRISDOL) 1.25 MG (50000 UNIT) CAPS capsule, TAKE 1 CAPSULE BY MOUTH EVERY 7 DAYS  Medical History:  Past Medical History:  Diagnosis Date  . Adrenal adenoma   . Allergy   . Anemia   . Diabetes mellitus without complication ([place])   . Endometriosis of rectovaginal septum   . GERD (gastroesophageal reflux disease)   . Headache    MIGRAINES  . Hyperaldosteronism ([place])    Possibly  . Hyperlipidemia   . Hypertension   . Hypokalemia   . Ovarian cyst    Allergies Allergies  Allergen Reactions  . Ace Inhibitors Cough  . Hctz [Hydrochlorothiazide] Cough   Surgical History: reviewed and unchanged Family History: reviewed and unchanged Social History: reviewed and unchanged  Physical Exam: Estimated body mass index is 29.78 kg/m as calculated from the following:   Height as of 09/22/19: 5' 7.5" (1.715 m).   Weight as of this encounter: 193 lb (87.5 kg). BP 126/84    Pulse 67   Temp (!) 97.5 F (36.4 C)   Wt 193 lb (87.5 kg)   LMP 05/16/2014   SpO2 99%   BMI 29.78 kg/m  General Appearance: Well nourished, in no apparent distress. Eyes: PERRLA, EOMs, conjunctiva no swelling or erythema, normal fundi and vessels. Sinuses: No Frontal/maxillary tenderness ENT/Mouth: Ext aud canals clear, normal light reflex with TMs without erythema, bulging.  Good dentition. No erythema, swelling, or exudate on post pharynx. Tonsils not swollen or erythematous. Hearing normal.  Neck: Supple, thyroid normal. No bruits Respiratory: Respiratory effort normal, BS equal bilaterally without rales, rhonchi, wheezing or stridor. Cardio: RRR without murmurs, rubs or gallops. Brisk peripheral pulses without edema.  Chest: symmetric, with normal excursions and percussion. Breasts: defer Abdomen: Soft, +BS. Non tender, no guarding, rebound,  hernias, masses, or organomegaly. .  Lymphatics: Non tender without lymphadenopathy.  Genitourinary: defer Musculoskeletal: Full ROM all peripheral extremities,5/5 strength, and normal gait. Skin: Warm, dry without rashes, lesions, ecchymosis.  Neuro: Cranial nerves intact, reflexes equal bilaterally. Normal muscle tone, no cerebellar symptoms. Sensation intact.  Psych: Awake and oriented X 3, normal affect, Insight and Judgment appropriate.    Vicie Mutters 12:50 PM

## 2020-01-22 ENCOUNTER — Ambulatory Visit (INDEPENDENT_AMBULATORY_CARE_PROVIDER_SITE_OTHER): Payer: No Typology Code available for payment source | Admitting: Physician Assistant

## 2020-01-22 ENCOUNTER — Other Ambulatory Visit: Payer: Self-pay

## 2020-01-22 ENCOUNTER — Encounter: Payer: Self-pay | Admitting: Physician Assistant

## 2020-01-22 VITALS — BP 126/84 | HR 67 | Temp 97.5°F | Wt 193.0 lb

## 2020-01-22 DIAGNOSIS — Z79899 Other long term (current) drug therapy: Secondary | ICD-10-CM | POA: Diagnosis not present

## 2020-01-22 DIAGNOSIS — E1169 Type 2 diabetes mellitus with other specified complication: Secondary | ICD-10-CM | POA: Diagnosis not present

## 2020-01-22 DIAGNOSIS — Z794 Long term (current) use of insulin: Secondary | ICD-10-CM | POA: Diagnosis not present

## 2020-01-22 DIAGNOSIS — E269 Hyperaldosteronism, unspecified: Secondary | ICD-10-CM

## 2020-01-22 DIAGNOSIS — E559 Vitamin D deficiency, unspecified: Secondary | ICD-10-CM | POA: Diagnosis not present

## 2020-01-22 DIAGNOSIS — Z130 Encounter for screening for diseases of the blood and blood-forming organs and certain disorders involving the immune mechanism: Secondary | ICD-10-CM

## 2020-01-22 DIAGNOSIS — E119 Type 2 diabetes mellitus without complications: Secondary | ICD-10-CM

## 2020-01-22 DIAGNOSIS — E785 Hyperlipidemia, unspecified: Secondary | ICD-10-CM | POA: Diagnosis not present

## 2020-01-22 DIAGNOSIS — I10 Essential (primary) hypertension: Secondary | ICD-10-CM

## 2020-01-22 NOTE — Patient Instructions (Addendum)
I would check on other injectables, is there a preferred  If not, can look into or try a SGLT-2 inhibitor like farxiga or Jardiance- see if they cover one of these Here is info   STARTING A DIABETIC MEDICATION  The medication we are going to start you on is a sodium-glucose cotransporter-2 (SGLT2) inhibitors for your diabetes and weight.   This can decrease your blood pressure so please contact us if you have any dizziness, sometime we need to decrease or stop fluid pills or decrease BP meds.  You are peeing out 300-400 calories a day of sugar which can lead to yeast infections, you can take the diflucan as needed but if the infections continue we will stop the medications.  If you get any pain or discomfort around your buttocks or genitals please let us know if it does not go away. We will have you follow up in 1 month to check your kidney function and weight. Call if you need anything.      When it comes to diets, agreement about the perfect plan isn't easy to find, even among the experts. Experts at the [place] developed an idea known as the Healthy Eating Plate. Just imagine a plate divided into logical, healthy portions.  The emphasis is on diet quality:  Load up on vegetables and fruits - one-half of your plate: Aim for color and variety, and remember that potatoes don't count.  Go for whole grains - one-quarter of your plate: Whole wheat, barley, wheat berries, quinoa, oats, brown rice, and foods made with them. If you want pasta, go with whole wheat pasta.  Protein power - one-quarter of your plate: Fish, chicken, beans, and nuts are all healthy, versatile protein sources. Limit red meat.  The diet, however, does go beyond the plate, offering a few other suggestions.  Use healthy plant oils, such as olive, canola, soy, corn, sunflower and peanut. Check the labels, and avoid partially hydrogenated oil, which have unhealthy trans fats.  If you're thirsty,  drink water. Coffee and tea are good in moderation, but skip sugary drinks and limit milk and dairy products to one or two daily servings.  The type of carbohydrate in the diet is more important than the amount. Some sources of carbohydrates, such as vegetables, fruits, whole grains, and beans--are healthier than others.  Finally, stay active.  Here is some information about the vaccines. The data is very good and this information hopefully answers a lot of your questions and give you a confidence boost.   The Pfizer and Moderna vaccines are messenger RNA vaccines. That technology is not new, it has been studied for 20 years at least, used for cancer and MS treatment. They had started using the vaccine for MERS AND SARS (both a different coronavirus) 10 years ago but never finished so we had a good backbone for this vaccine.   There were no short cuts with the techniques for these clinical  trials, just lots of willing participants quickly and lots of up front money helped speed up the process.   The mRNA is very fragile which is why it needs to be kept so cold and thawed a certain way, think of it as a message in a glass bottle. NO PART of the virus is in this vaccine, it is a clip of the genetic sequence. This mRNA is injected in your arm, connects with a ribosome, delivers the message and the degrades.  That is part of  the cause of the sore arm, the mRNA never leaves your arm. It degrades there. The mRNA does not go into our nucleotide where our DNA is, and we would need a DNA reverse transcriptase to take RNA to DNA, we do not have this, it can not change our DNA. The ribosome that got the message creates a protein and that protein circulates in our body and we have an immune reaction to that creating antibodies. Any time our immune system is triggered, inflammation is triggered too so you can have a temp, muscle aches, etc. Normal reaction.  I have seen so many patients that just had mild COVID in  the office weeks later still have issues. We are still learning about post COVID syndrome, the CDC should be coming out for guidelines for practioners soon. There is too much unknown about COVID. We have been using vaccines for over 100 years or more, i Personnel officer. Ask your parents or any older friends about polio and that vaccine, that was a disease shutting down schools,had kids in iron lung, devastating young kids and families. People lined up for that vaccine and technology has only improved.   Please get the vaccine. If you have any further questions please make an appointment in the office to discuss further.  Estill Bamberg

## 2020-01-23 LAB — CBC WITH DIFFERENTIAL/PLATELET
Absolute Monocytes: 406 cells/uL (ref 200–950)
Basophils Absolute: 42 cells/uL (ref 0–200)
Basophils Relative: 0.6 %
Eosinophils Absolute: 28 cells/uL (ref 15–500)
Eosinophils Relative: 0.4 %
HCT: 41.8 % (ref 35.0–45.0)
Hemoglobin: 13.5 g/dL (ref 11.7–15.5)
Lymphs Abs: 2135 cells/uL (ref 850–3900)
MCH: 27.2 pg (ref 27.0–33.0)
MCHC: 32.3 g/dL (ref 32.0–36.0)
MCV: 84.1 fL (ref 80.0–100.0)
MPV: 11.2 fL (ref 7.5–12.5)
Monocytes Relative: 5.8 %
Neutro Abs: 4389 cells/uL (ref 1500–7800)
Neutrophils Relative %: 62.7 %
Platelets: 187 10*3/uL (ref 140–400)
RBC: 4.97 10*6/uL (ref 3.80–5.10)
RDW: 12.6 % (ref 11.0–15.0)
Total Lymphocyte: 30.5 %
WBC: 7 10*3/uL (ref 3.8–10.8)

## 2020-01-23 LAB — LIPID PANEL
Cholesterol: 159 mg/dL (ref <200)
HDL: 58 mg/dL (ref >=50)
LDL Cholesterol (Calc): 86 mg/dL (calc)
Non-HDL Cholesterol (Calc): 101 mg/dL (calc) (ref <130)
Total CHOL/HDL Ratio: 2.7 (calc) (ref <5.0)
Triglycerides: 68 mg/dL (ref <150)

## 2020-01-23 LAB — COMPLETE METABOLIC PANEL WITH GFR
AG Ratio: 1.7 (calc) (ref 1.0–2.5)
ALT: 19 U/L (ref 6–29)
AST: 13 U/L (ref 10–35)
Albumin: 4.7 g/dL (ref 3.6–5.1)
Alkaline phosphatase (APISO): 90 U/L (ref 37–153)
BUN: 15 mg/dL (ref 7–25)
CO2: 25 mmol/L (ref 20–32)
Calcium: 9.9 mg/dL (ref 8.6–10.4)
Chloride: 104 mmol/L (ref 98–110)
Creat: 0.81 mg/dL (ref 0.50–1.05)
GFR, Est African American: 95 mL/min/{1.73_m2} (ref >=60)
GFR, Est Non African American: 82 mL/min/{1.73_m2} (ref >=60)
Globulin: 2.8 g/dL (calc) (ref 1.9–3.7)
Glucose, Bld: 102 mg/dL — ABNORMAL HIGH (ref 65–99)
Potassium: 4.1 mmol/L (ref 3.5–5.3)
Sodium: 139 mmol/L (ref 135–146)
Total Bilirubin: 0.7 mg/dL (ref 0.2–1.2)
Total Protein: 7.5 g/dL (ref 6.1–8.1)

## 2020-01-23 LAB — HEMOGLOBIN A1C
Hgb A1c MFr Bld: 8.3 % of total Hgb — ABNORMAL HIGH (ref <5.7)
Mean Plasma Glucose: 192 (calc)
eAG (mmol/L): 10.6 (calc)

## 2020-01-23 LAB — TSH: TSH: 2.18 mIU/L

## 2020-01-23 LAB — IRON, TOTAL/TOTAL IRON BINDING CAP
%SAT: 21 % (calc) (ref 16–45)
Iron: 76 ug/dL (ref 45–160)
TIBC: 356 mcg/dL (calc) (ref 250–450)

## 2020-01-23 LAB — MAGNESIUM: Magnesium: 2.1 mg/dL (ref 1.5–2.5)

## 2020-03-24 ENCOUNTER — Other Ambulatory Visit: Payer: Self-pay | Admitting: Adult Health

## 2020-03-24 MED ORDER — METFORMIN HCL ER 500 MG PO TB24: ORAL_TABLET | ORAL | 99 refills | Status: DC

## 2020-05-03 ENCOUNTER — Ambulatory Visit: Payer: No Typology Code available for payment source | Admitting: Adult Health

## 2020-05-14 ENCOUNTER — Ambulatory Visit (INDEPENDENT_AMBULATORY_CARE_PROVIDER_SITE_OTHER): Payer: No Typology Code available for payment source | Admitting: Adult Health Nurse Practitioner

## 2020-05-14 ENCOUNTER — Encounter: Payer: Self-pay | Admitting: Adult Health Nurse Practitioner

## 2020-05-14 ENCOUNTER — Other Ambulatory Visit: Payer: Self-pay

## 2020-05-14 VITALS — BP 126/80 | HR 63 | Temp 97.3°F | Wt 192.6 lb

## 2020-05-14 DIAGNOSIS — E269 Hyperaldosteronism, unspecified: Secondary | ICD-10-CM | POA: Diagnosis not present

## 2020-05-14 DIAGNOSIS — Z79899 Other long term (current) drug therapy: Secondary | ICD-10-CM

## 2020-05-14 DIAGNOSIS — E559 Vitamin D deficiency, unspecified: Secondary | ICD-10-CM | POA: Diagnosis not present

## 2020-05-14 DIAGNOSIS — E1169 Type 2 diabetes mellitus with other specified complication: Secondary | ICD-10-CM

## 2020-05-14 DIAGNOSIS — I10 Essential (primary) hypertension: Secondary | ICD-10-CM | POA: Diagnosis not present

## 2020-05-14 DIAGNOSIS — E785 Hyperlipidemia, unspecified: Secondary | ICD-10-CM | POA: Diagnosis not present

## 2020-05-14 MED ORDER — INSULIN DETEMIR 100 UNIT/ML FLEXPEN: PEN_INJECTOR | SUBCUTANEOUS | 11 refills | Status: DC

## 2020-05-14 MED ORDER — NOVOFINE PEN NEEDLE 32G X 6 MM MISC: 1.0000 [IU] | Freq: Every day | 2 refills | Status: DC

## 2020-05-14 NOTE — Progress Notes (Signed)
FOLLOW UP VISIT   Assessment and Plan:  Essential hypertension - continue medications, DASH diet, exercise and monitor at home. Call if greater than 130/80.  -     CBC with Differential/Platelet -     Hepatic function panel -     TSH -     BASIC METABOLIC PANEL WITH GFR  Diabetes mellitus with hyperlipemia ([place]) Discussed general issues about diabetes pathophysiology and management., Educational material distributed., Suggested low cholesterol diet., Encouraged aerobic exercise., Discussed foot care., Reminded to get yearly retinal exam. 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  -     Lipid panel  Vitamin D Defciency Continue supplementation to maintain goal of 70-100 Defer vitamin D level  Medication Management Continued   Discussed med's effects and SE's. Screening labs and tests as requested with regular follow-up as recommended.  Further disposition pending results if labs check today. Discussed med's effects and SE's.   Over 30 minutes of face to face interview, exam, counseling, chart review, and critical decision making was performed.   Future Appointments  Date Time Provider [place] 05/14/2020  9:30 AM MGarnet Sierras NP [facility] None  08/27/2020 11:00 AM CLiane Comber NP [facility] None    HPI 54y.o. female presents for follow up for HTN, HLD, DM2.   She has been working on diet and exercise.  She tried Ozempic for about eight months.  She started having joint pain and has since stopped the medication and shortly after the joint pains resolved.   She is taking Levemir 14units daily in the morning.  She is taking two Metformin at  night.  She has been working on diet and exercise for diabetes  With hyperlipidemia- Rosuvastatin 5 mg last visit Has nueuropathy She is on metformin XR 5057mBID - added back ozempic 1MG DAILY- will get some stomach discomfort intermittent between doses- states will feel in the area that she takes the shot- feels more muscular Has dexcom 6 and has used for 8-10 months.  She is going to get off of this after use felt like it was ont consistant with her readings.  She is on bASA she is not on ACE/ARB she was following with Dr. G.Darnell LevelBut she has not been back denies foot ulcerations, nausea, paresthesia of the feet and visual disturbances.   Last A1C in the office was:  Lab Results  Component Value Date   HGBA1C 8.3 (H) 01/22/2020    Has possible history of aldosteronism, follows with Dr. G Darnell Levelor this and DM, on spirolactone 5056mID and tolerates well.  Lab Results  Component Value Date   GFRAA 95 01/22/2020    Her blood pressure has been controlled at home, today their BP is   She does workout, she has been walking. She denies chest pain, shortness of breath, dizziness.  BMI is There is no height or weight on file to calculate BMI., she is working on diet and exercise. Her mom passed in Dec. She states her divorce is final but there is still unresolved issues with property.  Wt Readings from Last 3 Encounters:  01/22/20 193 lb (87.5 kg)  10/20/19 196 lb 6.4 oz (89.1 kg)  09/22/19 190 lb (86.2 kg)   She is on cholesterol medication, she is on crestor 67m but on 1/2 a day and denies myalgias. Her cholesterol is not at goal, LDL of 70. The cholesterol last visit was:   Lab Results  Component Value Date   CHOL 159 01/22/2020   HDL 58 01/22/2020   LDLCALC 86 01/22/2020   TRIG 68 01/22/2020   CHOLHDL 2.7 01/22/2020   Patient is on Vitamin D supplement, 1000 daily. Lab Results  Component Value Date   VD25OH 80 10/20/2019     She is not on iron, she has been having more muscle  aches/joint pain.  Lab Results  Component Value Date   IRON 76 01/22/2020   TIBC 356 01/22/2020   FERRITIN 156 10/05/2015    Lab Results  Component Value Date   VITAMINB12 445 04/05/2016    Current Medications:   Current Outpatient Medications (Endocrine & Metabolic):    metFORMIN (GLUCOPHAGE-XR) 500 MG 24 hr tablet, TAKE 1 TABLET BY MOUTH TWICE A DAY WITH MEAL AS TOLERATED, FOR DIABETES   Semaglutide, 1 MG/DOSE, (OZEMPIC, 1 MG/DOSE,) 2 MG/1.5ML SOPN, Inject 0.75 mLs (1 mg total) into the skin once a week.  Current Outpatient Medications (Cardiovascular):    rosuvastatin (CRESTOR) 10 MG tablet, Take 1 tablet (10 mg total) by mouth at bedtime.   rosuvastatin (CRESTOR) 5 MG tablet, TAKE 1 TABLET BY MOUTH EVERYDAY AT BEDTIME   spironolactone (ALDACTONE) 50 MG tablet, Take 100 mg in am and 50 mg in pm   Current Outpatient Medications (Analgesics):    aspirin 81 MG EC tablet, Take 81 mg by mouth every morning.  Current Outpatient Medications (Hematological):    Cyanocobalamin (B-12 PO), Take 1 tablet by mouth daily. Reported on 12/14/2015   Iron TABS, Take 1 tablet by mouth daily. Reported on 09/08/2015  Current Outpatient Medications (Other):    Blood Glucose Monitoring Suppl (ONE TOUCH ULTRA MINI) w/Device KIT, See admin instructions. for testing   Continuous Blood Gluc Sensor (DEXCOM G6 SENSOR) MISC, USE AS DIRECTED   Continuous Blood Gluc Transmit (DEXCOM G6 TRANSMITTER) MISC, 1 Device by Does not apply route continuous.   Magnesium 250 MG TABS, Take by mouth.   NOVOFINE 32G X 6 MM MISC, Needs daily for insulin   omeprazole (PRILOSEC) 40 MG capsule, Take 1 capsule (40 mg total) by mouth daily.   ONE TOUCH ULTRA TEST test strip, 1 each by Other route 2 (two) times daily. for testing   Vitamin D, Ergocalciferol, (DRISDOL) 1.25 MG (50000 UNIT) CAPS capsule, TAKE 1 CAPSULE BY MOUTH EVERY 7 DAYS  Medical History:  Past Medical History:  Diagnosis Date   Adrenal  adenoma    Allergy    Anemia    Diabetes mellitus without complication (HCC)    Endometriosis of rectovaginal septum    GERD (gastroesophageal reflux disease)    Headache    MIGRAINES   Hyperaldosteronism (HCC)    Possibly   Hyperlipidemia    Hypertension    Hypokalemia    Ovarian cyst    Allergies Allergies  Allergen Reactions   Ace Inhibitors Cough   Hctz [Hydrochlorothiazide] Cough   Surgical History: reviewed and unchanged Family History: reviewed and unchanged Social History: reviewed and unchanged   Physical Exam: Estimated body mass index is 29.78 kg/m as calculated from the following:   Height as of 09/22/19: 5' 7.5" (1.715 m).   Weight as of 01/22/20: 193 lb (87.5 kg). LMP 05/16/2014    General  Appearance: Well nourished, in no apparent distress. Eyes: PERRLA, EOMs, conjunctiva no swelling or erythema, normal fundi and vessels. Sinuses: No Frontal/maxillary tenderness ENT/Mouth: Ext aud canals clear, normal light reflex with TMs without erythema, bulging.  Good dentition. No erythema, swelling, or exudate on post pharynx. Tonsils not swollen or erythematous. Hearing normal.  Neck: Supple, thyroid normal. No bruits Respiratory: Respiratory effort normal, BS equal bilaterally without rales, rhonchi, wheezing or stridor. Cardio: RRR without murmurs, rubs or gallops. Brisk peripheral pulses without edema.  Chest: symmetric, with normal excursions and percussion. Breasts: defer Abdomen: Soft, +BS. Non tender, no guarding, rebound, hernias, masses, or organomegaly. .  Lymphatics: Non tender without lymphadenopathy.  Genitourinary: defer Musculoskeletal: Full ROM all peripheral extremities,5/5 strength, and normal gait. Skin: Warm, dry without rashes, lesions, ecchymosis.  Neuro: Cranial nerves intact, reflexes equal bilaterally. Normal muscle tone, no cerebellar symptoms. Sensation intact.  Psych: Awake and oriented X 3, normal affect, Insight and  Judgment appropriate.     Garnet Sierras, Laqueta Jean, DNP [facility] 05/14/2020  9:10 AM

## 2020-05-14 NOTE — Patient Instructions (Signed)
  Keep up the good work with your diet and medication regimen.  We will respond via MyChart with your lab results.  Let me know when you need a refill of your insulin.  A refill for insulin pen needles has been sent to your pharmacy.    GENERAL HEALTH GOALS  Know what a healthy weight is for you (roughly BMI <25) and aim to maintain this  Aim for 7+ servings of fruits and vegetables daily  70-80+ fluid ounces of water or unsweet tea for healthy kidneys  Limit to max 1 drink of alcohol per day; avoid smoking/tobacco  Limit animal fats in diet for cholesterol and heart health - choose grass fed whenever available  Avoid highly processed foods, and foods high in saturated/trans fats  Aim for low stress - take time to unwind and care for your mental health  Aim for 150 min of moderate intensity exercise weekly for heart health, and weights twice weekly for bone health  Aim for 7-9 hours of sleep daily

## 2020-05-15 LAB — LIPID PANEL
Cholesterol: 173 mg/dL (ref <200)
HDL: 65 mg/dL (ref >=50)
LDL Cholesterol (Calc): 93 mg/dL (calc)
Non-HDL Cholesterol (Calc): 108 mg/dL (calc) (ref <130)
Total CHOL/HDL Ratio: 2.7 (calc) (ref <5.0)
Triglycerides: 67 mg/dL (ref <150)

## 2020-05-15 LAB — COMPLETE METABOLIC PANEL WITH GFR
AG Ratio: 1.6 (calc) (ref 1.0–2.5)
ALT: 20 U/L (ref 6–29)
AST: 15 U/L (ref 10–35)
Albumin: 4.5 g/dL (ref 3.6–5.1)
Alkaline phosphatase (APISO): 81 U/L (ref 37–153)
BUN: 9 mg/dL (ref 7–25)
CO2: 26 mmol/L (ref 20–32)
Calcium: 9.6 mg/dL (ref 8.6–10.4)
Chloride: 105 mmol/L (ref 98–110)
Creat: 0.79 mg/dL (ref 0.50–1.05)
GFR, Est African American: 98 mL/min/{1.73_m2} (ref >=60)
GFR, Est Non African American: 84 mL/min/{1.73_m2} (ref >=60)
Globulin: 2.8 g/dL (calc) (ref 1.9–3.7)
Glucose, Bld: 143 mg/dL — ABNORMAL HIGH (ref 65–99)
Potassium: 4.1 mmol/L (ref 3.5–5.3)
Sodium: 140 mmol/L (ref 135–146)
Total Bilirubin: 0.5 mg/dL (ref 0.2–1.2)
Total Protein: 7.3 g/dL (ref 6.1–8.1)

## 2020-05-15 LAB — CBC WITH DIFFERENTIAL/PLATELET
Absolute Monocytes: 390 cells/uL (ref 200–950)
Basophils Absolute: 38 cells/uL (ref 0–200)
Basophils Relative: 0.5 %
Eosinophils Absolute: 53 cells/uL (ref 15–500)
Eosinophils Relative: 0.7 %
HCT: 42 % (ref 35.0–45.0)
Hemoglobin: 13.6 g/dL (ref 11.7–15.5)
Lymphs Abs: 1778 cells/uL (ref 850–3900)
MCH: 27.4 pg (ref 27.0–33.0)
MCHC: 32.4 g/dL (ref 32.0–36.0)
MCV: 84.7 fL (ref 80.0–100.0)
MPV: 11.2 fL (ref 7.5–12.5)
Monocytes Relative: 5.2 %
Neutro Abs: 5243 cells/uL (ref 1500–7800)
Neutrophils Relative %: 69.9 %
Platelets: 223 10*3/uL (ref 140–400)
RBC: 4.96 10*6/uL (ref 3.80–5.10)
RDW: 12.4 % (ref 11.0–15.0)
Total Lymphocyte: 23.7 %
WBC: 7.5 10*3/uL (ref 3.8–10.8)

## 2020-05-15 LAB — HEMOGLOBIN A1C
Hgb A1c MFr Bld: 8.1 % of total Hgb — ABNORMAL HIGH (ref <5.7)
Mean Plasma Glucose: 186 mg/dL
eAG (mmol/L): 10.3 mmol/L

## 2020-06-24 MED ORDER — SPIRONOLACTONE 50 MG PO TABS: ORAL_TABLET | ORAL | 3 refills | Status: DC

## 2020-07-01 ENCOUNTER — Encounter: Payer: 59 | Admitting: Physician Assistant

## 2020-07-08 ENCOUNTER — Encounter: Payer: 59 | Admitting: Physician Assistant

## 2020-07-23 NOTE — Progress Notes (Signed)
Assessment and Plan:  Valaria was seen today for neck pain.  Diagnoses and all orders for this visit:  Neck pain Non acute, however persistent, check xrays with hx of cervical DDD for significant changes; no radicular sx, does retain intact ROM Her sx appear primarily muscular on exam today;  Continue tylenol PRN; declined muscle relaxer; she is interested in working with PT  Pending cervical xray will place referral  -     DG Cervical Spine Complete; Future  Episodic tension-type headache, not intractable Suspect tension type HA r/t above; she is due for vision check, encouraged to schedule Tylenol is working well  Benign neuro exam today  Try heat to neck muscles, massage, ROM Per above will refer for PT/dry needling Follow up if any changes, not improving  Further disposition pending results of labs. Discussed med's effects and SE's.   Over 20 minutes of exam, counseling, chart review, and critical decision making was performed.   Future Appointments  Date Time Provider [place]  08/27/2020 11:00 AM Liane Comber, NP [facility] None    ------------------------------------------------------------------------------------------------------------------   HPI Pulse 79   Temp 97.9 F (36.6 C)   Wt 196 lb (88.9 kg)   LMP 05/16/2014   SpO2 99%   BMI 30.24 kg/m   55 y.o.female presents for evaluation of left sided neck pain x 2 months. 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   December she started noting headache and lateral/posterior neck pain, aching, ~8/10, She wondered if she slept wrong, may wake her up, intermittent without pattern. Denies midline pain, all through shoulder/lateral neck. Denies radiating pain. Denies numbness, tingling, weakness of upper extremities.   She reports pain episodes will begin suddenly, will take tylenol and this seems to relieve pretty consistently. Will typically resolve after a few hours. May  have night or day.   Has tried ice packs without benefit. Has done chiropractic in the past but not recently, wonders if this might help.   Xray in 04/2016 showed Degenerative disc disease is identified at C5-6 and C6-7. Narrowing of the right C5-6 and C6-7 neuro foramina. There is mild narrowing of left C5-6 neuro foramina   Past Medical History:  Diagnosis Date  . Adrenal adenoma   . Allergy   . Anemia   . Diabetes mellitus without complication ([place])   . Endometriosis of rectovaginal septum   . GERD (gastroesophageal reflux disease)   . Headache    MIGRAINES  . Hyperaldosteronism ([place])    Possibly  . Hyperlipidemia   . Hypertension   . Hypokalemia   . Ovarian cyst      Allergies  Allergen Reactions  . Ace Inhibitors Cough  . Hctz [Hydrochlorothiazide] Cough    Current Outpatient Medications on File Prior to Visit  Medication Sig  . aspirin 81 MG EC tablet Take 81 mg by mouth every morning.  . Blood Glucose Monitoring Suppl (ONE TOUCH ULTRA MINI) w/Device KIT See admin instructions. for testing  . Cyanocobalamin (B-12 PO) Take 1 tablet by mouth daily. Reported on 12/14/2015  . insulin detemir (LEVEMIR) 100 UNIT/ML FlexPen INJECT 14-20 UNITS DEPENDING ON MORNING SUGAR ONCE DAILY SUBCUTANEOUSLY.  . Insulin Pen Needle (NOVOFINE PEN NEEDLE) 32G X 6 MM MISC 1 Units by Does not apply route daily.  . Iron TABS Take 1 tablet by mouth daily. Reported on 09/08/2015  . Magnesium 250 MG TABS Take by mouth.  . metFORMIN (GLUCOPHAGE-XR) 500 MG 24 hr tablet TAKE  1 TABLET BY MOUTH TWICE A DAY WITH MEAL AS TOLERATED, FOR DIABETES  . ONE TOUCH ULTRA TEST test strip 1 each by Other route 2 (two) times daily. for testing  . spironolactone (ALDACTONE) 50 MG tablet Take 100 mg in am and 50 mg in pm  . Vitamin D, Ergocalciferol, (DRISDOL) 1.25 MG (50000 UNIT) CAPS capsule TAKE 1 CAPSULE BY MOUTH EVERY 7 DAYS  . omeprazole (PRILOSEC) 40 MG capsule Take 1 capsule (40 mg total) by mouth daily.  (Patient not taking: Reported on 07/26/2020)  . rosuvastatin (CRESTOR) 5 MG tablet TAKE 1 TABLET BY MOUTH EVERYDAY AT BEDTIME (Patient not taking: Reported on 07/26/2020)   No current facility-administered medications on file prior to visit.    ROS: all negative except above.   Physical Exam:  Pulse 79   Temp 97.9 F (36.6 C)   Wt 196 lb (88.9 kg)   LMP 05/16/2014   SpO2 99%   BMI 30.24 kg/m   General Appearance: Well nourished, in no apparent distress. Eyes: PERRLA, EOMs, conjunctiva no swelling or erythema Sinuses: No Frontal/maxillary tenderness ENT/Mouth: Ext aud canals clear, TMs without erythema, bulging. No erythema, swelling, or exudate on post pharynx.  Tonsils not swollen or erythematous. Hearing normal.  Neck: Supple, thyroid normal.  Respiratory: Respiratory effort normal, BS equal bilaterally without rales, rhonchi, wheezing or stridor.  Cardio: RRR with no MRGs. Brisk peripheral pulses without edema.  Abdomen: Soft, + BS.  Non tender, no guarding, rebound, hernias, masses. Lymphatics: Non tender without lymphadenopathy.  Musculoskeletal: Full intact cervical ROM, does have mild spinous tenderness aroudn C7, T1; she has trap tenderness, left sternocleidomastoic tenderness, 5/5 strength bil upper extremities, normal gait.  Skin: Warm, dry without rashes, lesions, ecchymosis.  Neuro: Cranial nerves intact. Normal muscle tone, no cerebellar symptoms. Sensation intact.  Psych: Awake and oriented X 3, normal affect, Insight and Judgment appropriate.     Izora Ribas, NP 4:59 PM [facility]

## 2020-07-26 ENCOUNTER — Encounter: Payer: Self-pay | Admitting: Adult Health

## 2020-07-26 ENCOUNTER — Ambulatory Visit (INDEPENDENT_AMBULATORY_CARE_PROVIDER_SITE_OTHER): Payer: No Typology Code available for payment source | Admitting: Adult Health

## 2020-07-26 ENCOUNTER — Ambulatory Visit
Admission: RE | Admit: 2020-07-26 | Discharge: 2020-07-26 | Disposition: A | Discharge status: Home / Self Care | Payer: 59 | Source: Ambulatory Visit | Attending: Adult Health | Admitting: Adult Health

## 2020-07-26 ENCOUNTER — Other Ambulatory Visit: Payer: Self-pay

## 2020-07-26 VITALS — HR 79 | Temp 97.9°F | Wt 196.0 lb

## 2020-07-26 DIAGNOSIS — M542 Cervicalgia: Secondary | ICD-10-CM

## 2020-07-26 DIAGNOSIS — G44219 Episodic tension-type headache, not intractable: Secondary | ICD-10-CM

## 2020-07-26 DIAGNOSIS — M47812 Spondylosis without myelopathy or radiculopathy, cervical region: Secondary | ICD-10-CM | POA: Diagnosis not present

## 2020-07-26 DIAGNOSIS — M4319 Spondylolisthesis, multiple sites in spine: Secondary | ICD-10-CM | POA: Diagnosis not present

## 2020-07-26 NOTE — Patient Instructions (Signed)
Tension Headache, Adult A tension headache is a feeling of pain, pressure, or aching over the front and sides of the head. The pain can be dull, or it can feel tight. There are two types of tension headache:  Episodic tension headache. This is when the headaches happen fewer than 15 days a month.  Chronic tension headache. This is when the headaches happen more than 15 days a month during a 3-month period. A tension headache can last from 30 minutes to several days. It is the most common kind of headache. Tension headaches are not normally associated with nausea or vomiting, and they do not get worse with physical activity. What are the causes? The exact cause of this condition is not known. Tension headaches are often triggered by stress, anxiety, or depression. Other triggers may include:  Alcohol.  Too much caffeine or caffeine withdrawal.  Respiratory infections, such as colds, flu, or sinus infections.  Dental problems or teeth clenching.  Fatigue.  Holding your head and neck in the same position for a long period of time, such as while using a computer.  Smoking.  Arthritis of the neck. What are the signs or symptoms? Symptoms of this condition include:  A feeling of pressure or tightness around the head.  Dull, aching head pain.  Pain over the front and sides of the head.  Tenderness in the muscles of the head, neck, and shoulders. How is this diagnosed? This condition may be diagnosed based on your symptoms, your medical history, and a physical exam. If your symptoms are severe or unusual, you may have imaging tests, such as a CT scan or an MRI of your head. Your vision may also be checked. How is this treated? This condition may be treated with lifestyle changes and with medicines that help relieve symptoms. Follow these instructions at home: Managing pain  Take over-the-counter and prescription medicines only as told by your health care provider.  When you have  a headache, lie down in a dark, quiet room.  If directed, put ice on your head and neck. To do this: ? Put ice in a plastic bag. ? Place a towel between your skin and the bag. ? Leave the ice on for 20 minutes, 2-3 times a day. ? Remove the ice if your skin turns bright red. This is very important. If you cannot feel pain, heat, or cold, you have a greater risk of damage to the area.  If directed, apply heat to the back of your neck as often as told by your health care provider. Use the heat source that your health care provider recommends, such as a moist heat pack or a heating pad. ? Place a towel between your skin and the heat source. ? Leave the heat on for 20-30 minutes. ? Remove the heat if your skin turns bright red. This is especially important if you are unable to feel pain, heat, or cold. You have a greater risk of getting burned. Eating and drinking  Eat meals on a regular schedule.  If you drink alcohol: ? Limit how much you have to:  0-1 drink a day for women who are not pregnant.  0-2 drinks a day for men. ? Know how much alcohol is in your drink. In the [place] one drink equals one 12 oz bottle of beer (355 mL), one 5 oz glass of wine (148 mL), or one 1 oz glass of hard liquor (44 mL).  Drink enough fluid to keep your urine   pale yellow.  Decrease your caffeine intake, or stop using caffeine. Lifestyle  Get 7-9 hours of sleep each night, or get the amount of sleep recommended by your health care provider.  At bedtime, remove computers, phones, and tablets from your room.  Find ways to manage your stress. This may include: ? Exercise. ? Deep breathing exercises. ? Yoga. ? Listening to music. ? Positive mental imagery.  Try to sit up straight and avoid tensing your muscles.  Do not use any products that contain nicotine or tobacco. These include cigarettes, chewing tobacco, and vaping devices, such as e-cigarettes. If you need help quitting, ask your health care  provider. General instructions  Avoid any headache triggers. Keep a journal to help find out what may trigger your headaches. For example, write down: ? What you eat and drink. ? How much sleep you get. ? Any change to your diet or medicines.  Keep all follow-up visits. This is important.   Contact a health care provider if:  Your headache does not get better.  Your headache comes back.  You are sensitive to sounds, light, or smells because of a headache.  You have nausea or you vomit.  Your stomach hurts. Get help right away if:  You suddenly develop a severe headache, along with any of the following: ? A stiff neck. ? Nausea and vomiting. ? Confusion. ? Weakness in one part or one side of your body. ? Double vision or loss of vision. ? Shortness of breath. ? Rash. ? Unusual sleepiness. ? Fever or chills. ? Trouble speaking. ? Pain in your eye or ear. ? Trouble walking or balancing. ? Feeling faint or passing out. Summary  A tension headache is a feeling of pain, pressure, or aching over the front and sides of the head.  A tension headache can last from 30 minutes to several days. It is the most common kind of headache.  This condition may be diagnosed based on your symptoms, your medical history, and a physical exam.  This condition may be treated with lifestyle changes and with medicines that help relieve symptoms. This information is not intended to replace advice given to you by your health care provider. Make sure you discuss any questions you have with your health care provider. Document Revised: 02/19/2020 Document Reviewed: 02/19/2020 Elsevier Patient Education  2021 [employer]  

## 2020-07-27 ENCOUNTER — Other Ambulatory Visit: Payer: Self-pay | Admitting: Adult Health

## 2020-07-27 DIAGNOSIS — M62838 Other muscle spasm: Secondary | ICD-10-CM

## 2020-07-27 DIAGNOSIS — G44219 Episodic tension-type headache, not intractable: Secondary | ICD-10-CM

## 2020-07-28 ENCOUNTER — Other Ambulatory Visit: Payer: Self-pay | Admitting: Adult Health

## 2020-07-28 DIAGNOSIS — E269 Hyperaldosteronism, unspecified: Secondary | ICD-10-CM

## 2020-07-28 DIAGNOSIS — I10 Essential (primary) hypertension: Secondary | ICD-10-CM

## 2020-07-30 DIAGNOSIS — R0902 Hypoxemia: Secondary | ICD-10-CM | POA: Diagnosis not present

## 2020-07-30 DIAGNOSIS — R000 Tachycardia, unspecified: Secondary | ICD-10-CM | POA: Diagnosis not present

## 2020-07-30 DIAGNOSIS — I10 Essential (primary) hypertension: Secondary | ICD-10-CM | POA: Diagnosis not present

## 2020-08-05 ENCOUNTER — Other Ambulatory Visit: Payer: Self-pay | Admitting: Adult Health

## 2020-08-05 MED ORDER — AMLODIPINE BESYLATE 2.5 MG PO TABS: 2.5000 mg | ORAL_TABLET | Freq: Every day | ORAL | 1 refills | Status: DC

## 2020-08-06 ENCOUNTER — Encounter: Payer: No Typology Code available for payment source | Admitting: Adult Health

## 2020-08-09 ENCOUNTER — Other Ambulatory Visit: Payer: Self-pay | Admitting: Adult Health

## 2020-08-09 DIAGNOSIS — G459 Transient cerebral ischemic attack, unspecified: Secondary | ICD-10-CM

## 2020-08-09 DIAGNOSIS — R519 Headache, unspecified: Secondary | ICD-10-CM

## 2020-08-10 DIAGNOSIS — M799 Soft tissue disorder, unspecified: Secondary | ICD-10-CM | POA: Diagnosis not present

## 2020-08-10 DIAGNOSIS — R293 Abnormal posture: Secondary | ICD-10-CM | POA: Diagnosis not present

## 2020-08-10 DIAGNOSIS — R519 Headache, unspecified: Secondary | ICD-10-CM | POA: Diagnosis not present

## 2020-08-10 DIAGNOSIS — M25511 Pain in right shoulder: Secondary | ICD-10-CM | POA: Diagnosis not present

## 2020-08-10 DIAGNOSIS — M25512 Pain in left shoulder: Secondary | ICD-10-CM | POA: Diagnosis not present

## 2020-08-10 DIAGNOSIS — M2569 Stiffness of other specified joint, not elsewhere classified: Secondary | ICD-10-CM | POA: Diagnosis not present

## 2020-08-10 DIAGNOSIS — M6281 Muscle weakness (generalized): Secondary | ICD-10-CM | POA: Diagnosis not present

## 2020-08-10 DIAGNOSIS — M542 Cervicalgia: Secondary | ICD-10-CM | POA: Diagnosis not present

## 2020-08-13 ENCOUNTER — Other Ambulatory Visit: Payer: Self-pay | Admitting: Adult Health Nurse Practitioner

## 2020-08-13 DIAGNOSIS — R9082 White matter disease, unspecified: Secondary | ICD-10-CM | POA: Diagnosis not present

## 2020-08-13 DIAGNOSIS — R0902 Hypoxemia: Secondary | ICD-10-CM | POA: Diagnosis not present

## 2020-08-13 DIAGNOSIS — E785 Hyperlipidemia, unspecified: Secondary | ICD-10-CM

## 2020-08-13 DIAGNOSIS — R519 Headache, unspecified: Secondary | ICD-10-CM | POA: Diagnosis not present

## 2020-08-13 DIAGNOSIS — I7300 Raynaud's syndrome without gangrene: Secondary | ICD-10-CM | POA: Diagnosis not present

## 2020-08-13 DIAGNOSIS — R002 Palpitations: Secondary | ICD-10-CM | POA: Diagnosis not present

## 2020-08-13 DIAGNOSIS — E039 Hypothyroidism, unspecified: Secondary | ICD-10-CM | POA: Diagnosis not present

## 2020-08-13 DIAGNOSIS — R531 Weakness: Secondary | ICD-10-CM | POA: Diagnosis not present

## 2020-08-13 DIAGNOSIS — E1169 Type 2 diabetes mellitus with other specified complication: Secondary | ICD-10-CM

## 2020-08-13 DIAGNOSIS — I10 Essential (primary) hypertension: Secondary | ICD-10-CM | POA: Diagnosis not present

## 2020-08-26 ENCOUNTER — Encounter: Payer: Self-pay | Admitting: Adult Health Nurse Practitioner

## 2020-08-26 ENCOUNTER — Other Ambulatory Visit: Payer: Self-pay

## 2020-08-26 ENCOUNTER — Ambulatory Visit (INDEPENDENT_AMBULATORY_CARE_PROVIDER_SITE_OTHER): Payer: No Typology Code available for payment source | Admitting: Adult Health Nurse Practitioner

## 2020-08-26 VITALS — BP 126/80 | HR 67 | Temp 97.5°F | Wt 190.0 lb

## 2020-08-26 DIAGNOSIS — N804 Endometriosis of rectovaginal septum, unspecified involvement of vagina: Secondary | ICD-10-CM

## 2020-08-26 DIAGNOSIS — E785 Hyperlipidemia, unspecified: Secondary | ICD-10-CM

## 2020-08-26 DIAGNOSIS — E1169 Type 2 diabetes mellitus with other specified complication: Secondary | ICD-10-CM

## 2020-08-26 DIAGNOSIS — E559 Vitamin D deficiency, unspecified: Secondary | ICD-10-CM

## 2020-08-26 DIAGNOSIS — G459 Transient cerebral ischemic attack, unspecified: Secondary | ICD-10-CM

## 2020-08-26 DIAGNOSIS — T7840XD Allergy, unspecified, subsequent encounter: Secondary | ICD-10-CM

## 2020-08-26 DIAGNOSIS — Z1389 Encounter for screening for other disorder: Secondary | ICD-10-CM

## 2020-08-26 DIAGNOSIS — Z0000 Encounter for general adult medical examination without abnormal findings: Secondary | ICD-10-CM

## 2020-08-26 DIAGNOSIS — Z794 Long term (current) use of insulin: Secondary | ICD-10-CM

## 2020-08-26 DIAGNOSIS — Z1321 Encounter for screening for nutritional disorder: Secondary | ICD-10-CM

## 2020-08-26 DIAGNOSIS — I10 Essential (primary) hypertension: Secondary | ICD-10-CM | POA: Diagnosis not present

## 2020-08-26 DIAGNOSIS — Z0001 Encounter for general adult medical examination with abnormal findings: Secondary | ICD-10-CM

## 2020-08-26 DIAGNOSIS — Z136 Encounter for screening for cardiovascular disorders: Secondary | ICD-10-CM | POA: Diagnosis not present

## 2020-08-26 DIAGNOSIS — E119 Type 2 diabetes mellitus without complications: Secondary | ICD-10-CM

## 2020-08-26 DIAGNOSIS — E269 Hyperaldosteronism, unspecified: Secondary | ICD-10-CM

## 2020-08-26 DIAGNOSIS — Z1329 Encounter for screening for other suspected endocrine disorder: Secondary | ICD-10-CM

## 2020-08-26 DIAGNOSIS — D649 Anemia, unspecified: Secondary | ICD-10-CM

## 2020-08-26 DIAGNOSIS — I7300 Raynaud's syndrome without gangrene: Secondary | ICD-10-CM

## 2020-08-26 DIAGNOSIS — E876 Hypokalemia: Secondary | ICD-10-CM

## 2020-08-26 MED ORDER — SPIRONOLACTONE 50 MG PO TABS
ORAL_TABLET | ORAL | 3 refills | Status: DC
Start: 2020-08-26 — End: 2022-05-08

## 2020-08-26 NOTE — Progress Notes (Signed)
COMPLETE PHYSICAL  Assessment and Plan:  Encounter for general adult medical examination with abnormal findings [name]  Essential hypertension Continue current medications: previously prescribed amlodipine 2.53m did not start, Monitor blood pressure at home; call if consistently over 130/80 Continue DASH diet.   Reminder to go to the ER if any CP, SOB, nausea, dizziness, severe HA, changes vision/speech, left arm numbness and tingling and jaw pain.   Transient cerebral ischemia, unspecified type Control blood pressure, cholesterol, glucose, increase exercise.   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[place] -     BASIC METABOLIC PANEL WITH GFR  Hyperlipidemia, unspecified hyperlipidemia type Prescribed rosuvastatin 568m not taking this, discussed at length with patient Discussed dietary and exercise modifications Low fat diet -     Lipid panel - goal is less than 70  Hypokalemia -     BASIC METABOLIC PANEL WITH GFR -     Magnesium  Anemia, unspecified type - monitor, continue iron supp with Vitamin C and increase green leafy veggies  Allergic state, subsequent encounter Taking OTC  Endometriosis of rectovaginal septum History of, asymptomatic today  Vitamin D deficiency Continue supplementation to maintain goal of 70-100 Taking Vitamin D 50,000 weekly IU daily -Vitamin D  Type 2 diabetes mellitus with hyperlipidemia (HCC) Taking Metformin 50035mone tablet BID Discussed general issues about diabetes pathophysiology and management.,  Suggested low cholesterol diet.,  Encouraged aerobic exercise.,  Discussed foot care.,  Reminded to get yearly retinal exam.  -     Hemoglobin A1c  Diabetes mellitus type 2, insulin dependent (HCC) -     Hemoglobin A1c     Further disposition pending results if labs check today. Discussed med's effects and SE's.   Over 30 minutes of face to face interview, exam, counseling, chart review, and critical decision making was performed.    Discussed med's  effects and SE's. Screening labs and tests as requested with regular follow-up as recommended. ______________________________________________________________  HPI [age]o. female  presents for a complete physical.  Had one visit for palpitations to the ED [facility] on 08/13/20.  There were no acute findings at that time.  Reports since covid vaccination in Jan she has had elevated b/p intermittently, palpitations, sensory changes bilateral lower extremities and night sweats.  Reports the night sweats have since resolved.  She also has been having mirgraine headaches.  She was taking tyleenol 1000m30mich did dull the pain.  She is going to PT for headaches, has had one session and has improved and plans to continue this.   She also has follow up with Dr WillJannifer Franklin3/30 this month.  History of TIA.  Discussed importance of controlling glucose, cholesterol and blood pressure as this increases risks of another event.  She reports she has been checking her blood pressure at home.  Her highest 143/93 and lowest is 117/80. She was prescribed amlodipine but did not start taking this.  Reports she has been exercising regularly and has been feeling better overall.   She was previously having night sweats but now has resolved.  Her blood pressure has been controlled at home, today their BP is BP: 126/80 She does workout, she has been walking. She denies chest pain, shortness of breath, dizziness.   BMI is Body mass index is 29.32 kg/m., she is working on diet and exercise. Wt Readings from Last 3 Encounters:  08/26/20 190 lb (86.2 kg)  07/26/20 196 lb (88.9 kg)  05/14/20 192 lb 9.6 oz (87.4 kg)   She is not on cholesterol medication  and denies myalgias. Her cholesterol is not at goal, LDL of 70. The cholesterol last visit was:   Lab Results  Component Value Date   CHOL 219 (H) 08/26/2020   HDL 65 08/26/2020   LDLCALC 137 (H) 08/26/2020   TRIG 80 08/26/2020   CHOLHDL 3.4 08/26/2020   She has been  working on diet and exercise for diabetes   has not checked sugars due to taking care of her mother before her death Levemir 16units 11am-1pm.  548m BID. Lowest fasting 114 highest 150 In the evening prior to be 140's-160's  Meter: Dexcom she is on bASA she is not on ACE/ARB Denies foot ulcerations, nausea, paresthesia of the feet and visual disturbances.   Last A1C in the office was:  Lab Results  Component Value Date   HGBA1C 8.2 (H) 08/26/2020  Has possible history of aldosteronism, follows with Dr. GDarnell Levelfor this and DM, on spirolactone 560mBID and tolerates well.  Lab Results  Component Value Date   GFRAA 107 08/26/2020   Patient is on Vitamin D supplement, 1000 daily. .    Current Medications:  Current Outpatient Medications on File Prior to Visit  Medication Sig Dispense Refill  . amLODipine (NORVASC) 2.5 MG tablet Take 1 tablet (2.5 mg total) by mouth daily. 90 tablet 1  . aspirin 81 MG EC tablet Take 81 mg by mouth every morning.  0  . BD PEN NEEDLE MICRO U/F 32G X 6 MM MISC USE AS DIRECTED 100 each 2  . Blood Glucose Monitoring Suppl (ONE TOUCH ULTRA MINI) w/Device KIT See admin instructions. for testing  0  . Cyanocobalamin (B-12 PO) Take 1 tablet by mouth daily. Reported on 12/14/2015    . insulin detemir (LEVEMIR) 100 UNIT/ML FlexPen INJECT 14-20 UNITS DEPENDING ON MORNING SUGAR ONCE DAILY SUBCUTANEOUSLY. 15 mL 11  . Iron TABS Take 1 tablet by mouth daily. Reported on 09/08/2015    . Magnesium 250 MG TABS Take by mouth.    . metFORMIN (GLUCOPHAGE-XR) 500 MG 24 hr tablet TAKE 1 TABLET BY MOUTH TWICE A DAY WITH MEAL AS TOLERATED, FOR DIABETES 180 tablet PRN  . omeprazole (PRILOSEC) 40 MG capsule Take 1 capsule (40 mg total) by mouth daily. 90 capsule 3  . ONE TOUCH ULTRA TEST test strip 1 each by Other route 2 (two) times daily. for testing 100 each 0  . Vitamin D, Ergocalciferol, (DRISDOL) 1.25 MG (50000 UNIT) CAPS capsule TAKE 1 CAPSULE BY MOUTH EVERY 7 DAYS 12 capsule 7    No current facility-administered medications on file prior to visit.   Health Maintenance:   Immunization History  Administered Date(s) Administered  . Moderna Sars-Covid-2 Vaccination 06/11/2020, 07/09/2020  . Td 06/06/2007   Tetanus: 2009 Pneumovax: N/A Flu vaccine: Declines Zostavax: N/A  Pap: 02/2017 neg HPV no new partners negative, next 2023 MGM: 02/2020 DEXA: N/A  Colonoscopy:  09/2019, adenomatous Q3years Dr ArHavery MorosGD: N/A  Ct HEAD 2017 MRI BRAIN 2016    Medical History:  Past Medical History:  Diagnosis Date  . Adrenal adenoma   . Allergy   . Anemia   . Diabetes mellitus without complication ([place]  . Endometriosis of rectovaginal septum   . GERD (gastroesophageal reflux disease)   . Headache    MIGRAINES  . Hyperaldosteronism ([place]   Possibly  . Hyperlipidemia   . Hypertension   . Hypokalemia   . Ovarian cyst    Allergies Allergies  Allergen Reactions  . Ace Inhibitors Cough  .  Hctz [Hydrochlorothiazide] Cough    SURGICAL HISTORY She  has a past surgical history that includes Cesarean section; Cardiac catheterization; Tubal ligation; and Robotic assisted total hysterectomy with bilateral salpingo oophorectomy (Bilateral, 06/16/2014). FAMILY HISTORY Her family history includes Colon cancer (age of onset: 38) in her father; Diabetes in her father and mother; Hepatitis C in her father; Lupus in her maternal aunt and paternal aunt; Pancreatic cancer in her father; Scleroderma in her maternal grandmother; Stroke in her brother, maternal grandfather, and mother. SOCIAL HISTORY She  reports that she has never smoked. She has never used smokeless tobacco. She reports current alcohol use. She reports that she does not use drugs.  Review of Systems  Constitutional: Positive for malaise/fatigue. Negative for chills, diaphoresis, fever and weight loss.  HENT: Negative for congestion, ear discharge, ear pain, hearing loss, nosebleeds, sinus pain, sore  throat and tinnitus.   Eyes: Negative for blurred vision, double vision, photophobia, pain, discharge and redness.  Respiratory: Negative for cough, hemoptysis, sputum production, shortness of breath, wheezing and stridor.   Cardiovascular: Negative for chest pain, palpitations, orthopnea, claudication, leg swelling and PND.  Gastrointestinal: Negative for abdominal pain, blood in stool, constipation, diarrhea, heartburn, melena, nausea and vomiting.  Genitourinary: Negative for dysuria, flank pain, frequency, hematuria and urgency.  Musculoskeletal: Negative for back pain, falls, joint pain, myalgias and neck pain.  Skin: Negative for itching and rash.  Neurological: Positive for sensory change. Negative for dizziness, tingling, tremors, speech change, focal weakness, seizures, loss of consciousness, weakness and headaches.       Bilateral feet, intermittent  Endo/Heme/Allergies: Negative for environmental allergies and polydipsia. Does not bruise/bleed easily.  Psychiatric/Behavioral: Negative for depression, hallucinations, memory loss, substance abuse and suicidal ideas. The patient is not nervous/anxious and does not have insomnia.       Physical Exam: Estimated body mass index is 29.32 kg/m as calculated from the following:   Height as of 09/22/19: 5' 7.5" (1.715 m).   Weight as of this encounter: 190 lb (86.2 kg). BP 126/80   Pulse 67   Temp (!) 97.5 F (36.4 C)   Wt 190 lb (86.2 kg)   LMP 05/16/2014   SpO2 95%   BMI 29.32 kg/m  General Appearance: Well nourished, in no apparent distress. Eyes: PERRLA, EOMs, conjunctiva no swelling or erythema, normal fundi and vessels. Sinuses: No Frontal/maxillary tenderness ENT/Mouth: Ext aud canals clear, normal light reflex with TMs without erythema, bulging.  Good dentition. No erythema, swelling, or exudate on post pharynx. Tonsils not swollen or erythematous. Hearing normal.  Neck: Supple, thyroid normal. No bruits Respiratory:  Respiratory effort normal, BS equal bilaterally without rales, rhonchi, wheezing or stridor. Cardio: RRR without murmurs, rubs or gallops. Brisk peripheral pulses without edema.  Chest: symmetric, with normal excursions and percussion. Breasts: defer Abdomen: Soft, +BS. Non tender, no guarding, rebound, hernias, masses, or organomegaly. .  Lymphatics: Non tender without lymphadenopathy.  Genitourinary: defer Musculoskeletal: Full ROM all peripheral extremities,5/5 strength, and normal gait. Skin: Warm, dry without rashes, lesions, ecchymosis.  Neuro: Cranial nerves intact, reflexes equal bilaterally. Normal muscle tone, no cerebellar symptoms. Sensation intact.  Psych: Awake and oriented X 3, normal affect, Insight and Judgment appropriate.    EKG: NSR, no ST changes   Bayard Males, DNP [facility] 08/26/2020  4:06 PM

## 2020-08-27 ENCOUNTER — Other Ambulatory Visit: Payer: Self-pay | Admitting: Adult Health Nurse Practitioner

## 2020-08-27 ENCOUNTER — Encounter: Payer: No Typology Code available for payment source | Admitting: Adult Health

## 2020-08-27 DIAGNOSIS — Z794 Long term (current) use of insulin: Secondary | ICD-10-CM

## 2020-08-27 DIAGNOSIS — E119 Type 2 diabetes mellitus without complications: Secondary | ICD-10-CM

## 2020-08-28 LAB — COMPLETE METABOLIC PANEL WITH GFR
AG Ratio: 1.7 (calc) (ref 1.0–2.5)
ALT: 11 U/L (ref 6–29)
AST: 13 U/L (ref 10–35)
Albumin: 4.7 g/dL (ref 3.6–5.1)
Alkaline phosphatase (APISO): 67 U/L (ref 37–153)
BUN: 13 mg/dL (ref 7–25)
CO2: 27 mmol/L (ref 20–32)
Calcium: 9.8 mg/dL (ref 8.6–10.4)
Chloride: 102 mmol/L (ref 98–110)
Creat: 0.73 mg/dL (ref 0.50–1.05)
GFR, Est African American: 107 mL/min/{1.73_m2} (ref >=60)
GFR, Est Non African American: 93 mL/min/{1.73_m2} (ref >=60)
Globulin: 2.7 g/dL (calc) (ref 1.9–3.7)
Glucose, Bld: 98 mg/dL (ref 65–99)
Potassium: 4.6 mmol/L (ref 3.5–5.3)
Sodium: 139 mmol/L (ref 135–146)
Total Bilirubin: 0.6 mg/dL (ref 0.2–1.2)
Total Protein: 7.4 g/dL (ref 6.1–8.1)

## 2020-08-28 LAB — CBC WITH DIFFERENTIAL/PLATELET
Absolute Monocytes: 442 cells/uL (ref 200–950)
Basophils Absolute: 40 cells/uL (ref 0–200)
Basophils Relative: 0.5 %
Eosinophils Absolute: 32 cells/uL (ref 15–500)
Eosinophils Relative: 0.4 %
HCT: 42.3 % (ref 35.0–45.0)
Hemoglobin: 13.2 g/dL (ref 11.7–15.5)
Lymphs Abs: 2647 cells/uL (ref 850–3900)
MCH: 26.3 pg — ABNORMAL LOW (ref 27.0–33.0)
MCHC: 31.2 g/dL — ABNORMAL LOW (ref 32.0–36.0)
MCV: 84.4 fL (ref 80.0–100.0)
MPV: 11.2 fL (ref 7.5–12.5)
Monocytes Relative: 5.6 %
Neutro Abs: 4740 cells/uL (ref 1500–7800)
Neutrophils Relative %: 60 %
Platelets: 210 10*3/uL (ref 140–400)
RBC: 5.01 10*6/uL (ref 3.80–5.10)
RDW: 12.7 % (ref 11.0–15.0)
Total Lymphocyte: 33.5 %
WBC: 7.9 10*3/uL (ref 3.8–10.8)

## 2020-08-28 LAB — URINE CULTURE
MICRO NUMBER:: 11691273
SPECIMEN QUALITY:: ADEQUATE

## 2020-08-28 LAB — VITAMIN D 25 HYDROXY (VIT D DEFICIENCY, FRACTURES): Vit D, 25-Hydroxy: 94 ng/mL (ref 30–100)

## 2020-08-28 LAB — URINALYSIS W MICROSCOPIC + REFLEX CULTURE
Bacteria, UA: NONE SEEN /HPF
Bilirubin Urine: NEGATIVE
Glucose, UA: NEGATIVE
Hgb urine dipstick: NEGATIVE
Hyaline Cast: NONE SEEN /LPF
Nitrites, Initial: NEGATIVE
Protein, ur: NEGATIVE
RBC / HPF: NONE SEEN /HPF (ref 0–2)
Specific Gravity, Urine: 1.016 (ref 1.001–1.03)
Squamous Epithelial / HPF: NONE SEEN /HPF (ref <=5)
pH: 6 (ref 5.0–8.0)

## 2020-08-28 LAB — TSH: TSH: 1.68 mIU/L

## 2020-08-28 LAB — LIPID PANEL
Cholesterol: 219 mg/dL — ABNORMAL HIGH (ref <200)
HDL: 65 mg/dL (ref >=50)
LDL Cholesterol (Calc): 137 mg/dL (calc) — ABNORMAL HIGH
Non-HDL Cholesterol (Calc): 154 mg/dL (calc) — ABNORMAL HIGH (ref <130)
Total CHOL/HDL Ratio: 3.4 (calc) (ref <5.0)
Triglycerides: 80 mg/dL (ref <150)

## 2020-08-28 LAB — HEMOGLOBIN A1C
Hgb A1c MFr Bld: 8.2 % of total Hgb — ABNORMAL HIGH (ref <5.7)
Mean Plasma Glucose: 189 mg/dL
eAG (mmol/L): 10.4 mmol/L

## 2020-08-28 LAB — CULTURE INDICATED

## 2020-08-28 LAB — VITAMIN B12: Vitamin B-12: 468 pg/mL (ref 200–1100)

## 2020-08-28 LAB — MAGNESIUM: Magnesium: 1.9 mg/dL (ref 1.5–2.5)

## 2020-09-01 ENCOUNTER — Ambulatory Visit (INDEPENDENT_AMBULATORY_CARE_PROVIDER_SITE_OTHER): Payer: No Typology Code available for payment source | Admitting: Neurology

## 2020-09-01 ENCOUNTER — Encounter: Payer: Self-pay | Admitting: Neurology

## 2020-09-01 VITALS — BP 134/90 | HR 80 | Ht 67.5 in | Wt 190.6 lb

## 2020-09-01 DIAGNOSIS — I679 Cerebrovascular disease, unspecified: Secondary | ICD-10-CM

## 2020-09-01 DIAGNOSIS — G4486 Cervicogenic headache: Secondary | ICD-10-CM

## 2020-09-01 NOTE — Progress Notes (Signed)
  Reason for visit: Headache, neck pain  Referring physician: Dr. Corbett  Kristin Hess is a 55 y.o. female  History of present illness:  Kristin Hess is a 55-year-old right-handed black female with a history of diabetes, hypertension and dyslipidemia.  She does have a history of cerebrovascular disease, she has had a TIA type event in the past.  The patient indicates that she had her second Covid vaccination in January 2022 and subsequently developed some neck discomfort mainly on the right side.  The patient began having some occipital headaches in the back of the head and then around the head associated with some nausea without vomiting.  The patient reported no visual disturbance.  She reported no pain down the arms to the hands.  She did feel somewhat weak in the legs at times, but she did not have any falls.  She does have some numbness in the feet associated with her diabetes, occasionally she may have some vibration sensations that will come and go.  She underwent physical therapy for her neck, she had 1 session of physical therapy with marked improvement in her symptoms.  She is doing some ongoing stretching exercises.  She claims that the headaches and occipital area have essentially gone away.  She may occasionally have a couple headaches a week associated with allergies.  The patient was noted to have a significant swings in blood pressure when she was having the neck discomfort, again this has improved.  She has had a recent CT scan of the brain showing no acute changes.  Her most recent hemoglobin A1c was 8.2.  Past Medical History:  Diagnosis Date  . Adrenal adenoma   . Allergy   . Anemia   . Diabetes mellitus without complication (HCC)   . Endometriosis of rectovaginal septum   . GERD (gastroesophageal reflux disease)   . Headache    MIGRAINES  . Hyperaldosteronism (HCC)    Possibly  . Hyperlipidemia   . Hypertension   . Hypokalemia   . Ovarian cyst     Past  Surgical History:  Procedure Laterality Date  . CARDIAC CATHETERIZATION    . CESAREAN SECTION     X 2  . ROBOTIC ASSISTED TOTAL HYSTERECTOMY WITH BILATERAL SALPINGO OOPHERECTOMY Bilateral 06/16/2014   Procedure: ROBOTIC ASSISTED TOTAL HYSTERECTOMY WITH BILATERAL SALPINGO OOPHORECTOMY, LYSIS OF ADHESIONS;  Surgeon: Kristin Hess;  Location: [facility];  Service: Gynecology;  Laterality: Bilateral;  . TUBAL LIGATION      Family History  Problem Relation Age of Onset  . Stroke Mother   . Diabetes Mother   . Diabetes Father   . Hepatitis C Father   . Colon cancer Father 68  . Pancreatic cancer Father   . Stroke Brother   . Scleroderma Maternal Grandmother   . Stroke Maternal Grandfather   . Lupus Maternal Aunt   . Lupus Paternal Aunt   . Breast cancer Neg Hx   . Esophageal cancer Neg Hx   . Stomach cancer Neg Hx     Social history:  reports that she has never smoked. She has never used smokeless tobacco. She reports current alcohol use. She reports that she does not use drugs.  Medications:  Prior to Admission medications   Medication Sig Start Date End Date Taking? Authorizing Provider  amLODipine (NORVASC) 2.5 MG tablet Take 1 tablet (2.5 mg total) by mouth daily. 08/05/20 08/05/21 Yes Corbett, Ashley, NP  aspirin 81 MG EC tablet Take 81 mg by mouth every   morning. 08/31/15  Yes Provider, Historical, Hess  BD PEN NEEDLE MICRO U/F 32G X 6 MM MISC USE AS DIRECTED 08/13/20  Yes Liane Comber, NP  Blood Glucose Monitoring Suppl (ONE TOUCH ULTRA MINI) w/Device KIT See admin instructions. for testing 09/01/15  Yes Provider, Historical, Hess  Cyanocobalamin (B-12 PO) Take 1 tablet by mouth daily. Reported on 12/14/2015   Yes Provider, Historical, Hess  insulin detemir (LEVEMIR) 100 UNIT/ML FlexPen INJECT 14-20 UNITS DEPENDING ON MORNING SUGAR ONCE DAILY SUBCUTANEOUSLY. 05/14/20  Yes McClanahan, Danton Sewer, NP  Iron TABS Take 1 tablet by mouth daily. Reported on 09/08/2015   Yes Provider, Historical, Hess   Magnesium 250 MG TABS Take by mouth.   Yes Provider, Historical, Hess  metFORMIN (GLUCOPHAGE-XR) 500 MG 24 hr tablet TAKE 1 TABLET BY MOUTH TWICE A DAY WITH MEAL AS TOLERATED, FOR DIABETES 03/24/20  Yes Liane Comber, NP  omeprazole (PRILOSEC) 40 MG capsule Take 1 capsule (40 mg total) by mouth daily. 11/28/16  Yes Armbruster, Carlota Raspberry, Hess  ONE TOUCH ULTRA TEST test strip 1 each by Other route 2 (two) times daily. for testing 11/02/15  Yes Unk Pinto, Hess  spironolactone (ALDACTONE) 50 MG tablet Take 41m in am and 537min pm 08/26/20  Yes McClanahan, Kyra, NP  Vitamin D, Ergocalciferol, (DRISDOL) 1.25 MG (50000 UNIT) CAPS capsule TAKE 1 CAPSULE BY MOUTH EVERY 7 DAYS 11/07/19  Yes CoVladimir CroftsPA-C      Allergies  Allergen Reactions  . Ace Inhibitors Cough  . Hctz [Hydrochlorothiazide] Cough    ROS:  Out of a complete 14 system review of symptoms, the patient complains only of the following symptoms, and all other reviewed systems are negative.  Headache Neck discomfort  Blood pressure 134/90, pulse 80, height 5' 7.5" (1.715 m), weight 190 lb 9.6 oz (86.5 kg), last menstrual period 05/16/2014.  Physical Exam  General: The patient is alert and cooperative at the time of the examination.  Eyes: Pupils are equal, round, and reactive to light. Discs are flat bilaterally.  Neck: The neck is supple, no carotid bruits are noted.  Respiratory: The respiratory examination is clear.  Cardiovascular: The cardiovascular examination reveals a regular rate and rhythm, no obvious murmurs or rubs are noted.  Neuromuscular: Range move the cervical spine is full.  Skin: Extremities are without significant edema.  Neurologic Exam  Mental status: The patient is alert and oriented x 3 at the time of the examination. The patient has apparent normal recent and remote memory, with an apparently normal attention span and concentration ability.  Cranial nerves: Facial symmetry is present.  There is good sensation of the face to pinprick and soft touch bilaterally. The strength of the facial muscles and the muscles to head turning and shoulder shrug are normal bilaterally. Speech is well enunciated, no aphasia or dysarthria is noted. Extraocular movements are full. Visual fields are full. The tongue is midline, and the patient has symmetric elevation of the soft palate. No obvious hearing deficits are noted.  Motor: The motor testing reveals 5 over 5 strength of all 4 extremities. Good symmetric motor tone is noted throughout.  Sensory: Sensory testing is intact to pinprick, soft touch, vibration sensation, and position sense on all 4 extremities. No evidence of extinction is noted.  Coordination: Cerebellar testing reveals good finger-nose-finger and heel-to-shin bilaterally.  Gait and station: Gait is normal. Tandem gait is normal. Romberg is negative. No drift is seen.  Reflexes: Deep tendon reflexes are symmetric and normal bilaterally.  Toes are downgoing bilaterally.   CT head 08/13/20:  Impression  1. No acute intracranial abnormality.  2. Chronically advanced nonspecific white matter disease appears  stable to mildly progressed since 2016.      Assessment/Plan:  1.  Cervicogenic headache  2.  History of cerebrovascular disease  The patient currently is much better with her headache as the neck issues have been treated.  The patient is at or near her baseline currently, we will watch on a conservative basis.  If she does have recurrence of symptoms, she will contact our office.  Jill Alexanders Hess 09/01/2020 3:49 PM  [facility] [address] [place], [place] [address]  Phone [phone] Fax [phone]

## 2020-09-16 LAB — HM DIABETES EYE EXAM

## 2020-09-21 ENCOUNTER — Encounter: Payer: Self-pay | Admitting: Internal Medicine

## 2020-11-09 DIAGNOSIS — E663 Overweight: Secondary | ICD-10-CM | POA: Diagnosis not present

## 2020-11-09 DIAGNOSIS — E785 Hyperlipidemia, unspecified: Secondary | ICD-10-CM | POA: Diagnosis not present

## 2020-11-09 DIAGNOSIS — E119 Type 2 diabetes mellitus without complications: Secondary | ICD-10-CM | POA: Diagnosis not present

## 2020-11-09 DIAGNOSIS — R002 Palpitations: Secondary | ICD-10-CM | POA: Diagnosis not present

## 2021-01-03 ENCOUNTER — Other Ambulatory Visit: Payer: Self-pay

## 2021-01-03 MED ORDER — VITAMIN D (ERGOCALCIFEROL) 1.25 MG (50000 UNIT) PO CAPS: 50000.0000 [IU] | ORAL_CAPSULE | ORAL | 7 refills | Status: DC

## 2021-01-31 ENCOUNTER — Other Ambulatory Visit: Payer: Self-pay | Admitting: Adult Health

## 2021-05-07 DIAGNOSIS — J069 Acute upper respiratory infection, unspecified: Secondary | ICD-10-CM | POA: Diagnosis not present

## 2021-05-10 ENCOUNTER — Encounter: Payer: Self-pay | Admitting: Adult Health

## 2021-05-11 NOTE — Progress Notes (Signed)
OVERDUE FOLLOW UP  Assessment and Plan:   Essential hypertension Continue current medications; restart previously prescribed amlodipine 2.15m  Monitor blood pressure at home; call if consistently over 130/80 Continue DASH diet.   Reminder to go to the ER if any CP, SOB, nausea, dizziness, severe HA, changes vision/speech, left arm numbness and tingling and jaw pain.  Transient cerebral ischemia, unspecified type Control blood pressure, cholesterol, glucose, increase exercise.   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HHastings Dr. GCruzita Ledererfollows -     COMPLETE METABOLIC PANEL WITH GFR  Type 2 diabetes mellitus with hyperlipidemia (HCC) Taking Metformin 5013mthree tabs daily  Off of long acting insulin Hx of ozempc with SE; receptive to trulicity trial - 0.6.54g x 2 samples given and she will follow up with experience prior to full script Discussed general issues about diabetes pathophysiology and management.,  Suggested low cholesterol diet.,  Encouraged aerobic exercise.,  Discussed foot care.,  Reminded to get yearly retinal exam. Encouraged to maintain q3-4 month OVs until improved control  -     Hemoglobin A1c  Hyperlipidemia associated with T2DM (HCDe WittDiscussed LDL goal <70, cardiovascular risks Discussed and patient verbal agreement with plan to restart rosuvastatin 5 mg with slow taper plan; some anxiety about possible SE Discussed dietary and exercise modifications -     Lipid panel  Hypokalemia -     COMPLETE METABOLIC PANEL WITH GFR -     Magnesium  Flu vaccine declined today   Further disposition pending results if labs check today. Discussed med's effects and SE's.   Over 30 minutes of face to face interview, exam, counseling, chart review, and critical decision making was performed.   Future Appointments  Date Time Provider DeCarter3/27/2023  3:00 PM MuMagda BernheimNP [facility] None   ______________________________________________________________  HPI [age].o. AA female   presents for overdue follow up for T2DM, htn, hld, weight.  History of TIA.  Had follow up with Dr. WiJannifer Franklinarlier in 2022.   BMI is Body mass index is 29.32 kg/m., she has been working on diet and exercise but admits "sugar addiction" is an issue. She has access to nutritionist and counseling with free weight program at work, plans to start with this after discussion.  Wt Readings from Last 3 Encounters:  05/12/21 190 lb (86.2 kg)  09/01/20 190 lb 9.6 oz (86.5 kg)  08/26/20 190 lb (86.2 kg)   Her blood pressure has not been controlled at home, today their BP is BP: 132/90 She does workout, she has been walking. She denies chest pain, shortness of breath, dizziness.   She is not on cholesterol medication (has been prescribed rosuvastatin 5 mg in the past and does report tolerated, but stopped, fear of SE) and denies myalgias. Her cholesterol is not at goal, LDL of 70. The cholesterol last visit was:   Lab Results  Component Value Date   CHOL 219 (H) 08/26/2020   HDL 65 08/26/2020   LDLCALC 137 (H) 08/26/2020   TRIG 80 08/26/2020   CHOLHDL 3.4 08/26/2020   She has been working on diet and exercise for diabetes   She has been taking metformin 500 mg 2 tabs with breakfast, 1 tab with dinner; reports some intermittent diarrhea - discussed spreading out Levemir - no longer taking, tapered off earlier in the year Fasting: 200+ in the last 2 months  Meter: Dexcom she is not on bASA - discussed and receptive to restart she is not on ACE/ARB Denies foot ulcerations, nausea,  paresthesia of the feet and visual disturbances.   She has not had diabetes eye but will schedule and requested report be forwarded.  Last A1C in the office was:  Lab Results  Component Value Date   HGBA1C 8.2 (H) 08/26/2020   Has possible history of aldosteronism, follows with Dr. Darnell Level for this and DM, on spirolactone 20m BID and tolerates well.  Lab Results  Component Value Date   GFRAA 107 08/26/2020   Patient  is on Vitamin D supplement, 1000 daily.    Current Medications:  Current Outpatient Medications on File Prior to Visit  Medication Sig Dispense Refill   Blood Glucose Monitoring Suppl (ONE TOUCH ULTRA MINI) w/Device KIT See admin instructions. for testing  0   Cyanocobalamin (B-12 PO) Take 1 tablet by mouth daily. Reported on 12/14/2015     Iron TABS Take 1 tablet by mouth daily. Reported on 09/08/2015     Magnesium 250 MG TABS Take by mouth.     ONE TOUCH ULTRA TEST test strip 1 each by Other route 2 (two) times daily. for testing 100 each 0   spironolactone (ALDACTONE) 50 MG tablet Take 590min am and 5042mn pm 270 tablet 3   Vitamin D, Ergocalciferol, (DRISDOL) 1.25 MG (50000 UNIT) CAPS capsule Take 1 capsule (50,000 Units total) by mouth every 7 (seven) days. 12 capsule 7   amLODipine (NORVASC) 2.5 MG tablet TAKE 1 TABLET BY MOUTH EVERY DAY (Patient not taking: Reported on 05/12/2021) 90 tablet 1   aspirin 81 MG EC tablet Take 81 mg by mouth every morning.  0   BD PEN NEEDLE MICRO U/F 32G X 6 MM MISC USE AS DIRECTED 100 each 2   insulin detemir (LEVEMIR) 100 UNIT/ML FlexPen INJECT 14-20 UNITS DEPENDING ON MORNING SUGAR ONCE DAILY SUBCUTANEOUSLY. 15 mL 11   metFORMIN (GLUCOPHAGE-XR) 500 MG 24 hr tablet TAKE 1 TABLET BY MOUTH TWICE A DAY WITH MEAL AS TOLERATED, FOR DIABETES (Patient taking differently: Takes two tablets in the morning and one tablet at night) 180 tablet PRN   omeprazole (PRILOSEC) 40 MG capsule Take 1 capsule (40 mg total) by mouth daily. (Patient not taking: Reported on 05/12/2021) 90 capsule 3   No current facility-administered medications on file prior to visit.    Medical History:  Past Medical History:  Diagnosis Date   Adrenal adenoma    Allergy    Anemia    Diabetes mellitus without complication (HCC)    Endometriosis of rectovaginal septum    GERD (gastroesophageal reflux disease)    Headache    MIGRAINES   Hyperaldosteronism (HCC)    Possibly    Hyperlipidemia    Hypertension    Hypokalemia    Ovarian cyst    Raynaud disease    hands and feet    Allergies Allergies  Allergen Reactions   Ace Inhibitors Cough   Hctz [Hydrochlorothiazide] Cough    SURGICAL HISTORY She  has a past surgical history that includes Cesarean section; Cardiac catheterization; Tubal ligation; and Robotic assisted total hysterectomy with bilateral salpingo oophorectomy (Bilateral, 06/16/2014). FAMILY HISTORY Her family history includes Colon cancer (age of onset: 68)68n her father; Diabetes in her father and mother; Hepatitis C in her father; Lupus in her maternal aunt and paternal aunt; Pancreatic cancer in her father; Scleroderma in her maternal grandmother; Stroke in her brother, maternal grandfather, and mother. SOCIAL HISTORY She  reports that she has never smoked. She has never used smokeless tobacco. She reports current alcohol  use. She reports that she does not use drugs.  Review of Systems  Constitutional:  Positive for malaise/fatigue. Negative for weight loss.  HENT:  Negative for hearing loss and tinnitus.   Eyes:  Negative for blurred vision and double vision.  Respiratory:  Negative for cough, shortness of breath and wheezing.   Cardiovascular:  Negative for chest pain, palpitations, orthopnea, claudication and leg swelling.  Gastrointestinal:  Positive for diarrhea (intermittent with metformin). Negative for abdominal pain, blood in stool, constipation, heartburn, melena, nausea and vomiting.  Genitourinary: Negative.   Musculoskeletal:  Negative for joint pain and myalgias.  Skin:  Negative for rash.  Neurological:  Negative for dizziness, tingling, sensory change, weakness and headaches.  Endo/Heme/Allergies:  Negative for polydipsia.  Psychiatric/Behavioral: Negative.    All other systems reviewed and are negative.    Physical Exam: Estimated body mass index is 29.32 kg/m as calculated from the following:   Height as of 09/01/20:  5' 7.5" (1.715 m).   Weight as of this encounter: 190 lb (86.2 kg). BP 132/90   Pulse 72   Temp (!) 97.3 F (36.3 C)   Wt 190 lb (86.2 kg)   LMP 05/16/2014   SpO2 99%   BMI 29.32 kg/m  General Appearance: Well nourished, in no apparent distress. Eyes: PERRLA, EOMs, conjunctiva no swelling or erythema Sinuses: No Frontal/maxillary tenderness ENT/Mouth: Ext aud canals clear, normal light reflex with TMs without erythema, bulging.  Good dentition. No erythema, swelling, or exudate on post pharynx. Tonsils not swollen or erythematous. Hearing normal.  Neck: Supple, thyroid normal. No bruits Respiratory: Respiratory effort normal, BS equal bilaterally without rales, rhonchi, wheezing or stridor. Cardio: RRR without murmurs, rubs or gallops. Brisk peripheral pulses without edema.  Abdomen: Soft, +BS. Non tender, no guarding, rebound, hernias, masses, or organomegaly. .  Lymphatics: Non tender without lymphadenopathy.  Musculoskeletal: Full ROM all peripheral extremities,5/5 strength, and normal gait. Skin: Warm, dry without rashes, lesions, ecchymosis.  Neuro: Cranial nerves intact, reflexes equal bilaterally. Normal muscle tone, no cerebellar symptoms.  Psych: Awake and oriented X 3, normal affect, Insight and Judgment appropriate.    Izora Ribas, NP 12:47 PM [facility]

## 2021-05-12 ENCOUNTER — Encounter: Payer: Self-pay | Admitting: Adult Health

## 2021-05-12 ENCOUNTER — Ambulatory Visit (INDEPENDENT_AMBULATORY_CARE_PROVIDER_SITE_OTHER): Payer: 59 | Admitting: Adult Health

## 2021-05-12 ENCOUNTER — Other Ambulatory Visit: Payer: Self-pay

## 2021-05-12 VITALS — BP 132/88 | HR 72 | Temp 97.3°F | Wt 190.0 lb

## 2021-05-12 DIAGNOSIS — E876 Hypokalemia: Secondary | ICD-10-CM

## 2021-05-12 DIAGNOSIS — E269 Hyperaldosteronism, unspecified: Secondary | ICD-10-CM | POA: Diagnosis not present

## 2021-05-12 DIAGNOSIS — E1169 Type 2 diabetes mellitus with other specified complication: Secondary | ICD-10-CM

## 2021-05-12 DIAGNOSIS — I10 Essential (primary) hypertension: Secondary | ICD-10-CM

## 2021-05-12 DIAGNOSIS — E785 Hyperlipidemia, unspecified: Secondary | ICD-10-CM

## 2021-05-12 DIAGNOSIS — Z79899 Other long term (current) drug therapy: Secondary | ICD-10-CM | POA: Diagnosis not present

## 2021-05-12 MED ORDER — TRULICITY 0.75 MG/0.5ML ~~LOC~~ SOAJ: 0.7500 mg | SUBCUTANEOUS | 0 refills | Status: DC

## 2021-05-12 NOTE — Patient Instructions (Addendum)
Goals      Blood Pressure < 130/80     HEMOGLOBIN A1C < 7     Fasting glucose goal <130     LDL CALC < 70       Please restart aspirin  Do insulin as was taking previously until stomach settles  Then stop and try trulicity 3.23 mg injection once weekly into skin of stomach - suggest looking up instruction video for instruction.   If LDL remains above 70 will recommend restarting rosuvastatin 5 mg with slow taper up which you seem to have tolerated well previously.    Dulaglutide Injection What is this medication? DULAGLUTIDE (DOO la GLOO tide) treats type 2 diabetes. It works by increasing insulin levels in your body, which decreases your blood sugar (glucose). It also reduces the amount of sugar released into your blood and slows down your digestion. It can also be used to lower the risk of heart attack and stroke in people with type 2 diabetes. Changes to diet and exercise are often combined with this medication. This medicine may be used for other purposes; ask your health care provider or pharmacist if you have questions. COMMON BRAND NAME(S): Trulicity What should I tell my care team before I take this medication? They need to know if you have any of these conditions: Endocrine tumors (MEN 2) or if someone in your family had these tumors Eye disease, vision problems History of pancreatitis Kidney disease Liver disease Stomach or intestine problems Thyroid cancer or if someone in your family had thyroid cancer An unusual or allergic reaction to dulaglutide, other medications, foods, dyes, or preservatives Pregnant or trying to get pregnant Breast-feeding How should I use this medication? This medication is injected under the skin. You will be taught how to prepare and give it. Take it as directed on the prescription label on the same day of each week. Do NOT prime the pen. Keep taking it unless your care team tells you to stop. If you use this medication with insulin, you  should inject this medication and the insulin separately. Do not mix them together. Do not give the injections right next to each other. Change (rotate) injection sites with each injection. This medication comes with INSTRUCTIONS FOR USE. Ask your pharmacist for directions on how to use this medication. Read the information carefully. Talk to your pharmacist or care team if you have questions. It is important that you put your used needles and syringes in a special sharps container. Do not put them in a trash can. If you do not have a sharps container, call your pharmacist or care team to get one. A special MedGuide will be given to you by the pharmacist with each prescription and refill. Be sure to read this information carefully each time. Talk to your care team about the use of this medication in children. Special care may be needed. Overdosage: If you think you have taken too much of this medicine contact a poison control center or emergency room at once. NOTE: This medicine is only for you. Do not share this medicine with others. What if I miss a dose? If you miss a dose, take it as soon as you can unless it is more than 3 days late. If it is more than 3 days late, skip the missed dose. Take the next dose at the normal time. What may interact with this medication? Other medications for diabetes Many medications may cause changes in blood sugar, these include: Alcohol containing  beverages Antiviral medications for HIV or AIDS Aspirin and aspirin-like medications Certain medications for blood pressure, heart disease, irregular heart beat Chromium Diuretics Female hormones, such as estrogens or progestins, birth control pills Fenofibrate Gemfibrozil Isoniazid Lanreotide Female hormones or anabolic steroids MAOIs like Carbex, Eldepryl, Marplan, Nardil, and Parnate Medications for allergies, asthma, cold, or cough Medications for depression, anxiety, or psychotic disturbances Medications for  weight loss Niacin Nicotine NSAIDs, medications for pain and inflammation, like ibuprofen or naproxen Octreotide Pasireotide Pentamidine Phenytoin Probenecid Quinolone antibiotics such as ciprofloxacin, levofloxacin, ofloxacin Some herbal dietary supplements Steroid medications such as prednisone or cortisone Sulfamethoxazole; trimethoprim Thyroid hormones Some medications can hide the warning symptoms of low blood sugar (hypoglycemia). You may need to monitor your blood sugar more closely if you are taking one of these medications. These include: Beta-blockers, often used for high blood pressure or heart problems (examples include atenolol, metoprolol, propranolol) Clonidine Guanethidine Reserpine This list may not describe all possible interactions. Give your health care provider a list of all the medicines, herbs, non-prescription drugs, or dietary supplements you use. Also tell them if you smoke, drink alcohol, or use illegal drugs. Some items may interact with your medicine. What should I watch for while using this medication? Visit your care team for regular checks on your progress. Check with your care team if you have severe diarrhea, nausea, and vomiting, or if you sweat a lot. The loss of too much body fluid may make it dangerous for you to take this medication. A test called the HbA1C (A1C) will be monitored. This is a simple blood test. It measures your blood sugar control over the last 2 to 3 months. You will receive this test every 3 to 6 months. Learn how to check your blood sugar. Learn the symptoms of low and high blood sugar and how to manage them. Always carry a quick-source of sugar with you in case you have symptoms of low blood sugar. Examples include hard sugar candy or glucose tablets. Make sure others know that you can choke if you eat or drink when you develop serious symptoms of low blood sugar, such as seizures or unconsciousness. Get medical help at once. Tell  your care team if you have high blood sugar. You might need to change the dose of your medication. If you are sick or exercising more than usual, you may need to change the dose of your medication. Do not skip meals. Ask your care team if you should avoid alcohol. Many nonprescription cough and cold products contain sugar or alcohol. These can affect blood sugar. Pens should never be shared. Even if the needle is changed, sharing may result in passing of viruses like hepatitis or HIV. Wear a medical ID bracelet or chain. Carry a card that describes your condition. List the medications and doses you take on the card. What side effects may I notice from receiving this medication? Side effects that you should report to your care team as soon as possible: Allergic reactions--skin rash, itching, hives, swelling of the face, lips, tongue, or throat Change in vision Dehydration--increased thirst, dry mouth, feeling faint or lightheaded, headache, dark yellow or brown urine Kidney injury--decrease in the amount of urine, swelling of the ankles, hands, or feet Pancreatitis--severe stomach pain that spreads to your back or gets worse after eating or when touched, fever, nausea, vomiting Thyroid cancer--new mass or lump in the neck, pain or trouble swallowing, trouble breathing, hoarseness Side effects that usually do not require medical attention (  report to your care team if they continue or are bothersome): Diarrhea Loss of appetite Nausea Stomach pain Vomiting This list may not describe all possible side effects. Call your doctor for medical advice about side effects. You may report side effects to FDA at 1-800-FDA-1088. Where should I keep my medication? Keep out of the reach of children and pets. Refrigeration (preferred): Store unopened pens in a refrigerator between 2 and 8 degrees C (36 and 46 degrees F). Keep it in the original carton until you are ready to take it. Do not freeze or use if the  medication has been frozen. Protect from light. Get rid of any unused medication after the expiration date on the label. Room Temperature: The pen may be stored at room temperature below 30 degrees C (86 degrees F) for up to a total of 14 days if needed. Protect from light. Avoid exposure to extreme heat. If it is stored at room temperature, throw away any unused medication after 14 days or after it expires, whichever is first. To get rid of medications that are no longer needed or have expired: Take the medication to a medication take-back program. Check with your pharmacy or law enforcement to find a location. If you cannot return the medication, ask your pharmacist or care team how to get rid of this medication safely. NOTE: This sheet is a summary. It may not cover all possible information. If you have questions about this medicine, talk to your doctor, pharmacist, or health care provider.  2022 [employer]vier/Gold Standard (2020-08-26 00:00:00)

## 2021-05-13 ENCOUNTER — Other Ambulatory Visit: Payer: Self-pay | Admitting: Adult Health

## 2021-05-13 DIAGNOSIS — E1165 Type 2 diabetes mellitus with hyperglycemia: Secondary | ICD-10-CM | POA: Insufficient documentation

## 2021-05-13 LAB — COMPLETE METABOLIC PANEL WITH GFR
AG Ratio: 1.6 (calc) (ref 1.0–2.5)
ALT: 23 U/L (ref 6–29)
AST: 18 U/L (ref 10–35)
Albumin: 4.3 g/dL (ref 3.6–5.1)
Alkaline phosphatase (APISO): 74 U/L (ref 37–153)
BUN: 11 mg/dL (ref 7–25)
CO2: 25 mmol/L (ref 20–32)
Calcium: 9.6 mg/dL (ref 8.6–10.4)
Chloride: 105 mmol/L (ref 98–110)
Creat: 0.78 mg/dL (ref 0.50–1.03)
Globulin: 2.7 g/dL (calc) (ref 1.9–3.7)
Glucose, Bld: 214 mg/dL — ABNORMAL HIGH (ref 65–99)
Potassium: 3.9 mmol/L (ref 3.5–5.3)
Sodium: 140 mmol/L (ref 135–146)
Total Bilirubin: 0.7 mg/dL (ref 0.2–1.2)
Total Protein: 7 g/dL (ref 6.1–8.1)
eGFR: 89 mL/min/{1.73_m2} (ref >=60)

## 2021-05-13 LAB — CBC WITH DIFFERENTIAL/PLATELET
Absolute Monocytes: 348 cells/uL (ref 200–950)
Basophils Absolute: 18 cells/uL (ref 0–200)
Basophils Relative: 0.3 %
Eosinophils Absolute: 18 cells/uL (ref 15–500)
Eosinophils Relative: 0.3 %
HCT: 38.3 % (ref 35.0–45.0)
Hemoglobin: 12.3 g/dL (ref 11.7–15.5)
Lymphs Abs: 1493 cells/uL (ref 850–3900)
MCH: 27 pg (ref 27.0–33.0)
MCHC: 32.1 g/dL (ref 32.0–36.0)
MCV: 84.2 fL (ref 80.0–100.0)
MPV: 11.4 fL (ref 7.5–12.5)
Monocytes Relative: 5.9 %
Neutro Abs: 4024 cells/uL (ref 1500–7800)
Neutrophils Relative %: 68.2 %
Platelets: 206 10*3/uL (ref 140–400)
RBC: 4.55 10*6/uL (ref 3.80–5.10)
RDW: 12.4 % (ref 11.0–15.0)
Total Lymphocyte: 25.3 %
WBC: 5.9 10*3/uL (ref 3.8–10.8)

## 2021-05-13 LAB — HEMOGLOBIN A1C
Hgb A1c MFr Bld: 10.6 % of total Hgb — ABNORMAL HIGH (ref <5.7)
Mean Plasma Glucose: 258 mg/dL
eAG (mmol/L): 14.3 mmol/L

## 2021-05-13 LAB — LIPID PANEL
Cholesterol: 215 mg/dL — ABNORMAL HIGH (ref <200)
HDL: 53 mg/dL (ref >=50)
LDL Cholesterol (Calc): 139 mg/dL (calc) — ABNORMAL HIGH
Non-HDL Cholesterol (Calc): 162 mg/dL (calc) — ABNORMAL HIGH (ref <130)
Total CHOL/HDL Ratio: 4.1 (calc) (ref <5.0)
Triglycerides: 111 mg/dL (ref <150)

## 2021-05-13 LAB — MAGNESIUM: Magnesium: 1.8 mg/dL (ref 1.5–2.5)

## 2021-05-13 LAB — TSH: TSH: 1.22 mIU/L (ref 0.40–4.50)

## 2021-05-13 MED ORDER — ROSUVASTATIN CALCIUM 5 MG PO TABS: ORAL_TABLET | ORAL | 3 refills | Status: DC

## 2021-05-19 ENCOUNTER — Other Ambulatory Visit: Payer: Self-pay

## 2021-05-19 DIAGNOSIS — E1165 Type 2 diabetes mellitus with hyperglycemia: Secondary | ICD-10-CM

## 2021-05-19 MED ORDER — GLUCOSE BLOOD VI STRP: ORAL_STRIP | 12 refills | Status: AC

## 2021-05-23 ENCOUNTER — Encounter: Payer: Self-pay | Admitting: Adult Health

## 2021-06-20 DIAGNOSIS — S233XXA Sprain of ligaments of thoracic spine, initial encounter: Secondary | ICD-10-CM | POA: Diagnosis not present

## 2021-06-20 DIAGNOSIS — S338XXA Sprain of other parts of lumbar spine and pelvis, initial encounter: Secondary | ICD-10-CM | POA: Diagnosis not present

## 2021-06-20 DIAGNOSIS — S134XXA Sprain of ligaments of cervical spine, initial encounter: Secondary | ICD-10-CM | POA: Diagnosis not present

## 2021-06-27 DIAGNOSIS — S134XXA Sprain of ligaments of cervical spine, initial encounter: Secondary | ICD-10-CM | POA: Diagnosis not present

## 2021-06-27 DIAGNOSIS — S233XXA Sprain of ligaments of thoracic spine, initial encounter: Secondary | ICD-10-CM | POA: Diagnosis not present

## 2021-06-27 DIAGNOSIS — S338XXA Sprain of other parts of lumbar spine and pelvis, initial encounter: Secondary | ICD-10-CM | POA: Diagnosis not present

## 2021-07-04 DIAGNOSIS — S233XXA Sprain of ligaments of thoracic spine, initial encounter: Secondary | ICD-10-CM | POA: Diagnosis not present

## 2021-07-04 DIAGNOSIS — S338XXA Sprain of other parts of lumbar spine and pelvis, initial encounter: Secondary | ICD-10-CM | POA: Diagnosis not present

## 2021-07-04 DIAGNOSIS — S134XXA Sprain of ligaments of cervical spine, initial encounter: Secondary | ICD-10-CM | POA: Diagnosis not present

## 2021-07-13 DIAGNOSIS — S233XXA Sprain of ligaments of thoracic spine, initial encounter: Secondary | ICD-10-CM | POA: Diagnosis not present

## 2021-07-13 DIAGNOSIS — S338XXA Sprain of other parts of lumbar spine and pelvis, initial encounter: Secondary | ICD-10-CM | POA: Diagnosis not present

## 2021-07-13 DIAGNOSIS — S134XXA Sprain of ligaments of cervical spine, initial encounter: Secondary | ICD-10-CM | POA: Diagnosis not present

## 2021-07-29 ENCOUNTER — Other Ambulatory Visit: Payer: Self-pay | Admitting: Internal Medicine

## 2021-07-29 ENCOUNTER — Encounter: Payer: Self-pay | Admitting: Adult Health

## 2021-07-29 ENCOUNTER — Other Ambulatory Visit: Payer: Self-pay | Admitting: Adult Health

## 2021-07-29 DIAGNOSIS — E785 Hyperlipidemia, unspecified: Secondary | ICD-10-CM

## 2021-07-29 DIAGNOSIS — E1169 Type 2 diabetes mellitus with other specified complication: Secondary | ICD-10-CM

## 2021-07-29 MED ORDER — INSULIN DETEMIR 100 UNIT/ML FLEXPEN: PEN_INJECTOR | SUBCUTANEOUS | 11 refills | Status: DC

## 2021-08-25 NOTE — Progress Notes (Signed)
COMPLETE PHYSICAL ? ?Assessment and Plan: ? ?Encounter for general adult medical examination with abnormal findings ?Royetta Asal ? ?Essential hypertension ?Continue current medications: previously prescribed amlodipine 2.45m did not start, ?Monitor blood pressure at home; call if consistently over 130/80 ?Continue DASH diet.   ?Reminder to go to the ER if any CP, SOB, nausea, dizziness, severe HA, changes vision/speech, left arm numbness and tingling and jaw pain. ? ? ?Transient cerebral ischemia, unspecified type ?Control blood pressure, cholesterol, glucose, increase exercise.  ? ?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[place] ?-     CMP ? ?Hyperlipidemia, unspecified hyperlipidemia type ?Prescribed rosuvastatin 560m not taking this, discussed at length with patient ?Discussed dietary and exercise modifications ?Low fat diet ?-     Lipid panel ?- goal is less than 70 ? ?Hypokalemia ?-     CMP ?-     Magnesium ? ?Anemia, unspecified type ?- monitor, continue iron supp with Vitamin C and increase green leafy veggies ? ?Allergic state, subsequent encounter ?Taking OTC ? ?Endometriosis of rectovaginal septum ?History of, asymptomatic today ? ?Vitamin D deficiency ?Continue supplementation to maintain goal of 70-100 ?Taking Vitamin D 50,000 weekly IU daily ?-Vitamin D ? ?Type 2 diabetes mellitus with hyperlipidemia ([facility]?Taking Metformin 50063m2 tabs in am, 1 tab in PM ?Discussed general issues about diabetes pathophysiology and management.,  ?Suggested low cholesterol diet.,  ?Encouraged aerobic exercise.,  ?Discussed foot care.,  ?Reminded to get yearly retinal exam. ?Referral made to get diabetes nutrition counseling ? -     Hemoglobin A1c ? ?Diabetes mellitus type 2, insulin dependent (HCC[place]Taking Metformin 500m2m tabs in am, 1 tab in PM ?Discussed general issues about diabetes pathophysiology and management.,  ?Suggested low cholesterol diet.,  ?Encouraged aerobic exercise.,  ?Discussed foot care.,  ?Reminded to get yearly retinal  exam. ?Referral made to get diabetes nutrition counseling ?-     Hemoglobin A1c ? ?Obesity - BMI 30 ?Long discussion about weight loss, diet, and exercise ?Recommended diet heavy in fruits and veggies and low in animal meats, cheeses, and dairy products, appropriate calorie intake ?Patient will work on limiting simple carbs, increasing fiber, limiting red meat, increase fresh fruits and vegetables  ?Follow up at next visit  ? ?Screening for vaginal cancer ?Pap smear taken and submitted ? ?Screening for ischemic heart disease ?- EKG ? ?Screening for blood or protein in urine ?- Routine UA with reflex microscopic ?- Microalbumin/creatinine urine ratio ? ? ?Further disposition pending results if labs check today. Discussed med's effects and SE's.   ?Over 30 minutes of face to face interview, exam, counseling, chart review, and critical decision making was performed.  ? ? ?Discussed med's effects and SE's. Screening labs and tests as requested with regular follow-up as recommended. ?______________________________________________________________ ? ?HPI ?55 y59. female  presents for a complete physical. ? ? ? ?She also has been having mirgraine headaches.  She was taking tyleenol 1000mg62mch did dull the pain.  She is going to PT for headaches, has had one session and has improved and plans to continue this.  ? ?History of TIA.  Discussed importance of controlling glucose, cholesterol and blood pressure as this increases risks of another event. ? ? ?Her blood pressure has been controlled at home, today their BP is BP: 114/68 . She never started Amlodipine.  ?BP Readings from Last 3 Encounters:  ?08/29/21 114/68  ?05/12/21 132/88  ?09/01/20 134/90  ? ? ?She does workout, she has been walking. She denies chest pain, shortness of breath, dizziness.  ? ?BMI  is Body mass index is 30.03 kg/m?., she is working on diet and exercise. She does difficulty staying consistent preparing healthy meals.  Has been grabbing fast food  She  has started [employer]. She is interested in talking with a nutritionist. She is walking 2-3 times a week and walks for 3-5 miles.  ?Wt Readings from Last 3 Encounters:  ?08/29/21 194 lb 9.6 oz (88.3 kg)  ?05/12/21 190 lb (86.2 kg)  ?09/01/20 190 lb 9.6 oz (86.5 kg)  ? ?She is not on cholesterol medication and denies myalgias. Her cholesterol is not at goal, LDL of 70. The cholesterol last visit was:   ?Lab Results  ?Component Value Date  ? CHOL 215 (H) 05/12/2021  ? HDL 53 05/12/2021  ? LDLCALC 139 (H) 05/12/2021  ? TRIG 111 05/12/2021  ? CHOLHDL 4.1 05/12/2021  ? ?She has been working on diet and exercise for diabetes   ?She is concerned about food. She hasn't started on the Trulicity because she had previous bad joint pain with Ozempic.  ?Levemir 15-20 units 11am-1pm.  569m BID. ?Lowest fasting 135- 200's ?In the evening prior to be 140's-160's ?She use glucometer ?she is on bASA ?she is not on ACE/ARB ?Denies foot ulcerations, nausea, paresthesia of the feet and visual disturbances.   ?Last A1C in the office was:  ?Lab Results  ?Component Value Date  ? 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 GDarnell Levelfor this and DM, on spirolactone 523mBID and tolerates well.  ?Lab Results  ?Component Value Date  ? GFRAA 107 08/26/2020  ? ?Patient is on Vitamin D supplement, 1000 daily. .   ? ?Current Medications:  ?Current Outpatient Medications on File Prior to Visit  ?Medication Sig Dispense Refill  ? aspirin 81 MG EC tablet Take 81 mg by mouth every morning.  0  ? Cyanocobalamin (B-12 PO) Take 1 tablet by mouth daily. Reported on 12/14/2015    ? FOLIC ACID PO Take by mouth.    ? insulin detemir (LEVEMIR) 100 UNIT/ML FlexPen INJECT 14-20 UNITS DEPENDING ON MORNING SUGAR ONCE DAILY SUBCUTANEOUSLY. 15 mL 11  ? Iron TABS Take 1 tablet by mouth daily. Reported on 09/08/2015    ? Magnesium 250 MG TABS Take by mouth.    ? metFORMIN (GLUCOPHAGE-XR) 500 MG 24 hr tablet TAKE 1 TABLET BY MOUTH  TWICE A DAY WITH MEAL AS TOLERATED, FOR DIABETES (Patient taking differently: Takes two tablets in the morning and one tablet at night) 180 tablet PRN  ? spironolactone (ALDACTONE) 50 MG tablet Take 5028mn am and 23m35m pm 270 tablet 3  ? Vitamin D, Ergocalciferol, (DRISDOL) 1.25 MG (50000 UNIT) CAPS capsule Take 1 capsule (50,000 Units total) by mouth every 7 (seven) days. 12 capsule 7  ? amLODipine (NORVASC) 2.5 MG tablet TAKE 1 TABLET BY MOUTH EVERY DAY (Patient not taking: Reported on 05/12/2021) 90 tablet 1  ? Blood Glucose Monitoring Suppl (ONE TOUCH ULTRA MINI) w/Device KIT See admin instructions. for testing  0  ? Dulaglutide (TRULICITY) 0.757.400CX/4.4YJN Inject 0.75 mg into the skin once a week. (Patient not taking: Reported on 08/29/2021) 1 mL 0  ? glucose blood test strip Use as instructed 100 each 12  ? rosuvastatin (CRESTOR) 5 MG tablet Take 1 tab daily at night for cholesterol and heart attack prevention. (Patient not taking: Reported on 08/29/2021) 90 tablet 3  ? ?No current facility-administered medications on file prior to visit.  ? ?Health  Maintenance:   ?Immunization History  ?Administered Date(s) Administered  ? Moderna Sars-Covid-2 Vaccination 06/11/2020, 07/09/2020  ? Td 06/06/2007  ? ?Tetanus: 2009 ?Pneumovax: N/A ?Flu vaccine: Declines ?Zostavax: N/A ? ?Pap: Today 08/29/21 ?MGM: 02/2020 ?DEXA: N/A  ?Colonoscopy:  09/2019, adenomatous Q3years Dr Havery Moros 2024 due ?EGD: N/A  ?Ct HEAD 2017 ?MRI BRAIN 2016 ? ? ? ?Medical History:  ?Past Medical History:  ?Diagnosis Date  ? Adrenal adenoma   ? Allergy   ? Anemia   ? Diabetes mellitus without complication ([place])   ? Endometriosis of rectovaginal septum   ? GERD (gastroesophageal reflux disease)   ? Headache   ? MIGRAINES  ? Hyperaldosteronism ([place])   ? Possibly  ? Hyperlipidemia   ? Hypertension   ? Hypokalemia   ? Ovarian cyst   ? Raynaud disease   ? hands and feet   ? ?Allergies ?Allergies  ?Allergen Reactions  ? Ace Inhibitors Cough  ? Hctz  [Hydrochlorothiazide] Cough  ? ? ?SURGICAL HISTORY ?She  has a past surgical history that includes Cesarean section; Cardiac catheterization; Tubal ligation; and Robotic assisted total hysterectomy with bilateral sal

## 2021-08-29 ENCOUNTER — Ambulatory Visit (INDEPENDENT_AMBULATORY_CARE_PROVIDER_SITE_OTHER): Payer: 59 | Admitting: Nurse Practitioner

## 2021-08-29 ENCOUNTER — Encounter: Payer: Self-pay | Admitting: Nurse Practitioner

## 2021-08-29 ENCOUNTER — Other Ambulatory Visit: Payer: Self-pay

## 2021-08-29 VITALS — BP 114/68 | HR 82 | Temp 97.5°F | Ht 67.5 in | Wt 194.6 lb

## 2021-08-29 DIAGNOSIS — E559 Vitamin D deficiency, unspecified: Secondary | ICD-10-CM

## 2021-08-29 DIAGNOSIS — Z1389 Encounter for screening for other disorder: Secondary | ICD-10-CM

## 2021-08-29 DIAGNOSIS — Z136 Encounter for screening for cardiovascular disorders: Secondary | ICD-10-CM

## 2021-08-29 DIAGNOSIS — E119 Type 2 diabetes mellitus without complications: Secondary | ICD-10-CM | POA: Diagnosis not present

## 2021-08-29 DIAGNOSIS — Z1329 Encounter for screening for other suspected endocrine disorder: Secondary | ICD-10-CM | POA: Diagnosis not present

## 2021-08-29 DIAGNOSIS — Z79899 Other long term (current) drug therapy: Secondary | ICD-10-CM | POA: Diagnosis not present

## 2021-08-29 DIAGNOSIS — E785 Hyperlipidemia, unspecified: Secondary | ICD-10-CM | POA: Diagnosis not present

## 2021-08-29 DIAGNOSIS — Z0000 Encounter for general adult medical examination without abnormal findings: Secondary | ICD-10-CM

## 2021-08-29 DIAGNOSIS — D649 Anemia, unspecified: Secondary | ICD-10-CM

## 2021-08-29 DIAGNOSIS — E269 Hyperaldosteronism, unspecified: Secondary | ICD-10-CM

## 2021-08-29 DIAGNOSIS — Z794 Long term (current) use of insulin: Secondary | ICD-10-CM | POA: Diagnosis not present

## 2021-08-29 DIAGNOSIS — T7840XD Allergy, unspecified, subsequent encounter: Secondary | ICD-10-CM

## 2021-08-29 DIAGNOSIS — I10 Essential (primary) hypertension: Secondary | ICD-10-CM

## 2021-08-29 DIAGNOSIS — E876 Hypokalemia: Secondary | ICD-10-CM | POA: Diagnosis not present

## 2021-08-29 DIAGNOSIS — E1169 Type 2 diabetes mellitus with other specified complication: Secondary | ICD-10-CM | POA: Diagnosis not present

## 2021-08-29 DIAGNOSIS — Z1272 Encounter for screening for malignant neoplasm of vagina: Secondary | ICD-10-CM | POA: Diagnosis not present

## 2021-08-29 DIAGNOSIS — E6609 Other obesity due to excess calories: Secondary | ICD-10-CM

## 2021-08-29 DIAGNOSIS — G459 Transient cerebral ischemic attack, unspecified: Secondary | ICD-10-CM

## 2021-08-29 DIAGNOSIS — N804 Endometriosis of rectovaginal septum, unspecified involvement of vagina: Secondary | ICD-10-CM

## 2021-08-29 DIAGNOSIS — Z0001 Encounter for general adult medical examination with abnormal findings: Secondary | ICD-10-CM

## 2021-08-29 NOTE — Patient Instructions (Signed)

## 2021-08-30 LAB — HEMOGLOBIN A1C
Hgb A1c MFr Bld: 9.1 % of total Hgb — ABNORMAL HIGH (ref <5.7)
Mean Plasma Glucose: 214 mg/dL
eAG (mmol/L): 11.9 mmol/L

## 2021-08-30 LAB — URINALYSIS, ROUTINE W REFLEX MICROSCOPIC
Bilirubin Urine: NEGATIVE
Glucose, UA: NEGATIVE
Hgb urine dipstick: NEGATIVE
Ketones, ur: NEGATIVE
Leukocytes,Ua: NEGATIVE
Nitrite: NEGATIVE
Protein, ur: NEGATIVE
Specific Gravity, Urine: 1.022 (ref 1.001–1.035)
pH: 5.5 (ref 5.0–8.0)

## 2021-08-30 LAB — LIPID PANEL
Cholesterol: 207 mg/dL — ABNORMAL HIGH (ref <200)
HDL: 69 mg/dL (ref >=50)
LDL Cholesterol (Calc): 120 mg/dL (calc) — ABNORMAL HIGH
Non-HDL Cholesterol (Calc): 138 mg/dL (calc) — ABNORMAL HIGH (ref <130)
Total CHOL/HDL Ratio: 3 (calc) (ref <5.0)
Triglycerides: 79 mg/dL (ref <150)

## 2021-08-30 LAB — VITAMIN D 25 HYDROXY (VIT D DEFICIENCY, FRACTURES): Vit D, 25-Hydroxy: 89 ng/mL (ref 30–100)

## 2021-08-30 LAB — CBC WITH DIFFERENTIAL/PLATELET
Absolute Monocytes: 462 cells/uL (ref 200–950)
Basophils Absolute: 27 cells/uL (ref 0–200)
Basophils Relative: 0.4 %
Eosinophils Absolute: 41 cells/uL (ref 15–500)
Eosinophils Relative: 0.6 %
HCT: 39.9 % (ref 35.0–45.0)
Hemoglobin: 12.9 g/dL (ref 11.7–15.5)
Lymphs Abs: 2156 cells/uL (ref 850–3900)
MCH: 27.5 pg (ref 27.0–33.0)
MCHC: 32.3 g/dL (ref 32.0–36.0)
MCV: 85.1 fL (ref 80.0–100.0)
MPV: 11.6 fL (ref 7.5–12.5)
Monocytes Relative: 6.8 %
Neutro Abs: 4114 cells/uL (ref 1500–7800)
Neutrophils Relative %: 60.5 %
Platelets: 211 10*3/uL (ref 140–400)
RBC: 4.69 10*6/uL (ref 3.80–5.10)
RDW: 12.8 % (ref 11.0–15.0)
Total Lymphocyte: 31.7 %
WBC: 6.8 10*3/uL (ref 3.8–10.8)

## 2021-08-30 LAB — COMPLETE METABOLIC PANEL WITH GFR
AG Ratio: 1.7 (calc) (ref 1.0–2.5)
ALT: 17 U/L (ref 6–29)
AST: 14 U/L (ref 10–35)
Albumin: 4.8 g/dL (ref 3.6–5.1)
Alkaline phosphatase (APISO): 78 U/L (ref 37–153)
BUN: 10 mg/dL (ref 7–25)
CO2: 25 mmol/L (ref 20–32)
Calcium: 10.1 mg/dL (ref 8.6–10.4)
Chloride: 106 mmol/L (ref 98–110)
Creat: 0.78 mg/dL (ref 0.50–1.03)
Globulin: 2.9 g/dL (calc) (ref 1.9–3.7)
Glucose, Bld: 157 mg/dL — ABNORMAL HIGH (ref 65–99)
Potassium: 4.2 mmol/L (ref 3.5–5.3)
Sodium: 139 mmol/L (ref 135–146)
Total Bilirubin: 0.7 mg/dL (ref 0.2–1.2)
Total Protein: 7.7 g/dL (ref 6.1–8.1)
eGFR: 89 mL/min/{1.73_m2} (ref >=60)

## 2021-08-30 LAB — MICROALBUMIN / CREATININE URINE RATIO
Creatinine, Urine: 194 mg/dL (ref 20–275)
Microalb Creat Ratio: 4 mcg/mg creat (ref <30)
Microalb, Ur: 0.8 mg/dL

## 2021-08-30 LAB — MAGNESIUM: Magnesium: 1.9 mg/dL (ref 1.5–2.5)

## 2021-08-30 LAB — TSH: TSH: 1.75 mIU/L (ref 0.40–4.50)

## 2021-08-31 LAB — PAP, TP IMAGING W/ HPV RNA, RFLX HPV TYPE 16,18/45: HPV DNA High Risk: NOT DETECTED

## 2021-08-31 LAB — PAP, TP IMAGING, WNL RFLX HPV

## 2021-09-30 ENCOUNTER — Encounter: Payer: Self-pay | Admitting: Adult Health

## 2021-10-20 ENCOUNTER — Encounter: Payer: 59 | Attending: Internal Medicine | Admitting: Nutrition

## 2021-10-20 VITALS — Ht 67.5 in | Wt 195.0 lb

## 2021-10-20 DIAGNOSIS — E785 Hyperlipidemia, unspecified: Secondary | ICD-10-CM | POA: Insufficient documentation

## 2021-10-20 DIAGNOSIS — Z713 Dietary counseling and surveillance: Secondary | ICD-10-CM | POA: Diagnosis not present

## 2021-10-20 DIAGNOSIS — Z8673 Personal history of transient ischemic attack (TIA), and cerebral infarction without residual deficits: Secondary | ICD-10-CM | POA: Insufficient documentation

## 2021-10-20 DIAGNOSIS — E1065 Type 1 diabetes mellitus with hyperglycemia: Secondary | ICD-10-CM

## 2021-10-20 DIAGNOSIS — Z794 Long term (current) use of insulin: Secondary | ICD-10-CM | POA: Diagnosis not present

## 2021-10-20 DIAGNOSIS — E1169 Type 2 diabetes mellitus with other specified complication: Secondary | ICD-10-CM

## 2021-10-20 DIAGNOSIS — I679 Cerebrovascular disease, unspecified: Secondary | ICD-10-CM

## 2021-10-20 DIAGNOSIS — E66812 Obesity, class 2: Secondary | ICD-10-CM

## 2021-10-20 NOTE — Progress Notes (Signed)
Medical Nutrition Therapy  1520  Appointment End time:  1630  Primary concerns today: DM and Obesity  Referral diagnosis: E11.8, E66.9 Preferred learning style: Visual , no preference . Learning readiness: ready    NUTRITION ASSESSMENT  14 y  old bfemale here for Uncontrolled Type 2 DM.  PMH: HTN, CVA, Hyperlipidemia, Obesity Levermi 17 units in am and Metformin XR 1500 FBS  200-220's   Evening time:  170-180's.  PCP is Dr. Melford Aase. Hasn't met with a dietitian or diabetes educator before.  Uncontrolled Type 2 DM with A1C 9.1%. She wants to make changes and willing to follow Lifestyle Medicine to help reduce her DM and co morbidities. Current eating habits are insuffient to meet her needs and control her blood sugars.  Recommend to limit Metformin xr 1000 mg per day. May need to increase Levermir  slowly to get the FBS less than 130's. We will have to see how much changes her eating habits make first on her blood sugars.   Anthropometrics  Wt Readings from Last 3 Encounters:  10/20/21 195 lb (88.5 kg)  08/29/21 194 lb 9.6 oz (88.3 kg)  05/12/21 190 lb (86.2 kg)   Ht Readings from Last 3 Encounters:  10/20/21 5' 7.5" (1.715 m)  08/29/21 5' 7.5" (1.715 m)  09/01/20 5' 7.5" (1.715 m)   Body mass index is 30.09 kg/m. '@BMIFA' @ Facility age limit for growth percentiles is 20 years. Facility age limit for growth percentiles is 20 years.    Clinical Medical Hx: See chart Medications: Levermi 17 units in am and Metformin XR 1500 Labs:  Lab Results  Component Value Date   HGBA1C 9.1 (H) 08/29/2021    Notable Signs/Symptoms: Neuropathy  Lifestyle & Dietary Hx Lives by herself. Eating foods outside of the home. Eats 2 meals per day.   Estimated daily VIt D, Magneium, iron Fluid intake: 90 oz Supplements:  Sleep: 8 Stress / self-care: None Current average weekly physical activity: Goes to gym and walking a few times per week  24-Hr Dietary Recall First Meal:  skipped Snack:  Second Meal: skipped; usually eats out Snack: Regular Dr. Malachi Bonds Third Meal: BBQ sandwich, fries and corndog or usually has a salad-salad kit from [employer]. Snack: Sliced apple, PB and celery Beverages: water  Estimated Energy Needs Calories: 1200 Carbohydrate: 135g Protein: 90g Fat: 33g   NUTRITION DIAGNOSIS  NB-1.1 Food and nutrition-related knowledge deficit As related to Diabetes Type 2.  As evidenced by A1C 9.1%.   NUTRITION INTERVENTION  Nutrition education (E-1) on the following topics:  Nutrition and Diabetes education provided on My Plate, CHO counting, meal planning, portion sizes, timing of meals, avoiding snacks between meals unless having a low blood sugar, target ranges for A1C and blood sugars, signs/symptoms and treatment of hyper/hypoglycemia, monitoring blood sugars, taking medications as prescribed, benefits of exercising 30 minutes per day and prevention of complications of DM. Lifestyle Medicine  - Whole Food, Plant Predominant Nutrition is highly recommended: Eat Plenty of vegetables, Mushrooms, fruits, Legumes, Whole Grains, Nuts, seeds in lieu of processed meats, processed snacks/pastries red meat, poultry, eggs.    -It is better to avoid simple carbohydrates including: Cakes, Sweet Desserts, Ice Cream, Soda (diet and regular), Sweet Tea, Candies, Chips, Cookies, Store Bought Juices, Alcohol in Excess of  1-2 drinks a day, Lemonade,  Artificial Sweeteners, Doughnuts, Coffee Creamers, "Sugar-free" Products, etc, etc.  This is not a complete list.....  Exercise: If you are able: 30 -60 minutes a day ,4 days a week,  or 150 minutes a week.  The longer the better.  Combine stretch, strength, and aerobic activities.  If you were told in the past that you have high risk for cardiovascular diseases, you may seek evaluation by your heart doctor prior to initiating moderate to intense exercise programs.   Handouts Provided Include  LIfestyle Handouts Meal  Plan Card  Learning Style & Readiness for Change Teaching method utilized: Visual & Auditory  Demonstrated degree of understanding via: Teach Back  Barriers to learning/adherence to lifestyle change: none  Goals Established by Pt   Eat three meals per day Keep drinking water Exercise 150 minutes a week Eat 30-45 g CHO at meals Increase fresh fruits and vegetables Cut out sodas, sweet and processed  foods Get FBS below 130 in am and less than1 50 at bedtime Get A1C down to 7%  MONITORING & EVALUATION Dietary intake, weekly physical activity, and blood sugars  in 1 month3.  Recommend to limit Metformin xr 1000 mg per day. May need to increase Levermir  slowly to get the FBS less than 130's. We will have to see how much changes her eating habits make first.   Next Steps  Patient is to work on meal planning and meal prepping.Marland Kitchen

## 2021-10-20 NOTE — Patient Instructions (Signed)
Goals  Eat three meals per day Keep drinking water Exercise 150 minutes a week Eat 30-45 g CHO at meals Increase fresh fruits and vegetables Cut out sodas, sweet and processed  foods Get FBS below 130 in am and less than1 50 at bedtime Get A1C down to 7%

## 2021-10-25 DIAGNOSIS — J029 Acute pharyngitis, unspecified: Secondary | ICD-10-CM | POA: Diagnosis not present

## 2021-10-29 DIAGNOSIS — R070 Pain in throat: Secondary | ICD-10-CM | POA: Diagnosis not present

## 2021-11-03 ENCOUNTER — Encounter: Payer: Self-pay | Admitting: Nutrition

## 2021-11-29 NOTE — Progress Notes (Signed)
FOLLOW UP  Assessment and Plan:   Essential hypertension Continue current medications; restart previously prescribed amlodipine 2.5mg  Monitor blood pressure at home; call if consistently over 130/80 Continue DASH diet.   Reminder to go to the ER if any CP, SOB, nausea, dizziness, severe HA, changes vision/speech, left arm numbness and tingling and jaw pain - CBC   Transient cerebral ischemia, unspecified type Control blood pressure, cholesterol, glucose, increase exercise.   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  Perform daily foot/skin check, notify office of any concerning changes.  Check A1C   Hyperlipidemia associated with T2DM (HCC) Discussed LDL goal <70, cardiovascular risks Discussed and patient verbal agreement with plan to restart rosuvastatin 5 mg with slow taper plan; some anxiety about possible SE Discussed dietary and exercise modifications -     Lipid panel  Obesity Long discussion about weight loss, diet, and exercise Recommended diet heavy in fruits and veggies and low in animal meats, cheeses, and dairy products, appropriate calorie intake Patient will work on increasing activity, decrease saturated fats and  simple carbs Follow up at next visit  Tinnitus/ear pain Use Mucinex as needed If tinnitus worsens will refer to ENT  Further disposition pending results if labs check today. Discussed med's effects and SE's.   Over 30 minutes of face to face interview, exam, counseling, chart review, and critical decision making was performed.   Future Appointments  Date Time Provider Department Center  12/13/2021  3:30 PM Crumpton, Mary D, RD [facility] None  08/30/2022  3:00 PM Wilkinson, Dana E, NP [facility] None   ______________________________________________________________  HPI 55 y.o. AA female  presents for overdue follow up for T2DM, htn, hld, weight.  History of TIA.  Had follow up with Dr. Willis earlier in 2022.   High pitch ringing in her ears for several months- is intermittent.  Having pain in her right ear- Feels congested.    BMI is Body mass index is 29.87 kg/m., she has been working on diet and exercise. She is walking 2-3 times a week and walks for 3-5 miles. She just got back from a cruise Wt Readings from Last 3 Encounters:  11/30/21 193 lb 9.6 oz (87.8 kg)  10/20/21 195 lb (88.5 kg)  08/29/21 194 lb 9.6 oz (88.3 kg)   Her blood pressure has not been controlled at home 130's/80's, today their BP is BP: 134/82  BP Readings from Last 3 Encounters:  11/30/21 134/82  08/29/21 114/68  05/12/21 132/88  She does workout, she has been walking. She denies chest pain, shortness of breath, dizziness.    She is not on cholesterol medication (has been prescribed rosuvastatin 5 mg in the past and does report tolerated, but stopped, fear of SE) and denies   myalgias. Her cholesterol is not at goal, LDL of 70. The cholesterol last visit was:   Lab Results  Component Value Date   CHOL 207 (H) 08/29/2021   HDL 69 08/29/2021   LDLCALC 120 (H) 08/29/2021   TRIG 79 08/29/2021   CHOLHDL 3.0 08/29/2021   She has been working on diet and exercise for diabetes   She has been taking  metformin 500 mg 2 tabs with breakfast, 1 tab with dinner;  She hasn't started on the Trulicity because she had previous bad joint pain with Ozempic. Has been working with nutritionist Levemir 18 units in PM Fasting: 135-160 in the last 2 months  Meter: Dexcom she is not on bASA - discussed and receptive to restart she is not on ACE/ARB Denies foot ulcerations, nausea, paresthesia of the feet and visual disturbances.   She has not had diabetes eye but will schedule and requested report be forwarded.  Last A1C in the office was:  Lab Results  Component Value Date   HGBA1C 9.1 (H) 08/29/2021   Has possible history of aldosteronism, follows with Dr. G for this and DM, on spirolactone 50mg BID and tolerates well.  Lab Results  Component Value Date   GFRAA 107 08/26/2020   Patient is on Vitamin D supplement, 1000 daily.    Current Medications:  Current Outpatient Medications on File Prior to Visit  Medication Sig Dispense Refill   aspirin 81 MG EC tablet Take 81 mg by mouth every morning.  0   Cyanocobalamin (B-12 PO) Take 1 tablet by mouth daily. Reported on 12/14/2015     FOLIC ACID PO Take by mouth.     insulin detemir (LEVEMIR) 100 UNIT/ML FlexPen INJECT 14-20 UNITS DEPENDING ON MORNING SUGAR ONCE DAILY SUBCUTANEOUSLY. 15 mL 11   Iron TABS Take 1 tablet by mouth daily. Reported on 09/08/2015     Magnesium 250 MG TABS Take by mouth.     metFORMIN (GLUCOPHAGE-XR) 500 MG 24 hr tablet TAKE 1 TABLET BY MOUTH TWICE A DAY WITH MEAL AS TOLERATED, FOR DIABETES (Patient taking differently: Takes two tablets in the morning and one tablet at night) 180 tablet PRN   spironolactone (ALDACTONE) 50 MG tablet Take 50mg in am and 50mg in pm 270 tablet 3   Vitamin D, Ergocalciferol, (DRISDOL) 1.25 MG (50000 UNIT) CAPS capsule Take 1 capsule (50,000 Units total) by mouth every 7 (seven) days. 12 capsule 7   Blood Glucose Monitoring Suppl (ONE TOUCH ULTRA MINI) w/Device KIT See admin instructions. for  testing  0   glucose blood test strip Use as instructed 100 each 12   No current facility-administered medications on file prior to visit.    Medical History:  Past Medical History:  Diagnosis Date   Adrenal adenoma    Allergy    Anemia    Diabetes mellitus without complication (HCC)    Endometriosis of rectovaginal septum    GERD (gastroesophageal reflux disease)    Headache    MIGRAINES   Hyperaldosteronism (HCC)    Possibly   Hyperlipidemia    Hypertension    Hypokalemia    Ovarian cyst    Raynaud disease    hands and feet    Allergies Allergies  Allergen Reactions   Ace Inhibitors Cough   Hctz [Hydrochlorothiazide] Cough    SURGICAL HISTORY She  has a past surgical history that includes Cesarean section; Cardiac catheterization; Tubal ligation; and Robotic assisted total hysterectomy with bilateral salpingo oophorectomy (Bilateral, 06/16/2014). FAMILY HISTORY Her   family history includes Colon cancer (age of onset: 68) in her father; Diabetes in her father and mother; Hepatitis C in her father; Lupus in her maternal aunt and paternal aunt; Pancreatic cancer in her father; Scleroderma in her maternal grandmother; Stroke in her brother, maternal grandfather, and mother. SOCIAL HISTORY She  reports that she has never smoked. She has never used smokeless tobacco. She reports current alcohol use. She reports that she does not use drugs.  Review of Systems  Constitutional:  Negative for malaise/fatigue and weight loss.  HENT:  Positive for tinnitus. Negative for hearing loss.   Eyes:  Negative for blurred vision and double vision.  Respiratory:  Negative for cough, shortness of breath and wheezing.   Cardiovascular:  Negative for chest pain, palpitations, orthopnea, claudication and leg swelling.  Gastrointestinal:  Positive for diarrhea (intermittent with metformin). Negative for abdominal pain, blood in stool, constipation, heartburn, melena, nausea and vomiting.   Genitourinary: Negative.   Musculoskeletal:  Negative for joint pain and myalgias.  Skin:  Negative for rash.  Neurological:  Positive for tingling (intermittent of feet). Negative for dizziness, sensory change, weakness and headaches.  Endo/Heme/Allergies:  Negative for polydipsia.  Psychiatric/Behavioral: Negative.    All other systems reviewed and are negative.     Physical Exam: Estimated body mass index is 29.87 kg/m as calculated from the following:   Height as of 10/20/21: 5' 7.5" (1.715 m).   Weight as of this encounter: 193 lb 9.6 oz (87.8 kg). BP 134/82   Pulse 63   Temp 97.9 F (36.6 C)   Wt 193 lb 9.6 oz (87.8 kg)   LMP 05/16/2014   SpO2 97%   BMI 29.87 kg/m  General Appearance: Well nourished, in no apparent distress. Eyes: PERRLA, EOMs, conjunctiva no swelling or erythema Sinuses: No Frontal/maxillary tenderness ENT/Mouth: Ext aud canals clear, normal light reflex with TMs without erythema, bulging.  Good dentition. No erythema, swelling, or exudate on post pharynx. Tonsils not swollen or erythematous. Hearing normal.  Neck: Supple, thyroid normal. No bruits Respiratory: Respiratory effort normal, BS equal bilaterally without rales, rhonchi, wheezing or stridor. Cardio: RRR without murmurs, rubs or gallops. Brisk peripheral pulses without edema.  Abdomen: Soft, +BS. Non tender, no guarding, rebound, hernias, masses, or organomegaly. .  Lymphatics: Non tender without lymphadenopathy.  Musculoskeletal: Full ROM all peripheral extremities,5/5 strength, and normal gait. Skin: Warm, dry without rashes, lesions, ecchymosis.  Neuro: Cranial nerves intact, reflexes equal bilaterally. Normal muscle tone, no cerebellar symptoms.  Psych: Awake and oriented X 3, normal affect, Insight and Judgment appropriate.    DANA E WILKINSON, NP 9:39 AM [facility]       

## 2021-11-30 ENCOUNTER — Encounter: Payer: Self-pay | Admitting: Nurse Practitioner

## 2021-11-30 ENCOUNTER — Ambulatory Visit (INDEPENDENT_AMBULATORY_CARE_PROVIDER_SITE_OTHER): Payer: 59 | Admitting: Nurse Practitioner

## 2021-11-30 VITALS — BP 134/82 | HR 63 | Temp 97.9°F | Wt 193.6 lb

## 2021-11-30 DIAGNOSIS — E559 Vitamin D deficiency, unspecified: Secondary | ICD-10-CM

## 2021-11-30 DIAGNOSIS — Z79899 Other long term (current) drug therapy: Secondary | ICD-10-CM

## 2021-11-30 DIAGNOSIS — I10 Essential (primary) hypertension: Secondary | ICD-10-CM | POA: Diagnosis not present

## 2021-11-30 DIAGNOSIS — Z6830 Body mass index (BMI) 30.0-30.9, adult: Secondary | ICD-10-CM | POA: Diagnosis not present

## 2021-11-30 DIAGNOSIS — E6609 Other obesity due to excess calories: Secondary | ICD-10-CM

## 2021-11-30 DIAGNOSIS — Z794 Long term (current) use of insulin: Secondary | ICD-10-CM

## 2021-11-30 DIAGNOSIS — E269 Hyperaldosteronism, unspecified: Secondary | ICD-10-CM

## 2021-11-30 DIAGNOSIS — E0822 Diabetes mellitus due to underlying condition with diabetic chronic kidney disease: Secondary | ICD-10-CM

## 2021-11-30 DIAGNOSIS — E1169 Type 2 diabetes mellitus with other specified complication: Secondary | ICD-10-CM | POA: Diagnosis not present

## 2021-11-30 DIAGNOSIS — N182 Chronic kidney disease, stage 2 (mild): Secondary | ICD-10-CM

## 2021-11-30 DIAGNOSIS — G459 Transient cerebral ischemic attack, unspecified: Secondary | ICD-10-CM

## 2021-11-30 DIAGNOSIS — E785 Hyperlipidemia, unspecified: Secondary | ICD-10-CM

## 2021-12-01 ENCOUNTER — Other Ambulatory Visit: Payer: Self-pay | Admitting: Nurse Practitioner

## 2021-12-01 DIAGNOSIS — E1169 Type 2 diabetes mellitus with other specified complication: Secondary | ICD-10-CM

## 2021-12-01 LAB — COMPLETE METABOLIC PANEL WITH GFR
AG Ratio: 1.7 (calc) (ref 1.0–2.5)
ALT: 16 U/L (ref 6–29)
AST: 15 U/L (ref 10–35)
Albumin: 4.5 g/dL (ref 3.6–5.1)
Alkaline phosphatase (APISO): 73 U/L (ref 37–153)
BUN: 10 mg/dL (ref 7–25)
CO2: 25 mmol/L (ref 20–32)
Calcium: 9.6 mg/dL (ref 8.6–10.4)
Chloride: 106 mmol/L (ref 98–110)
Creat: 0.79 mg/dL (ref 0.50–1.03)
Globulin: 2.6 g/dL (calc) (ref 1.9–3.7)
Glucose, Bld: 170 mg/dL — ABNORMAL HIGH (ref 65–99)
Potassium: 4.4 mmol/L (ref 3.5–5.3)
Sodium: 141 mmol/L (ref 135–146)
Total Bilirubin: 0.6 mg/dL (ref 0.2–1.2)
Total Protein: 7.1 g/dL (ref 6.1–8.1)
eGFR: 88 mL/min/{1.73_m2} (ref >=60)

## 2021-12-01 LAB — LIPID PANEL
Cholesterol: 220 mg/dL — ABNORMAL HIGH (ref <200)
HDL: 72 mg/dL (ref >=50)
LDL Cholesterol (Calc): 131 mg/dL (calc) — ABNORMAL HIGH
Non-HDL Cholesterol (Calc): 148 mg/dL (calc) — ABNORMAL HIGH (ref <130)
Total CHOL/HDL Ratio: 3.1 (calc) (ref <5.0)
Triglycerides: 80 mg/dL (ref <150)

## 2021-12-01 LAB — CBC WITH DIFFERENTIAL/PLATELET
Absolute Monocytes: 467 cells/uL (ref 200–950)
Basophils Absolute: 32 cells/uL (ref 0–200)
Basophils Relative: 0.5 %
Eosinophils Absolute: 58 cells/uL (ref 15–500)
Eosinophils Relative: 0.9 %
HCT: 41.5 % (ref 35.0–45.0)
Hemoglobin: 13.3 g/dL (ref 11.7–15.5)
Lymphs Abs: 1651 cells/uL (ref 850–3900)
MCH: 27.2 pg (ref 27.0–33.0)
MCHC: 32 g/dL (ref 32.0–36.0)
MCV: 84.9 fL (ref 80.0–100.0)
MPV: 11.3 fL (ref 7.5–12.5)
Monocytes Relative: 7.3 %
Neutro Abs: 4192 cells/uL (ref 1500–7800)
Neutrophils Relative %: 65.5 %
Platelets: 200 10*3/uL (ref 140–400)
RBC: 4.89 10*6/uL (ref 3.80–5.10)
RDW: 12.3 % (ref 11.0–15.0)
Total Lymphocyte: 25.8 %
WBC: 6.4 10*3/uL (ref 3.8–10.8)

## 2021-12-01 LAB — HEMOGLOBIN A1C
Hgb A1c MFr Bld: 8.7 % of total Hgb — ABNORMAL HIGH (ref <5.7)
Mean Plasma Glucose: 203 mg/dL
eAG (mmol/L): 11.2 mmol/L

## 2021-12-01 MED ORDER — ROSUVASTATIN CALCIUM 5 MG PO TABS: 5.0000 mg | ORAL_TABLET | Freq: Every day | ORAL | 11 refills | Status: DC

## 2021-12-13 ENCOUNTER — Encounter: Payer: Self-pay | Admitting: Nutrition

## 2021-12-13 ENCOUNTER — Encounter: Payer: 59 | Attending: Internal Medicine | Admitting: Nutrition

## 2021-12-13 VITALS — Ht 67.5 in | Wt 187.0 lb

## 2021-12-13 DIAGNOSIS — E1169 Type 2 diabetes mellitus with other specified complication: Secondary | ICD-10-CM | POA: Insufficient documentation

## 2021-12-13 DIAGNOSIS — I10 Essential (primary) hypertension: Secondary | ICD-10-CM | POA: Insufficient documentation

## 2021-12-13 DIAGNOSIS — E0865 Diabetes mellitus due to underlying condition with hyperglycemia: Secondary | ICD-10-CM | POA: Insufficient documentation

## 2021-12-13 DIAGNOSIS — E785 Hyperlipidemia, unspecified: Secondary | ICD-10-CM | POA: Insufficient documentation

## 2021-12-13 NOTE — Patient Instructions (Addendum)
Goals  Exercise 150 minutes a week of exercise; walking and ellipical. Cut out sodas, sweet and processed foods. Increase fresh fruits and vegetables. Choose carrots and celery when stressed. Choose unsweet tea Get A1C down to 7%

## 2021-12-13 NOTE — Progress Notes (Signed)
Medical Nutrition Therapy  1610  Appointment End time:  1600  Primary concerns today: DM and Obesity  Referral diagnosis: E11.8, E66.9 Preferred learning style: Visual , no preference . Learning readiness: ready    NUTRITION ASSESSMENT DM Follow up This visit was completed via telephone due to the COVID-19 pandemic.   I spoke with  and verified that I was speaking with the correct person with two patient Kristin Hess 02/22/1966 (full name and date of birth).   I discussed the limitations related to this kind of visit and the patient is willing to proceed.  46 y  old bfemale here for Uncontrolled Type 2 DM.  PMH: HTN, CVA, Hyperlipidemia, Obesity Levermi er 17 units  at night and Metformin XR 1500. Taking insulin at night has improved blood sugars a lot. FBS  145-200;s when she had covid. Evening time:  111-170's  PCP is Dr. Melford Aase. Has missed night time insulin a few times but not often..   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 His health is declining. Had COVID and was on a lot of steroids. Trying to get back on track with eating meals on time and better food choices. Admits her weakness is sodas  Is engaged and motivated to work on her food choices and behaviors to improve her blood sugars.  Recommend to limit Metformin xr 1000 mg per day. May need to increase Levermir  slowly to get the FBS less than 130's.  Anthropometrics  Wt Readings from Last 3 Encounters:  11/30/21 193 lb 9.6 oz (87.8 kg)  10/20/21 195 lb (88.5 kg)  08/29/21 194 lb 9.6 oz (88.3 kg)   Ht Readings from Last 3 Encounters:  10/20/21 5' 7.5" (1.715 m)  08/29/21 5' 7.5" (1.715 m)  09/01/20 5' 7.5" (1.715 m)   There is no height or weight on file to calculate BMI. '@BMIFA' @ Facility age limit for growth %iles is 20 years. Facility age limit for growth %iles is 20 years.    Clinical Medical Hx: See chart Medications: Levermi 17 units in am and Metformin XR  1500 Labs:  Lab Results  Component Value Date   HGBA1C 8.7 (H) 11/30/2021    Notable Signs/Symptoms: Neuropathy  Lifestyle & Dietary Hx Lives by herself. Eating foods outside of the home. Eats 2 meals per day.   Estimated daily VIt D, Magneium, iron Fluid intake: 90 oz Supplements:  Sleep: 8 Stress / self-care: None Current average weekly physical activity: Goes to gym and walking a few times per week  24-Hr Dietary Recall First Meal: skipped Snack:  Second Meal: skipped; usually eats out Snack: Regular Dr. Malachi Bonds Third Meal: BBQ sandwich, fries and corndog or usually has a salad-salad kit from [employer]. Snack: Sliced apple, PB and celery Beverages: water  Estimated Energy Needs Calories: 1200 Carbohydrate: 135g Protein: 90g Fat: 33g   NUTRITION DIAGNOSIS  NB-1.1 Food and nutrition-related knowledge deficit As related to Diabetes Type 2.  As evidenced by A1C 9.1%.   NUTRITION INTERVENTION  Nutrition education (E-1) on the following topics:  Nutrition and Diabetes education provided on My Plate, CHO counting, meal planning, portion sizes, timing of meals, avoiding snacks between meals unless having a low blood sugar, target ranges for A1C and blood sugars, signs/symptoms and treatment of hyper/hypoglycemia, monitoring blood sugars, taking medications as prescribed, benefits of exercising 30 minutes per day and prevention of complications of DM. Lifestyle Medicine  - Whole Food, Plant Predominant Nutrition  is highly recommended: Eat Plenty of vegetables, Mushrooms, fruits, Legumes, Whole Grains, Nuts, seeds in lieu of processed meats, processed snacks/pastries red meat, poultry, eggs.    -It is better to avoid simple carbohydrates including: Cakes, Sweet Desserts, Ice Cream, Soda (diet and regular), Sweet Tea, Candies, Chips, Cookies, Store Bought Juices, Alcohol in Excess of  1-2 drinks a day, Lemonade,  Artificial Sweeteners, Doughnuts, Coffee Creamers, "Sugar-free"  Products, etc, etc.  This is not a complete list.....  Exercise: If you are able: 30 -60 minutes a day ,4 days a week, or 150 minutes a week.  The longer the better.  Combine stretch, strength, and aerobic activities.  If you were told in the past that you have high risk for cardiovascular diseases, you may seek evaluation by your heart doctor prior to initiating moderate to intense exercise programs.   Handouts Provided Include  LIfestyle Handouts Meal Plan Card  Learning Style & Readiness for Change Teaching method utilized: Visual & Auditory  Demonstrated degree of understanding via: Teach Back  Barriers to learning/adherence to lifestyle change: none  Goals Established by Pt   Eat three meals per day- work in progress., usually 2 meals per day. Keep drinking water - done 60-80  oz per day. Exercise 150 minutes a week-work in progress Eat 30-45 g CHO at meals - hasn't counted carbs, but has been consistent with lower carb vegetables. Increase fresh fruits and vegetables-done Cut out sodas, sweet and processed  foods-work on progress. Get FBS below 130 in am and less than1 50 at bedtime- Get A1C down to 7%  MONITORING & EVALUATION Dietary intake, weekly physical activity, and blood sugars  in 1 month3.  Recommend to limit Metformin xr 1000 mg per day. May need to increase Levermir  slowly to get the FBS less than 130's. We will have to see how much changes her eating habits make first.   Next Steps  Patient is to work on meal planning and meal prepping.Marland Kitchen

## 2022-03-07 ENCOUNTER — Other Ambulatory Visit: Payer: Self-pay

## 2022-03-07 DIAGNOSIS — E1169 Type 2 diabetes mellitus with other specified complication: Secondary | ICD-10-CM

## 2022-03-07 MED ORDER — BD PEN NEEDLE MICRO U/F 32G X 6 MM MISC: 2 refills | Status: DC

## 2022-03-08 NOTE — Progress Notes (Signed)
FOLLOW UP  Assessment and Plan:   Essential hypertension Continue current medications; restart previously prescribed amlodipine 2.42m  Monitor blood pressure at home; call if consistently over 130/80 Continue DASH diet.   Reminder to go to the ER if any CP, SOB, nausea, dizziness, severe HA, changes vision/speech, left arm numbness and tingling and jaw pain - CBC   Transient cerebral ischemia, unspecified type Control blood pressure, cholesterol, glucose, increase exercise.   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HArapah[place] Dr. GCruzita Ledererfollows -     COMPLETE METABOLIC PANEL WITH GFR  Type 2 diabetes mellitus with hyperlipidemia ([facility]. Type 2 Diabetes Mellitus insulin dependent(HCC) Taking Metformin 5034mthree tabs daily ( 2 in am and 1 in pm) Had not taken her Levemir for 2 weeks because did not have needles Hx of ozempc with SE;  Discussed general issues about diabetes pathophysiology and management.,  Suggested low cholesterol diet.,  Encouraged aerobic exercise.,  Discussed foot care.,  Reminded to get yearly retinal exam. Encouraged to maintain q3-4 month OVs until improved control  -     Hemoglobin A1c  Type 2 Diabetes Mellitus with stage 2 CKD with long term current use of insulin Continue medications:Restart Levemir 10 units QPM and monitor BS closely, Metformin 500 mg BID Continue diet and exercise.  Perform daily foot/skin check, notify office of any concerning changes.  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(HCC) Increase fluids, avoid NSAIDS, monitor sugars, will monitor  Hyperlipidemia associated with T2DM (HCC) Discussed LDL goal <70, cardiovascular risks She was prescribed Rosuvastatin 5 mg but declined taking the medication- strongly encouraged to take Red Yeast Rice and Omega 3 Fish oil Discussed dietary and exercise modifications -     Lipid panel  Obesity Long discussion about weight loss, diet, and exercise Recommended diet heavy in fruits and veggies and low in animal  meats, cheeses, and dairy products, appropriate calorie intake Patient will work on increasing activity, decrease saturated fats and simple carbs Follow up at next visit  Vitamin D Deficiency Continue Vit D supplementation to maintain value in therapeutic level of 60-100   Medication Management - TSH  Urine abnormality - Different odor to the urine - Routine UA with reflex microscopic - Urine Culture  Further disposition pending results if labs check today. Discussed med's effects and SE's.   Over 30 minutes of face to face interview, exam, counseling, chart review, and critical decision making was performed.   Future Appointments  Date Time Provider [place]/18/2023  4:00 PM CrArlana LindauRD [facility] None  08/30/2022  3:00 PM WiAlycia RossettiNP [facility] None   ______________________________________________________________  HPI [age].o. AA female  presents for overdue follow up for T2DM, htn, hld, weight.  History of TIA.  Had follow up with Dr. WiJannifer Franklinarlier in 2022.   She has been out of needles so has not used her insulin for the past 2 weeks.  Blood sugars have been running similar without using the insulin. She has been taking Metformin 2 tabs in AM and 1 tab in PM  She has a different odor to her urine for the past 1 month. Denies dysuria, hematuria, frequency.    BMI is Body mass index is 29.29 kg/m., she has been working on diet and exercise. She is walking 2-3 times a week and walks for 3-5 miles. She just got back from a cruise Wt Readings from Last 3 Encounters:  03/10/22 189 lb 12.8 oz (86.1 kg)  12/13/21  187 lb (84.8 kg)  11/30/21 193 lb 9.6 oz (87.8 kg)   Her blood pressure has not been controlled at home 130's/80's, today their BP is BP: 138/78  BP Readings from Last 3 Encounters:  03/10/22 138/78  11/30/21 134/82  08/29/21 114/68  She does workout, she has been walking. She denies chest pain, shortness of breath, dizziness.    She  is not on cholesterol medication (has been prescribed rosuvastatin 5 mg in the past and does report tolerated, but stopped, fear of SE) and denies myalgias. Her cholesterol is not at goal, LDL of 70. The cholesterol last visit was:   Lab Results  Component Value Date   CHOL 220 (H) 11/30/2021   HDL 72 11/30/2021   LDLCALC 131 (H) 11/30/2021   TRIG 80 11/30/2021   CHOLHDL 3.1 11/30/2021   She has been working on diet and exercise for diabetes   She has been taking metformin 500 mg 2 tabs with breakfast, 1 tab with dinner;  Has been working with nutritionist- has been doing much better with her diet Levemir 17 units in PM, has not used for 2 weeks since she had no needles Fasting: 135-160 in the last 2 months , Nighttime BS 130-160 Meter: Dexcom she is not on bASA - discussed and receptive to restart she is not on ACE/ARB Denies foot ulcerations, nausea, paresthesia of the feet and visual disturbances.   She has not had diabetes eye but will schedule and requested report be forwarded.  Last A1C in the office was:  Lab Results  Component Value Date   HGBA1C 8.7 (H) 11/30/2021   Has possible history of aldosteronism, follows with Dr. Darnell Level for this and DM, on spirolactone 63m BID and tolerates well.  Lab Results  Component Value Date   GFRAA 107 08/26/2020   Patient is on Vitamin D supplement, 1000 daily.    Current Medications:  Current Outpatient Medications on File Prior to Visit  Medication Sig Dispense Refill   aspirin 81 MG EC tablet Take 81 mg by mouth every morning.  0   Blood Glucose Monitoring Suppl (ONE TOUCH ULTRA MINI) w/Device KIT See admin instructions. for testing  0   Cyanocobalamin (B-12 PO) Take 1 tablet by mouth daily. Reported on 75/46/5681    FOLIC ACID PO Take by mouth.     glucose blood test strip Use as instructed 100 each 12   insulin detemir (LEVEMIR) 100 UNIT/ML FlexPen INJECT 14-20 UNITS DEPENDING ON MORNING SUGAR ONCE DAILY SUBCUTANEOUSLY. 15 mL 11    Insulin Pen Needle (BD PEN NEEDLE MICRO U/F) 32G X 6 MM MISC USE ONCE DAILY 100 each 2   Iron TABS Take 1 tablet by mouth daily. Reported on 09/08/2015     Magnesium 250 MG TABS Take by mouth.     metFORMIN (GLUCOPHAGE-XR) 500 MG 24 hr tablet TAKE 1 TABLET BY MOUTH TWICE A DAY WITH MEAL AS TOLERATED, FOR DIABETES (Patient taking differently: Takes two tablets in the morning and one tablet at night) 180 tablet PRN   rosuvastatin (CRESTOR) 5 MG tablet Take 1 tablet (5 mg total) by mouth daily. 30 tablet 11   spironolactone (ALDACTONE) 50 MG tablet Take 551min am and 5070mn pm 270 tablet 3   Vitamin D, Ergocalciferol, (DRISDOL) 1.25 MG (50000 UNIT) CAPS capsule Take 1 capsule (50,000 Units total) by mouth every 7 (seven) days. 12 capsule 7   No current facility-administered medications on file prior to visit.  Medical History:  Past Medical History:  Diagnosis Date   Adrenal adenoma    Allergy    Anemia    Diabetes mellitus without complication (HCC)    Endometriosis of rectovaginal septum    GERD (gastroesophageal reflux disease)    Headache    MIGRAINES   Hyperaldosteronism (HCC)    Possibly   Hyperlipidemia    Hypertension    Hypokalemia    Ovarian cyst    Raynaud disease    hands and feet    Allergies Allergies  Allergen Reactions   Ace Inhibitors Cough   Hctz [Hydrochlorothiazide] Cough    SURGICAL HISTORY She  has a past surgical history that includes Cesarean section; Cardiac catheterization; Tubal ligation; and Robotic assisted total hysterectomy with bilateral salpingo oophorectomy (Bilateral, 06/16/2014). FAMILY HISTORY Her family history includes Colon cancer (age of onset: 24) in her father; Diabetes in her father and mother; Hepatitis C in her father; Lupus in her maternal aunt and paternal aunt; Pancreatic cancer in her father; Scleroderma in her maternal grandmother; Stroke in her brother, maternal grandfather, and mother. SOCIAL HISTORY She  reports that she  has never smoked. She has never used smokeless tobacco. She reports current alcohol use. She reports that she does not use drugs.  Review of Systems  Constitutional:  Negative for malaise/fatigue and weight loss.  HENT:  Positive for tinnitus. Negative for hearing loss.   Eyes:  Negative for blurred vision and double vision.  Respiratory:  Negative for cough, shortness of breath and wheezing.   Cardiovascular:  Negative for chest pain, palpitations, orthopnea, claudication and leg swelling.  Gastrointestinal:  Negative for abdominal pain, blood in stool, constipation, diarrhea, heartburn, melena, nausea and vomiting.  Genitourinary: Negative.        Odor to urine  Musculoskeletal:  Negative for joint pain and myalgias.  Skin:  Negative for rash.  Neurological:  Positive for tingling (intermittent of feet). Negative for dizziness, sensory change, weakness and headaches.  Endo/Heme/Allergies:  Negative for polydipsia.  Psychiatric/Behavioral: Negative.    All other systems reviewed and are negative.     Physical Exam: Estimated body mass index is 29.29 kg/m as calculated from the following:   Height as of this encounter: 5' 7.5" (1.715 m).   Weight as of this encounter: 189 lb 12.8 oz (86.1 kg). BP 138/78   Temp 97.9 F (36.6 C)   Resp 17   Ht 5' 7.5" (1.715 m)   Wt 189 lb 12.8 oz (86.1 kg)   LMP 05/16/2014   BMI 29.29 kg/m  General Appearance: Well nourished, in no apparent distress. Eyes: PERRLA, EOMs, conjunctiva no swelling or erythema Sinuses: No Frontal/maxillary tenderness ENT/Mouth: Ext aud canals clear, normal light reflex with TMs without erythema, bulging.  Good dentition. No erythema, swelling, or exudate on post pharynx. Tonsils not swollen or erythematous. Hearing normal.  Neck: Supple, thyroid normal. No bruits Respiratory: Respiratory effort normal, BS equal bilaterally without rales, rhonchi, wheezing or stridor. Cardio: RRR without murmurs, rubs or gallops.  Brisk peripheral pulses without edema.  Abdomen: Soft, +BS. Non tender, no guarding, rebound, hernias, masses, or organomegaly. .  Lymphatics: Non tender without lymphadenopathy.  Musculoskeletal: Full ROM all peripheral extremities,5/5 strength, and normal gait. Skin: Warm, dry without rashes, lesions, ecchymosis.  Neuro: Cranial nerves intact, reflexes equal bilaterally. Normal muscle tone, no cerebellar symptoms.  Psych: Awake and oriented X 3, normal affect, Insight and Judgment appropriate.    Alycia Rossetti, NP 9:41 AM [facility]  Internal Medicine

## 2022-03-10 ENCOUNTER — Encounter: Payer: Self-pay | Admitting: Nurse Practitioner

## 2022-03-10 ENCOUNTER — Ambulatory Visit (INDEPENDENT_AMBULATORY_CARE_PROVIDER_SITE_OTHER): Payer: 59 | Admitting: Nurse Practitioner

## 2022-03-10 VITALS — BP 138/78 | Temp 97.9°F | Resp 17 | Ht 67.5 in | Wt 189.8 lb

## 2022-03-10 DIAGNOSIS — R8290 Unspecified abnormal findings in urine: Secondary | ICD-10-CM

## 2022-03-10 DIAGNOSIS — Z79899 Other long term (current) drug therapy: Secondary | ICD-10-CM | POA: Diagnosis not present

## 2022-03-10 DIAGNOSIS — E1169 Type 2 diabetes mellitus with other specified complication: Secondary | ICD-10-CM

## 2022-03-10 DIAGNOSIS — E119 Type 2 diabetes mellitus without complications: Secondary | ICD-10-CM

## 2022-03-10 DIAGNOSIS — E785 Hyperlipidemia, unspecified: Secondary | ICD-10-CM | POA: Diagnosis not present

## 2022-03-10 DIAGNOSIS — E0822 Diabetes mellitus due to underlying condition with diabetic chronic kidney disease: Secondary | ICD-10-CM | POA: Diagnosis not present

## 2022-03-10 DIAGNOSIS — E269 Hyperaldosteronism, unspecified: Secondary | ICD-10-CM

## 2022-03-10 DIAGNOSIS — Z6830 Body mass index (BMI) 30.0-30.9, adult: Secondary | ICD-10-CM | POA: Diagnosis not present

## 2022-03-10 DIAGNOSIS — E6609 Other obesity due to excess calories: Secondary | ICD-10-CM

## 2022-03-10 DIAGNOSIS — I10 Essential (primary) hypertension: Secondary | ICD-10-CM | POA: Diagnosis not present

## 2022-03-10 DIAGNOSIS — Z794 Long term (current) use of insulin: Secondary | ICD-10-CM

## 2022-03-10 DIAGNOSIS — N182 Chronic kidney disease, stage 2 (mild): Secondary | ICD-10-CM

## 2022-03-10 DIAGNOSIS — E559 Vitamin D deficiency, unspecified: Secondary | ICD-10-CM | POA: Diagnosis not present

## 2022-03-10 DIAGNOSIS — E1122 Type 2 diabetes mellitus with diabetic chronic kidney disease: Secondary | ICD-10-CM | POA: Diagnosis not present

## 2022-03-10 MED ORDER — BD PEN NEEDLE MICRO U/F 32G X 6 MM MISC: 2 refills | Status: DC

## 2022-03-13 LAB — URINE CULTURE
MICRO NUMBER:: 14024266
Result:: NO GROWTH
SPECIMEN QUALITY:: ADEQUATE

## 2022-03-13 LAB — COMPLETE METABOLIC PANEL WITH GFR
AG Ratio: 1.7 (calc) (ref 1.0–2.5)
ALT: 18 U/L (ref 6–29)
AST: 17 U/L (ref 10–35)
Albumin: 4.8 g/dL (ref 3.6–5.1)
Alkaline phosphatase (APISO): 80 U/L (ref 37–153)
BUN: 12 mg/dL (ref 7–25)
CO2: 27 mmol/L (ref 20–32)
Calcium: 10.3 mg/dL (ref 8.6–10.4)
Chloride: 103 mmol/L (ref 98–110)
Creat: 0.9 mg/dL (ref 0.50–1.03)
Globulin: 2.9 g/dL (calc) (ref 1.9–3.7)
Glucose, Bld: 175 mg/dL — ABNORMAL HIGH (ref 65–99)
Potassium: 4.7 mmol/L (ref 3.5–5.3)
Sodium: 139 mmol/L (ref 135–146)
Total Bilirubin: 0.7 mg/dL (ref 0.2–1.2)
Total Protein: 7.7 g/dL (ref 6.1–8.1)
eGFR: 75 mL/min/{1.73_m2} (ref >=60)

## 2022-03-13 LAB — CBC WITH DIFFERENTIAL/PLATELET
Absolute Monocytes: 399 cells/uL (ref 200–950)
Basophils Absolute: 40 cells/uL (ref 0–200)
Basophils Relative: 0.7 %
Eosinophils Absolute: 29 cells/uL (ref 15–500)
Eosinophils Relative: 0.5 %
HCT: 40.6 % (ref 35.0–45.0)
Hemoglobin: 13 g/dL (ref 11.7–15.5)
Lymphs Abs: 1830 cells/uL (ref 850–3900)
MCH: 27.4 pg (ref 27.0–33.0)
MCHC: 32 g/dL (ref 32.0–36.0)
MCV: 85.7 fL (ref 80.0–100.0)
MPV: 11.6 fL (ref 7.5–12.5)
Monocytes Relative: 7 %
Neutro Abs: 3403 cells/uL (ref 1500–7800)
Neutrophils Relative %: 59.7 %
Platelets: 215 10*3/uL (ref 140–400)
RBC: 4.74 10*6/uL (ref 3.80–5.10)
RDW: 12.8 % (ref 11.0–15.0)
Total Lymphocyte: 32.1 %
WBC: 5.7 10*3/uL (ref 3.8–10.8)

## 2022-03-13 LAB — URINALYSIS, ROUTINE W REFLEX MICROSCOPIC
Bilirubin Urine: NEGATIVE
Glucose, UA: NEGATIVE
Hgb urine dipstick: NEGATIVE
Ketones, ur: NEGATIVE
Leukocytes,Ua: NEGATIVE
Nitrite: NEGATIVE
Protein, ur: NEGATIVE
Specific Gravity, Urine: 1.019 (ref 1.001–1.035)
pH: 6 (ref 5.0–8.0)

## 2022-03-13 LAB — HEMOGLOBIN A1C
Hgb A1c MFr Bld: 8 % of total Hgb — ABNORMAL HIGH (ref <5.7)
Mean Plasma Glucose: 183 mg/dL
eAG (mmol/L): 10.1 mmol/L

## 2022-03-13 LAB — LIPID PANEL
Cholesterol: 240 mg/dL — ABNORMAL HIGH (ref <200)
HDL: 68 mg/dL (ref >=50)
LDL Cholesterol (Calc): 154 mg/dL (calc) — ABNORMAL HIGH
Non-HDL Cholesterol (Calc): 172 mg/dL (calc) — ABNORMAL HIGH (ref <130)
Total CHOL/HDL Ratio: 3.5 (calc) (ref <5.0)
Triglycerides: 80 mg/dL (ref <150)

## 2022-03-13 LAB — TSH: TSH: 2.68 mIU/L (ref 0.40–4.50)

## 2022-03-22 ENCOUNTER — Ambulatory Visit: Payer: Self-pay | Admitting: Nutrition

## 2022-05-08 ENCOUNTER — Telehealth: Payer: Self-pay | Admitting: Nurse Practitioner

## 2022-05-08 DIAGNOSIS — I10 Essential (primary) hypertension: Secondary | ICD-10-CM

## 2022-05-08 MED ORDER — SPIRONOLACTONE 50 MG PO TABS: ORAL_TABLET | ORAL | 3 refills | Status: DC

## 2022-05-08 MED ORDER — METFORMIN HCL ER 500 MG PO TB24: ORAL_TABLET | ORAL | 99 refills | Status: DC

## 2022-05-08 NOTE — Addendum Note (Signed)
Addended by: Chancy Hurter on: 05/08/2022 11:47 AM   Modules accepted: Orders

## 2022-05-08 NOTE — Telephone Encounter (Signed)
Patient is requesting refills on Spironlactone and Metformin to [facility] in [place].

## 2022-05-10 ENCOUNTER — Telehealth: Payer: Self-pay | Admitting: Nurse Practitioner

## 2022-05-10 DIAGNOSIS — I10 Essential (primary) hypertension: Secondary | ICD-10-CM

## 2022-05-10 DIAGNOSIS — E1169 Type 2 diabetes mellitus with other specified complication: Secondary | ICD-10-CM

## 2022-05-10 MED ORDER — SPIRONOLACTONE 50 MG PO TABS: ORAL_TABLET | ORAL | 3 refills | Status: DC

## 2022-05-10 MED ORDER — METFORMIN HCL ER 500 MG PO TB24: ORAL_TABLET | ORAL | 99 refills | Status: DC

## 2022-05-10 NOTE — Addendum Note (Signed)
Addended by: Chancy Hurter on: 05/10/2022 12:28 PM   Modules accepted: Orders

## 2022-05-10 NOTE — Telephone Encounter (Signed)
Patient is requesting a refill on her Vitamin D to [facility] in [place].

## 2022-05-11 ENCOUNTER — Other Ambulatory Visit: Payer: Self-pay | Admitting: Nurse Practitioner

## 2022-05-11 DIAGNOSIS — E559 Vitamin D deficiency, unspecified: Secondary | ICD-10-CM

## 2022-05-11 MED ORDER — VITAMIN D (ERGOCALCIFEROL) 1.25 MG (50000 UNIT) PO CAPS: 50000.0000 [IU] | ORAL_CAPSULE | ORAL | 7 refills | Status: AC

## 2022-06-01 IMAGING — CR DG CERVICAL SPINE COMPLETE 4+V
5 series · 5 of 5 positions shown · non-contrast
Comparison: None.

CLINICAL DATA: Left-sided cervicalgia with radicular symptoms

EXAM:
CERVICAL SPINE - COMPLETE 4+ VIEW

[w cervical spine lat]
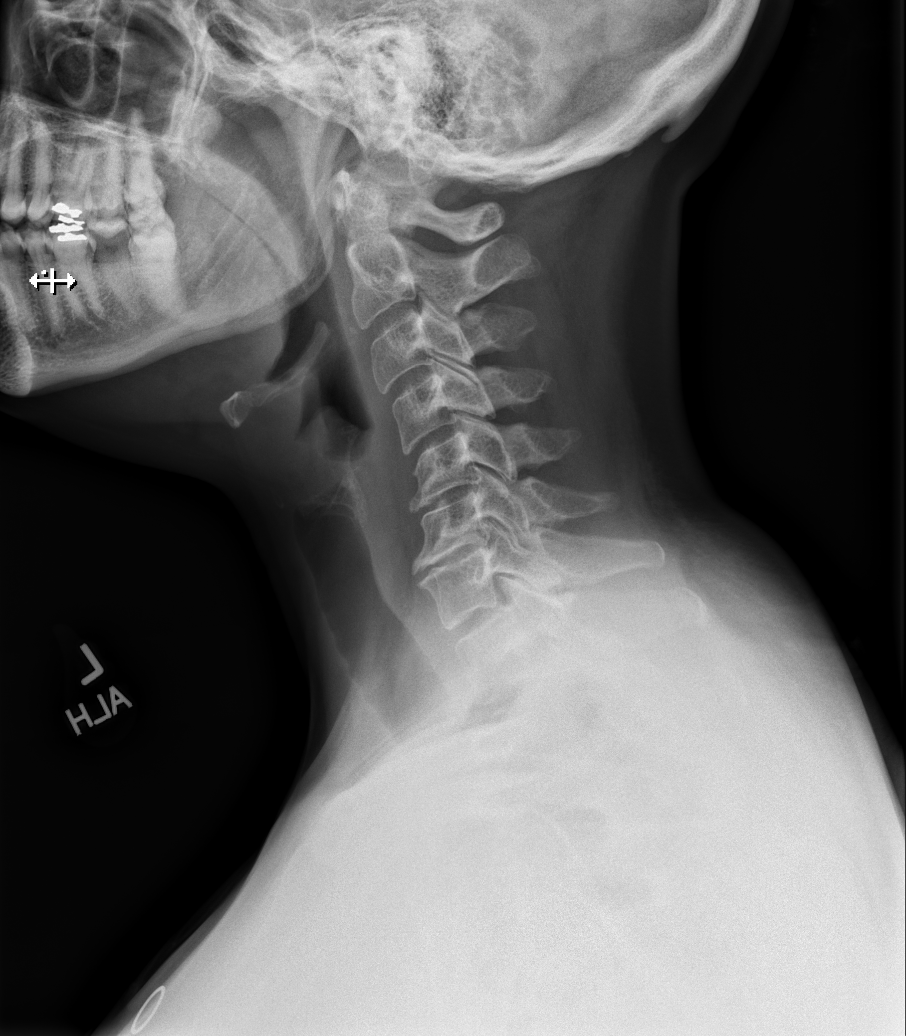

[w cervical spine ap_obl (1 of 2)]
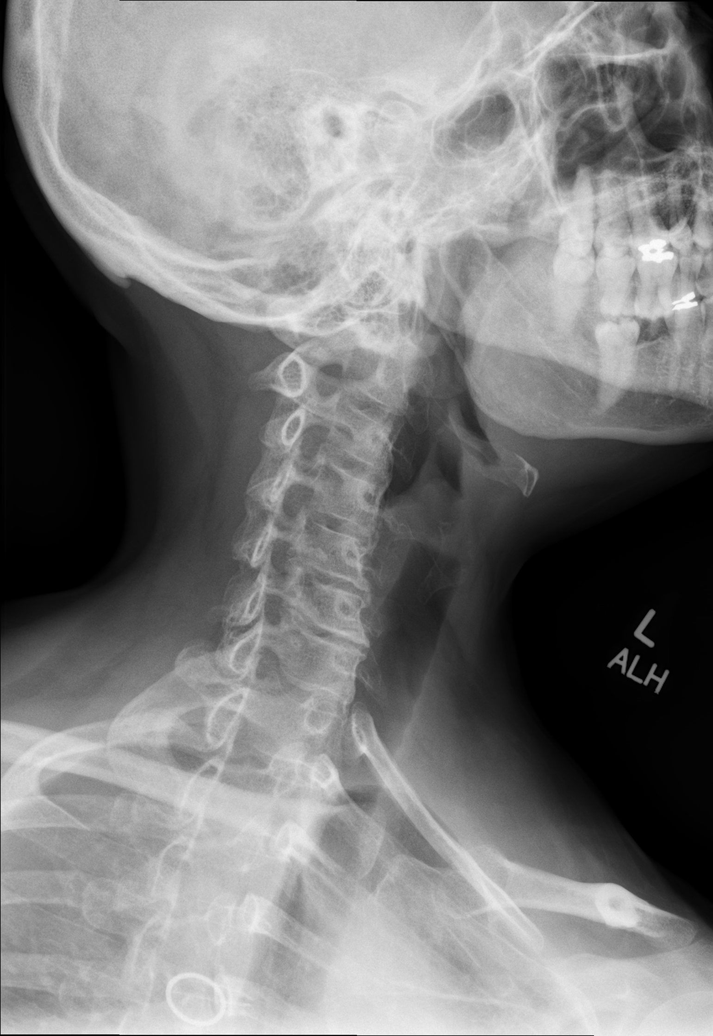

[w cervical spine ap_obl (2 of 2)]
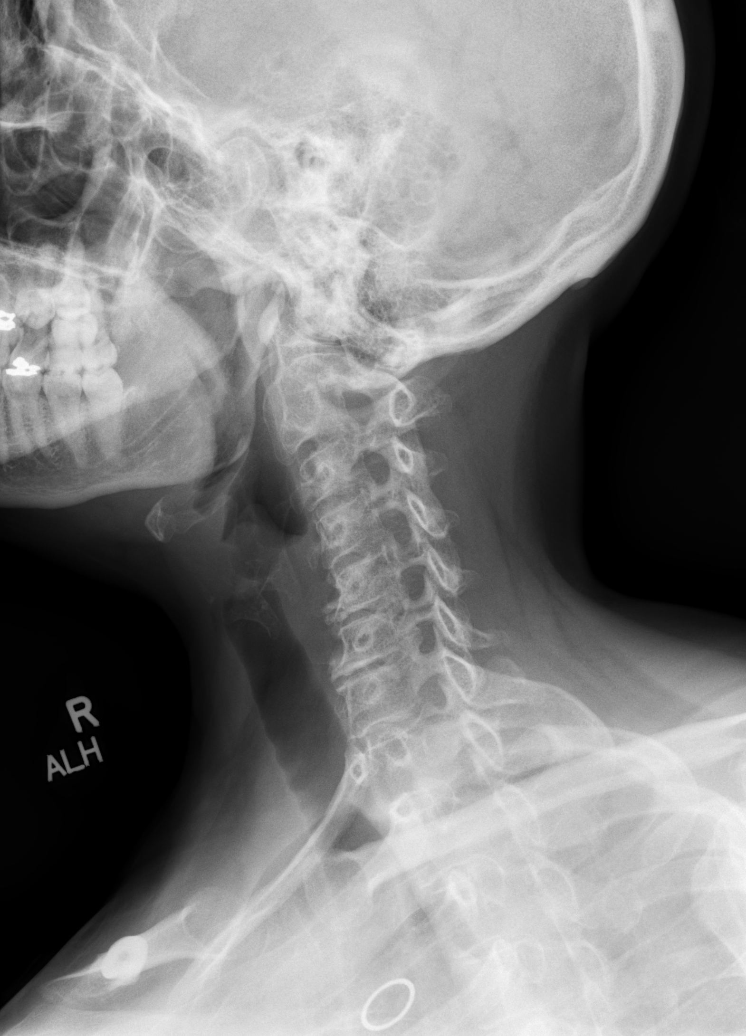

[w cervical spine ap]
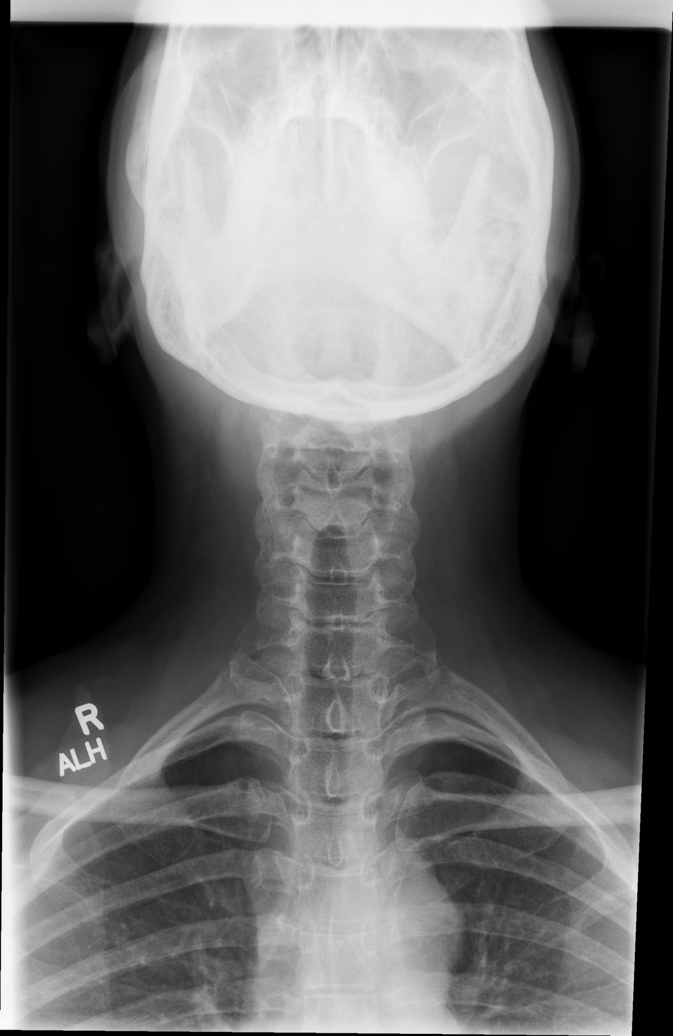

[w cervical spine odontoid]
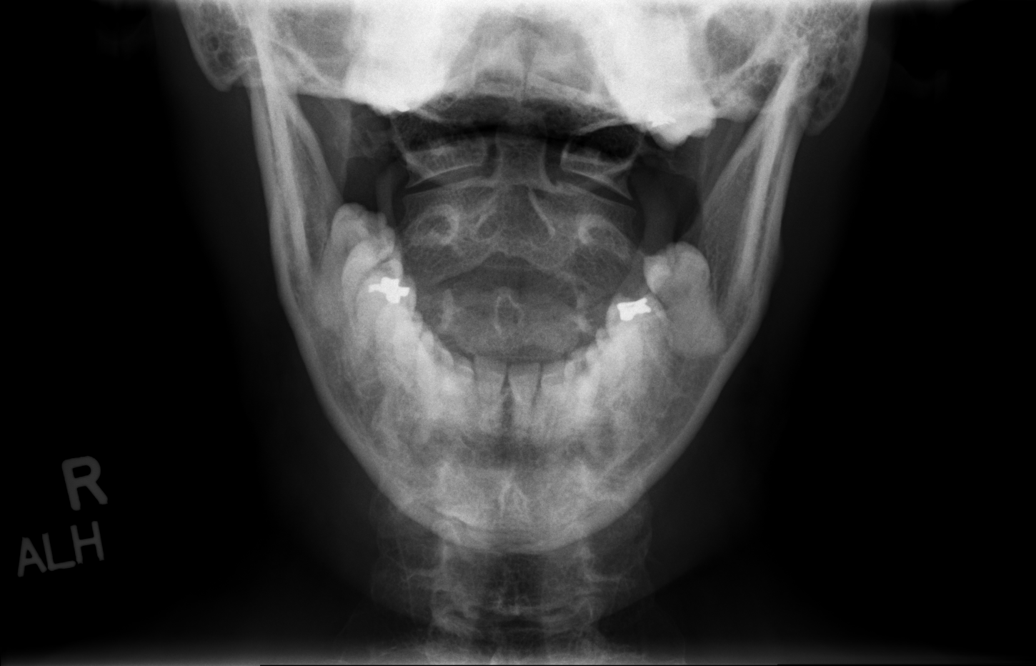

[5 of 5 positions shown; findings below may reference images not displayed]

FINDINGS: Frontal, lateral, open-mouth odontoid, and bilateral oblique views
were obtained. There is no fracture. There is 3 mm of retrolisthesis
of C5 on C6. There is 2 mm of anterolisthesis of C7 on T1. No other
spondylolisthesis. Prevertebral soft tissues and predental space
regions are normal. There is moderate disc space narrowing at C6-7
and to a slightly lesser degree at C5-6. Disc spaces at other levels
appear unremarkable. There is facet hypertrophy with exit foraminal
narrowing at C4-5, C5-6, and C6-7 bilaterally. There is reversal of
lordotic curvature. Lung apices are clear.
IMPRESSION: No fracture. Mild spondylolisthesis at C5-6 and C7-T1 is felt to be
due to underlying spondylosis. Osteoarthritic change at several
levels noted. Reversal of lordotic curvature may be indicative of a
degree of muscle spasm.

## 2022-06-16 ENCOUNTER — Ambulatory Visit: Payer: 59 | Admitting: Nurse Practitioner

## 2022-08-04 NOTE — Progress Notes (Unsigned)
Assessment and Plan:  There are no diagnoses linked to this encounter.    Further disposition pending results of labs. Discussed med's effects and SE's.   Over 30 minutes of exam, counseling, chart review, and critical decision making was performed.   Future Appointments  Date Time Provider [place]  08/07/2022  4:30 PM Alycia Rossetti, NP [facility] None  09/19/2022  9:00 AM Alycia Rossetti, NP [facility] None    ------------------------------------------------------------------------------------------------------------------   HPI LMP 05/16/2014  56 y.o.female presents for  Past Medical History:  Diagnosis Date   Adrenal adenoma    Allergy    Anemia    Diabetes mellitus without complication (HCC)    Endometriosis of rectovaginal septum    GERD (gastroesophageal reflux disease)    Headache    MIGRAINES   Hyperaldosteronism (HCC)    Possibly   Hyperlipidemia    Hypertension    Hypokalemia    Ovarian cyst    Raynaud disease    hands and feet      Allergies  Allergen Reactions   Ace Inhibitors Cough   Hctz [Hydrochlorothiazide] Cough    Current Outpatient Medications on File Prior to Visit  Medication Sig   aspirin 81 MG EC tablet Take 81 mg by mouth every morning.   Blood Glucose Monitoring Suppl (ONE TOUCH ULTRA MINI) w/Device KIT See admin instructions. for testing   Cyanocobalamin (B-12 PO) Take 1 tablet by mouth daily. Reported on [id number]   FOLIC ACID PO Take by mouth.   glucose blood test strip Use as instructed   insulin detemir (LEVEMIR) 100 UNIT/ML FlexPen INJECT 14-20 UNITS DEPENDING ON MORNING SUGAR ONCE DAILY SUBCUTANEOUSLY.   Insulin Pen Needle (BD PEN NEEDLE MICRO U/F) 32G X 6 MM MISC USE ONCE DAILY   Iron TABS Take 1 tablet by mouth daily. Reported on 09/08/2015   Magnesium 250 MG TABS Take by mouth.   metFORMIN (GLUCOPHAGE-XR) 500 MG 24 hr tablet TAKE 1 TABLET BY MOUTH TWICE A DAY WITH MEAL AS TOLERATED, FOR DIABETES   rosuvastatin  (CRESTOR) 5 MG tablet Take 1 tablet (5 mg total) by mouth daily.   spironolactone (ALDACTONE) 50 MG tablet Take '50mg'$  in am and '50mg'$  in pm   Vitamin D, Ergocalciferol, (DRISDOL) 1.25 MG (50000 UNIT) CAPS capsule Take 1 capsule (50,000 Units total) by mouth every 7 (seven) days.   No current facility-administered medications on file prior to visit.    ROS: all negative except above.   Physical Exam:  LMP 05/16/2014   General Appearance: Well nourished, in no apparent distress. Eyes: PERRLA, EOMs, conjunctiva no swelling or erythema Sinuses: No Frontal/maxillary tenderness ENT/Mouth: Ext aud canals clear, TMs without erythema, bulging. No erythema, swelling, or exudate on post pharynx.  Tonsils not swollen or erythematous. Hearing normal.  Neck: Supple, thyroid normal.  Respiratory: Respiratory effort normal, BS equal bilaterally without rales, rhonchi, wheezing or stridor.  Cardio: RRR with no MRGs. Brisk peripheral pulses without edema.  Abdomen: Soft, + BS.  Non tender, no guarding, rebound, hernias, masses. Lymphatics: Non tender without lymphadenopathy.  Musculoskeletal: Full ROM, 5/5 strength, normal gait.  Skin: Warm, dry without rashes, lesions, ecchymosis.  Neuro: Cranial nerves intact. Normal muscle tone, no cerebellar symptoms. Sensation intact.  Psych: Awake and oriented X 3, normal affect, Insight and Judgment appropriate.     Alycia Rossetti, NP 8:57 AM [facility]

## 2022-08-07 ENCOUNTER — Ambulatory Visit (INDEPENDENT_AMBULATORY_CARE_PROVIDER_SITE_OTHER): Payer: 59 | Admitting: Nurse Practitioner

## 2022-08-07 ENCOUNTER — Encounter: Payer: Self-pay | Admitting: Nurse Practitioner

## 2022-08-07 VITALS — BP 140/82 | HR 63 | Temp 97.7°F | Resp 16 | Ht 67.5 in | Wt 194.4 lb

## 2022-08-07 DIAGNOSIS — Z794 Long term (current) use of insulin: Secondary | ICD-10-CM | POA: Diagnosis not present

## 2022-08-07 DIAGNOSIS — E6609 Other obesity due to excess calories: Secondary | ICD-10-CM | POA: Diagnosis not present

## 2022-08-07 DIAGNOSIS — Z6830 Body mass index (BMI) 30.0-30.9, adult: Secondary | ICD-10-CM

## 2022-08-07 DIAGNOSIS — E119 Type 2 diabetes mellitus without complications: Secondary | ICD-10-CM | POA: Diagnosis not present

## 2022-08-07 DIAGNOSIS — Z0184 Encounter for antibody response examination: Secondary | ICD-10-CM

## 2022-08-09 LAB — QUANTIFERON-TB GOLD PLUS
Mitogen-NIL: 8.93 IU/mL
NIL: 0.03 IU/mL
QuantiFERON-TB Gold Plus: NEGATIVE
TB1-NIL: 0.04 IU/mL
TB2-NIL: 0.02 IU/mL

## 2022-08-09 LAB — MEASLES/MUMPS/RUBELLA IMMUNITY
Mumps IgG: >300 AU/mL
Rubella: 4.69 Index
Rubeola IgG: 21 AU/mL

## 2022-08-09 LAB — VARICELLA ZOSTER ANTIBODY, IGG: Varicella IgG: 2720 index

## 2022-08-30 ENCOUNTER — Encounter: Payer: 59 | Admitting: Nurse Practitioner

## 2022-09-03 ENCOUNTER — Encounter: Payer: Self-pay | Admitting: Nurse Practitioner

## 2022-09-18 NOTE — Progress Notes (Unsigned)
COMPLETE PHYSICAL  Assessment and Plan:  Encounter for general adult medical examination with abnormal findings [name]  Essential hypertension Continue current medications: previously prescribed amlodipine 2.5mg  did not start, Monitor blood pressure at home; call if consistently over 130/80 Continue DASH diet.   Reminder to go to the ER if any CP, SOB, nausea, dizziness, severe HA, changes vision/speech, left arm numbness and tingling and jaw pain.   Transient cerebral ischemia, unspecified type Control blood pressure, cholesterol, glucose, increase exercise.   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HCC) -     CMP  Hyperlipidemia, unspecified hyperlipidemia type Prescribed rosuvastatin , not taking this, discussed at length with patient Discussed dietary and exercise modifications Low fat diet -     Lipid panel - goal is less than 70  Hypokalemia -     CMP -     Magnesium  Anemia, unspecified type - monitor, continue iron supp with Vitamin C and increase green leafy veggies  Allergic state, subsequent encounter Taking OTC  Endometriosis of rectovaginal septum History of, asymptomatic today  Vitamin D deficiency Continue supplementation to maintain goal of 70-100 Taking Vitamin D 50,000 weekly IU daily -Vitamin D  Type 2 diabetes mellitus with hyperlipidemia (HCC) Taking Metformin , 2 tabs in am, 1 tab in PM Discussed general issues about diabetes pathophysiology and management.,  Suggested low cholesterol diet.,  Encouraged aerobic exercise.,  Discussed foot care.,  Reminded to get yearly retinal exam. Referral made to get diabetes nutrition counseling  -     Hemoglobin A1c  Diabetes mellitus type 2, insulin dependent (HCC) Taking Metformin , 2 tabs in am, 1 tab in PM Discussed general issues about diabetes pathophysiology and management.,  Suggested low cholesterol diet.,  Encouraged aerobic exercise.,  Discussed foot care.,  Reminded to get yearly retinal  exam. Referral made to get diabetes nutrition counseling -     Hemoglobin A1c  Obesity - BMI 30 Long discussion about weight loss, diet, and exercise Recommended diet heavy in fruits and veggies and low in animal meats, cheeses, and dairy products, appropriate calorie intake Patient will work on limiting simple carbs, increasing fiber, limiting red meat, increase fresh fruits and vegetables  Follow up at next visit   Screening for thyroid disorder -TSH  Screening for AAA - U/S ABD Retroperitoneal LTD  Screening for ischemic heart disease - EKG  Screening for blood or protein in urine - Routine UA with reflex microscopic - Microalbumin/creatinine urine ratio   Further disposition pending results if labs check today. Discussed med's effects and SE's.   Over 30 minutes of face to face interview, exam, counseling, chart review, and critical decision making was performed.    Discussed med's effects and SE's. Screening labs and tests as requested with regular follow-up as recommended. ______________________________________________________________  HPI 57 y.o. female  presents for a complete physical.    She also has been having mirgraine headaches.  She was taking tyleenol  which did dull the pain.  She is going to PT for headaches, has had one session and has improved and plans to continue this.   History of TIA.  Discussed importance of controlling glucose, cholesterol and blood pressure as this increases risks of another event.   Her blood pressure has been controlled at home, today their BP is   . She never started Amlodipine.  BP Readings from Last 3 Encounters:  08/07/22 (!) 140/82  03/10/22 138/78  11/30/21 134/82    She does workout, she has been walking. She denies chest pain, shortness  of breath, dizziness.   BMI is There is no height or weight on file to calculate BMI., she is working on diet and exercise. She does difficulty staying consistent preparing  healthy meals.  Has been grabbing fast food  She has started [employer]. She is interested in talking with a nutritionist. She is walking 2-3 times a week and walks for 3-5 miles.  Wt Readings from Last 3 Encounters:  08/07/22 194 lb 6.4 oz (88.2 kg)  03/10/22 189 lb 12.8 oz (86.1 kg)  12/13/21 187 lb (84.8 kg)   She is not on cholesterol medication and denies myalgias. Her cholesterol is not at goal, LDL of 70. The cholesterol last visit was:   Lab Results  Component Value Date   CHOL 240 (H) 03/10/2022   HDL 68 03/10/2022   LDLCALC 154 (H) 03/10/2022   TRIG 80 03/10/2022   CHOLHDL 3.5 03/10/2022   She has been working on diet and exercise for diabetes   She is concerned about food. She hasn't started on the Trulicity because she had previous bad joint pain with Ozempic.  Levemir 15-20 units 11am-1pm.   BID. Lowest fasting 135- 200's In the evening prior to be 140's-160's She use glucometer she is on bASA she is not on ACE/ARB Denies foot ulcerations, nausea, paresthesia of the feet and visual disturbances.   Last A1C in the office was:  Lab Results  Component Value Date   HGBA1C 8.0 (H) 03/10/2022  Has possible history of aldosteronism, follows with Dr. Reece Agar for this and DM, on spirolactone  BID and tolerates well.  Lab Results  Component Value Date   GFRAA 107 08/26/2020   Patient is on Vitamin D supplement, 1000 daily. .    Current Medications:  Current Outpatient Medications on File Prior to Visit  Medication Sig Dispense Refill   ascorbic acid (VITAMIN C) 250 MG tablet Take by mouth.     aspirin 81 MG EC tablet Take 81 mg by mouth every morning.  0   Blood Glucose Monitoring Suppl (ONE TOUCH ULTRA MINI) w/Device KIT See admin instructions. for testing  0   Cyanocobalamin (B-12 PO) Take 1 tablet by mouth daily. Reported on 12/14/2015     FOLIC ACID PO Take by mouth.     glucose blood test strip Use as instructed 100 each 12   insulin detemir (LEVEMIR) 100  UNIT/ML FlexPen INJECT 14-20 UNITS DEPENDING ON MORNING SUGAR ONCE DAILY SUBCUTANEOUSLY. 15 mL 11   Insulin Pen Needle (BD PEN NEEDLE MICRO U/F) 32G X 6 MM MISC USE ONCE DAILY 100 each 2   Iron TABS Take 1 tablet by mouth daily. Reported on 09/08/2015     Magnesium 250 MG TABS Take by mouth.     metFORMIN (GLUCOPHAGE-XR) 500 MG 24 hr tablet TAKE 1 TABLET BY MOUTH TWICE A DAY WITH MEAL AS TOLERATED, FOR DIABETES 180 tablet PRN   spironolactone (ALDACTONE) 50 MG tablet Take  in am and  in pm 270 tablet 3   Vitamin D, Ergocalciferol, (DRISDOL) 1.25 MG (50000 UNIT) CAPS capsule Take 1 capsule (50,000 Units total) by mouth every 7 (seven) days. 12 capsule 7   No current facility-administered medications on file prior to visit.   Health Maintenance:   Immunization History  Administered Date(s) Administered   Influenza-Unspecified 07/29/2022   Moderna Sars-Covid-2 Vaccination 06/11/2020, 07/09/2020   Td 06/06/2007, 07/29/2022   Tetanus: 2009 Pneumovax: N/A Flu vaccine: Declines Zostavax: N/A  Pap: Today 08/29/21 MGM: 02/2020 DEXA: N/A  Colonoscopy:  09/2019, adenomatous Q3years Dr Adela Lank 2024 due EGD: N/A  Ct HEAD 2017 MRI BRAIN 2016    Medical History:  Past Medical History:  Diagnosis Date   Adrenal adenoma    Allergy    Anemia    Diabetes mellitus without complication (HCC)    Endometriosis of rectovaginal septum    GERD (gastroesophageal reflux disease)    Headache    MIGRAINES   Hyperaldosteronism (HCC)    Possibly   Hyperlipidemia    Hypertension    Hypokalemia    Ovarian cyst    Raynaud disease    hands and feet    Allergies Allergies  Allergen Reactions   Ace Inhibitors Cough   Hctz [Hydrochlorothiazide] Cough    SURGICAL HISTORY She  has a past surgical history that includes Cesarean section; Cardiac catheterization; Tubal ligation; and Robotic assisted total hysterectomy with bilateral salpingo oophorectomy (Bilateral, 06/16/2014). FAMILY  HISTORY Her family history includes Colon cancer (age of onset: 41) in her father; Diabetes in her father and mother; Hepatitis C in her father; Lupus in her maternal aunt and paternal aunt; Pancreatic cancer in her father; Scleroderma in her maternal grandmother; Stroke in her brother, maternal grandfather, and mother. SOCIAL HISTORY She  reports that she has never smoked. She has never used smokeless tobacco. She reports current alcohol use. She reports that she does not use drugs.  Review of Systems  Constitutional:  Negative for chills and fever.  HENT:  Negative for congestion, hearing loss, sinus pain, sore throat and tinnitus.   Eyes:  Negative for blurred vision and double vision.  Respiratory:  Negative for cough, hemoptysis, sputum production, shortness of breath and wheezing.   Cardiovascular:  Negative for chest pain, palpitations and leg swelling.  Gastrointestinal:  Negative for abdominal pain, constipation, diarrhea, heartburn, nausea and vomiting.  Genitourinary:  Negative for dysuria and urgency.  Musculoskeletal:  Negative for back pain, falls, joint pain, myalgias and neck pain.  Skin:  Negative for rash.  Neurological:  Negative for dizziness, tingling, tremors, weakness and headaches.  Endo/Heme/Allergies:  Does not bruise/bleed easily.  Psychiatric/Behavioral:  Negative for depression and suicidal ideas. The patient is not nervous/anxious and does not have insomnia.       Physical Exam: Estimated body mass index is 30 kg/m as calculated from the following:   Height as of 08/07/22: 5' 7.5" (1.715 m).   Weight as of 08/07/22: 194 lb 6.4 oz (88.2 kg). LMP 05/16/2014  General Appearance: Well nourished, in no apparent distress. Eyes: PERRLA, EOMs, conjunctiva no swelling or erythema, normal fundi and vessels. Sinuses: No Frontal/maxillary tenderness ENT/Mouth: Ext aud canals clear, normal light reflex with TMs without erythema, bulging.  Good dentition. No erythema,  swelling, or exudate on post pharynx. Tonsils not swollen or erythematous. Hearing normal.  Neck: Supple, thyroid normal. No bruits Respiratory: Respiratory effort normal, BS equal bilaterally without rales, rhonchi, wheezing or stridor. Cardio: RRR without murmurs, rubs or gallops. Brisk peripheral pulses without edema.  Chest: symmetric, with normal excursions and percussion. Breasts: breasts appear normal, no suspicious masses, no skin or nipple changes or axillary nodes.  Abdomen: Soft, +BS. Non tender, no guarding, rebound, hernias, masses, or organomegaly. .  Lymphatics: Non tender without lymphadenopathy.  Pelvic exam: normal external genitalia, vulva, vagina, cervix surgically absent , vaginal Pap taken and submitted.  No masses or tenderness  Musculoskeletal: Full ROM all peripheral extremities,5/5 strength, and normal gait. Skin: Warm, dry without rashes, lesions, ecchymosis.  Neuro: Cranial nerves intact,  reflexes equal bilaterally. Normal muscle tone, no cerebellar symptoms. Sensation intact.  Psych: Awake and oriented X 3, normal affect, Insight and Judgment appropriate.    EKG: NSR, no ST changes   [facility]  09/18/2022

## 2022-09-19 ENCOUNTER — Other Ambulatory Visit: Payer: Self-pay | Admitting: Nurse Practitioner

## 2022-09-19 ENCOUNTER — Telehealth: Payer: Self-pay | Admitting: Nurse Practitioner

## 2022-09-19 ENCOUNTER — Ambulatory Visit (INDEPENDENT_AMBULATORY_CARE_PROVIDER_SITE_OTHER): Payer: 59 | Admitting: Nurse Practitioner

## 2022-09-19 ENCOUNTER — Encounter: Payer: Self-pay | Admitting: Nurse Practitioner

## 2022-09-19 VITALS — BP 130/82 | HR 76 | Temp 97.7°F | Ht 67.0 in | Wt 190.6 lb

## 2022-09-19 DIAGNOSIS — Z136 Encounter for screening for cardiovascular disorders: Secondary | ICD-10-CM

## 2022-09-19 DIAGNOSIS — T7840XD Allergy, unspecified, subsequent encounter: Secondary | ICD-10-CM

## 2022-09-19 DIAGNOSIS — I10 Essential (primary) hypertension: Secondary | ICD-10-CM

## 2022-09-19 DIAGNOSIS — Z794 Long term (current) use of insulin: Secondary | ICD-10-CM | POA: Diagnosis not present

## 2022-09-19 DIAGNOSIS — E785 Hyperlipidemia, unspecified: Secondary | ICD-10-CM | POA: Diagnosis not present

## 2022-09-19 DIAGNOSIS — E559 Vitamin D deficiency, unspecified: Secondary | ICD-10-CM

## 2022-09-19 DIAGNOSIS — G459 Transient cerebral ischemic attack, unspecified: Secondary | ICD-10-CM

## 2022-09-19 DIAGNOSIS — I700 Atherosclerosis of aorta: Secondary | ICD-10-CM | POA: Diagnosis not present

## 2022-09-19 DIAGNOSIS — E1122 Type 2 diabetes mellitus with diabetic chronic kidney disease: Secondary | ICD-10-CM | POA: Diagnosis not present

## 2022-09-19 DIAGNOSIS — Z1329 Encounter for screening for other suspected endocrine disorder: Secondary | ICD-10-CM

## 2022-09-19 DIAGNOSIS — E0822 Diabetes mellitus due to underlying condition with diabetic chronic kidney disease: Secondary | ICD-10-CM | POA: Diagnosis not present

## 2022-09-19 DIAGNOSIS — E269 Hyperaldosteronism, unspecified: Secondary | ICD-10-CM

## 2022-09-19 DIAGNOSIS — E1169 Type 2 diabetes mellitus with other specified complication: Secondary | ICD-10-CM | POA: Diagnosis not present

## 2022-09-19 DIAGNOSIS — E1140 Type 2 diabetes mellitus with diabetic neuropathy, unspecified: Secondary | ICD-10-CM

## 2022-09-19 DIAGNOSIS — E876 Hypokalemia: Secondary | ICD-10-CM

## 2022-09-19 DIAGNOSIS — Z0001 Encounter for general adult medical examination with abnormal findings: Secondary | ICD-10-CM

## 2022-09-19 DIAGNOSIS — Z0000 Encounter for general adult medical examination without abnormal findings: Secondary | ICD-10-CM

## 2022-09-19 DIAGNOSIS — K209 Esophagitis, unspecified without bleeding: Secondary | ICD-10-CM

## 2022-09-19 DIAGNOSIS — E6609 Other obesity due to excess calories: Secondary | ICD-10-CM

## 2022-09-19 DIAGNOSIS — Z91148 Patient's other noncompliance with medication regimen for other reason: Secondary | ICD-10-CM

## 2022-09-19 DIAGNOSIS — Z1389 Encounter for screening for other disorder: Secondary | ICD-10-CM

## 2022-09-19 DIAGNOSIS — N182 Chronic kidney disease, stage 2 (mild): Secondary | ICD-10-CM

## 2022-09-19 DIAGNOSIS — Z79899 Other long term (current) drug therapy: Secondary | ICD-10-CM

## 2022-09-19 LAB — TSH: TSH: 1.89 mIU/L (ref 0.40–4.50)

## 2022-09-19 LAB — VITAMIN D 25 HYDROXY (VIT D DEFICIENCY, FRACTURES): Vit D, 25-Hydroxy: 84 ng/mL (ref 30–100)

## 2022-09-19 MED ORDER — ESOMEPRAZOLE MAGNESIUM 40 MG PO CPDR
40.0000 mg | DELAYED_RELEASE_CAPSULE | Freq: Two times a day (BID) | ORAL | 0 refills | Status: DC
Start: 2022-09-19 — End: 2022-12-15

## 2022-09-19 MED ORDER — SUCRALFATE 1 G PO TABS
1.0000 g | ORAL_TABLET | Freq: Four times a day (QID) | ORAL | 0 refills | Status: DC
Start: 2022-09-19 — End: 2022-12-15

## 2022-09-19 NOTE — Patient Instructions (Signed)
Esophagitis  Esophagitis is inflammation of the esophagus. The esophagus is the tube that carries food from the mouth to the stomach. Esophagitis can cause soreness or pain in the esophagus. This condition can make it difficult and painful to swallow. What are the causes? Most causes of esophagitis are not serious. Common causes of this condition include: Gastroesophageal reflux disease (GERD). This is when stomach contents move back up into the esophagus (reflux). Repeated vomiting. An allergic reaction, especially caused by food allergies (eosinophilic esophagitis). Injury to the esophagus by swallowing large pills with or without water, or swallowing certain types of medicines. Swallowing harmful chemicals, such as household cleaning products. Drinking a lot of alcohol. An infection of the esophagus. This most often occurs in people who have a weakened immune system. Radiation or chemotherapy treatment for cancer. Certain diseases such as sarcoidosis, Crohn's disease, and scleroderma. What are the signs or symptoms? Symptoms of this condition include: Difficult or painful swallowing. Pain with swallowing acidic liquids, such as citrus juices. You may also have pain when you burp. Chest pain and difficulty breathing. Nausea and vomiting. Pain in the abdomen. Weight loss. Ulcers in the mouth and white patches in the mouth (candidiasis). Fever. Coughing up blood or vomiting blood. Stool that is black, tarry, or bright red. How is this diagnosed? This condition may be diagnosed based on your medical history and a physical exam. You may also have other tests, including: A test to examine your esophagus and stomach with a small flexible tube with a camera (endoscopy). A test that measures the acidity level in your esophagus. A test that measures how much pressure is on your esophagus. A barium swallow or modified barium swallow to show the shape, size, and functioning of your  esophagus. Allergy tests. How is this treated? Treatment for this condition depends on the cause of your esophagitis. In some cases, steroids or other medicines may be given to help relieve your symptoms or to treat the underlying cause of your condition. You may have to make some lifestyle changes, such as: Avoiding alcohol. Quitting any products that contain nicotine or tobacco. These products include cigarettes, chewing tobacco, and vaping devices, such as e-cigarettes. If you need help quitting, ask your health care provider. Changing your diet. Exercising. Changing your sleep habits and your sleep environment. Follow these instructions at home: Medicines Take over-the-counter and prescription medicines only as told by your health care provider. Do not take aspirin, ibuprofen, or other NSAIDs unless your health care provider told you to do so. If you have trouble taking pills: Use a pill splitter to decrease the size of the pill. This will decrease the chance of the pill getting stuck or injuring your esophagus. Drink water after you take a pill. Eating and drinking  Avoid foods and drinks that seem to make your symptoms worse. Follow a diet as recommended by your health care provider. This may involve avoiding foods and drinks such as: Coffee and tea, with or without caffeine. Drinks that contain alcohol. Energy drinks and sports drinks. Carbonated drinks or sodas. Chocolate and cocoa. Peppermint and mint flavorings. Garlic and onions. Horseradish. Spicy and acidic foods, including peppers, chili powder, curry powder, vinegar, hot sauces, and barbecue sauce. Citrus fruit juices and citrus fruits, such as oranges, lemons, and limes. Tomato-based foods, such as red sauce, chili, salsa, and pizza with red sauce. Fried and fatty foods, such as donuts, french fries, potato chips, and high-fat dressings. High-fat meats, such as hot dogs and fatty   cuts of red and white meats, such as  rib eye steak, sausage, ham, and bacon. High-fat dairy items, such as whole milk, butter, and cream cheese. Lifestyle Eat small, frequent meals instead of large meals. Avoid drinking large amounts of liquid with your meals. Avoid eating meals during the 2-3 hours before bedtime. Avoid lying down right after you eat. Do not exercise right after you eat. Do not use any products that contain nicotine or tobacco. These products include cigarettes, chewing tobacco, and vaping devices, such as e-cigarettes. If you need help quitting, ask your health care provider. General instructions  Pay attention to any changes in your symptoms. Let your health care provider know about them. Wear loose-fitting clothing. Do not wear anything tight around your waist that causes pressure on your abdomen. Raise (elevate) the head of your bed about 6 inches (15 cm). You may need to use a wedge to do this. Try relaxation strategies such as yoga, deep breathing, or meditation to manage stress. If you need help reducing stress, ask your health care provider. If you are overweight, reduce your weight to an amount that is healthy for you. Ask your health care provider for guidance about a safe weight loss goal. Keep all follow-up visits. This is important. Contact a health care provider if: You have new symptoms. You have unexplained weight loss. You have difficulty swallowing, or it hurts to swallow. You have wheezing or a cough that does not go away. Your symptoms do not improve with treatment. You have frequent heartburn for more than two weeks. Get help right away if: You have sudden severe pain in your arms, neck, jaw, teeth, or back. You suddenly feel sweaty, dizzy, or light-headed. You have chest pain or shortness of breath. You vomit and the vomit is green, yellow, or black, or it looks like blood or coffee grounds. Your stool is red, bloody, or black. You have a fever. You cannot swallow, drink, or  eat. These symptoms may represent a serious problem that is an emergency. Do not wait to see if the symptoms will go away. Get medical help right away. Call your local emergency services (911 in the [place] Do not drive yourself to the hospital. Summary Esophagitis is inflammation of the esophagus. Most causes of esophagitis are not serious. Follow your health care provider's instructions about eating and drinking. Contact a health care provider if you have new symptoms, have weight loss, or coughing that does not stop. Get help right away if you have severe pain in the arms, neck, jaw, teeth, or back, or if you have chest pain, shortness of breath, or fever. This information is not intended to replace advice given to you by your health care provider. Make sure you discuss any questions you have with your health care provider. Document Revised: 12/01/2019 Document Reviewed: 12/01/2019 Elsevier Patient Education  2023 [employer]  

## 2022-09-19 NOTE — Telephone Encounter (Signed)
I sent the referral in

## 2022-09-19 NOTE — Telephone Encounter (Signed)
Patient would like a new referral to her dietician. She last saw Norm Salt but it's been awhile and they told her she needed a new ref. From [place]

## 2022-09-20 LAB — CBC WITH DIFFERENTIAL/PLATELET
Absolute Monocytes: 306 cells/uL (ref 200–950)
Basophils Absolute: 41 cells/uL (ref 0–200)
Basophils Relative: 0.8 %
Eosinophils Absolute: 92 cells/uL (ref 15–500)
Eosinophils Relative: 1.8 %
HCT: 40.3 % (ref 35.0–45.0)
Hemoglobin: 13.1 g/dL (ref 11.7–15.5)
Lymphs Abs: 1556 cells/uL (ref 850–3900)
MCH: 27.2 pg (ref 27.0–33.0)
MCHC: 32.5 g/dL (ref 32.0–36.0)
MCV: 83.8 fL (ref 80.0–100.0)
MPV: 11.7 fL (ref 7.5–12.5)
Monocytes Relative: 6 %
Neutro Abs: 3106 cells/uL (ref 1500–7800)
Neutrophils Relative %: 60.9 %
Platelets: 207 10*3/uL (ref 140–400)
RBC: 4.81 10*6/uL (ref 3.80–5.10)
RDW: 12.9 % (ref 11.0–15.0)
Total Lymphocyte: 30.5 %
WBC: 5.1 10*3/uL (ref 3.8–10.8)

## 2022-09-20 LAB — COMPLETE METABOLIC PANEL WITH GFR
AG Ratio: 1.6 (calc) (ref 1.0–2.5)
ALT: 19 U/L (ref 6–29)
AST: 17 U/L (ref 10–35)
Albumin: 4.6 g/dL (ref 3.6–5.1)
Alkaline phosphatase (APISO): 77 U/L (ref 37–153)
BUN: 13 mg/dL (ref 7–25)
CO2: 26 mmol/L (ref 20–32)
Calcium: 9.8 mg/dL (ref 8.6–10.4)
Chloride: 103 mmol/L (ref 98–110)
Creat: 0.76 mg/dL (ref 0.50–1.03)
Globulin: 2.8 g/dL (calc) (ref 1.9–3.7)
Glucose, Bld: 199 mg/dL — ABNORMAL HIGH (ref 65–99)
Potassium: 4.3 mmol/L (ref 3.5–5.3)
Sodium: 137 mmol/L (ref 135–146)
Total Bilirubin: 0.7 mg/dL (ref 0.2–1.2)
Total Protein: 7.4 g/dL (ref 6.1–8.1)
eGFR: 91 mL/min/{1.73_m2} (ref >=60)

## 2022-09-20 LAB — URINALYSIS, ROUTINE W REFLEX MICROSCOPIC
Bilirubin Urine: NEGATIVE
Glucose, UA: NEGATIVE
Hgb urine dipstick: NEGATIVE
Leukocytes,Ua: NEGATIVE
Nitrite: NEGATIVE
Protein, ur: NEGATIVE
Specific Gravity, Urine: 1.017 (ref 1.001–1.035)
pH: 6.5 (ref 5.0–8.0)

## 2022-09-20 LAB — MICROALBUMIN / CREATININE URINE RATIO
Creatinine, Urine: 115 mg/dL (ref 20–275)
Microalb, Ur: <0.2 mg/dL

## 2022-09-20 LAB — HEMOGLOBIN A1C
Hgb A1c MFr Bld: 8.7 % of total Hgb — ABNORMAL HIGH (ref <5.7)
Mean Plasma Glucose: 203 mg/dL
eAG (mmol/L): 11.2 mmol/L

## 2022-09-20 LAB — LIPID PANEL
Cholesterol: 235 mg/dL — ABNORMAL HIGH (ref <200)
HDL: 65 mg/dL (ref >=50)
LDL Cholesterol (Calc): 152 mg/dL (calc) — ABNORMAL HIGH
Non-HDL Cholesterol (Calc): 170 mg/dL (calc) — ABNORMAL HIGH (ref <130)
Total CHOL/HDL Ratio: 3.6 (calc) (ref <5.0)
Triglycerides: 74 mg/dL (ref <150)

## 2022-09-20 LAB — MAGNESIUM: Magnesium: 2 mg/dL (ref 1.5–2.5)

## 2022-09-22 ENCOUNTER — Encounter: Payer: Self-pay | Admitting: Gastroenterology

## 2022-10-08 ENCOUNTER — Encounter: Payer: Self-pay | Admitting: Nurse Practitioner

## 2022-10-09 ENCOUNTER — Other Ambulatory Visit: Payer: Self-pay | Admitting: Nurse Practitioner

## 2022-10-09 ENCOUNTER — Encounter: Payer: Self-pay | Admitting: Physician Assistant

## 2022-10-09 DIAGNOSIS — K209 Esophagitis, unspecified without bleeding: Secondary | ICD-10-CM

## 2022-10-09 DIAGNOSIS — K5909 Other constipation: Secondary | ICD-10-CM

## 2022-10-12 ENCOUNTER — Telehealth: Payer: Self-pay | Admitting: Nurse Practitioner

## 2022-10-12 NOTE — Telephone Encounter (Signed)
Pt can't get in with GI until August, wanting to know if w can refer her somewhere sooner

## 2022-10-16 NOTE — Telephone Encounter (Signed)
Patient is scheduled for July with [facility], she can ask to be added to wait list.

## 2022-10-24 ENCOUNTER — Encounter: Payer: Self-pay | Admitting: Nurse Practitioner

## 2022-10-24 ENCOUNTER — Encounter: Payer: Self-pay | Admitting: "Endocrinology

## 2022-10-24 ENCOUNTER — Other Ambulatory Visit: Payer: Self-pay | Admitting: Nurse Practitioner

## 2022-10-24 ENCOUNTER — Encounter: Payer: 59 | Attending: Nurse Practitioner | Admitting: Nutrition

## 2022-10-24 VITALS — Ht 67.0 in | Wt 190.0 lb

## 2022-10-24 DIAGNOSIS — E0865 Diabetes mellitus due to underlying condition with hyperglycemia: Secondary | ICD-10-CM

## 2022-10-24 DIAGNOSIS — E1140 Type 2 diabetes mellitus with diabetic neuropathy, unspecified: Secondary | ICD-10-CM | POA: Diagnosis not present

## 2022-10-24 DIAGNOSIS — I10 Essential (primary) hypertension: Secondary | ICD-10-CM

## 2022-10-24 DIAGNOSIS — E1165 Type 2 diabetes mellitus with hyperglycemia: Secondary | ICD-10-CM | POA: Insufficient documentation

## 2022-10-24 DIAGNOSIS — Z7984 Long term (current) use of oral hypoglycemic drugs: Secondary | ICD-10-CM | POA: Insufficient documentation

## 2022-10-24 DIAGNOSIS — Z713 Dietary counseling and surveillance: Secondary | ICD-10-CM | POA: Diagnosis not present

## 2022-10-24 DIAGNOSIS — I679 Cerebrovascular disease, unspecified: Secondary | ICD-10-CM

## 2022-10-24 DIAGNOSIS — Z794 Long term (current) use of insulin: Secondary | ICD-10-CM | POA: Diagnosis not present

## 2022-10-24 DIAGNOSIS — E1169 Type 2 diabetes mellitus with other specified complication: Secondary | ICD-10-CM

## 2022-10-24 MED ORDER — DEXCOM G7 SENSOR MISC
3 refills | Status: DC
Start: 2022-10-24 — End: 2023-03-30

## 2022-10-24 MED ORDER — DEXCOM G7 RECEIVER DEVI
3 refills | Status: DC
Start: 2022-10-24 — End: 2022-10-24

## 2022-10-24 NOTE — Progress Notes (Signed)
Medical Nutrition Therapy  Appointment Start time:  0800  Appointment End time:  0900  Primary concerns today: Type 2 Dm  Referral diagnosis: E11.8 Preferred learning style: No preference   Learning readiness: Ready    NUTRITION ASSESSMENT  57 yr old Wfemale referred for Type 2 DM. A1C 8.7% PCP Dr. Oneta Rack Endocrinologist Dr. Fransico Him FBS: this am was 145. Testing blood sugars 1-2 times per day. Levermir 15 units at night. Metformin 500 mg ER once a day.  Has a nodule on her endocrine glad dx in 2015. To see Dr.Nida, Endocrinology.  She is willing to work with [facility] to focus on more whole plant based foods and the 6 pillars of health to reverse her DM, Obesity and other health issues.  Anthropometrics  Wt Readings from Last 3 Encounters:  09/19/22 190 lb 9.6 oz (86.5 kg)  08/07/22 194 lb 6.4 oz (88.2 kg)  03/10/22 189 lb 12.8 oz (86.1 kg)   Ht Readings from Last 3 Encounters:  09/19/22 5\' 7"  (1.702 m)  08/07/22 5' 7.5" (1.715 m)  03/10/22 5' 7.5" (1.715 m)   There is no height or weight on file to calculate BMI. @BMIFA @ Facility age limit for growth %iles is 20 years. Facility age limit for growth %iles is 20 years.    Clinical Medical Hx:  Past Medical History:  Diagnosis Date   Adrenal adenoma    Allergy    Anemia    Diabetes mellitus without complication (HCC)    Endometriosis of rectovaginal septum    GERD (gastroesophageal reflux disease)    Headache    MIGRAINES   Hyperaldosteronism (HCC)    Possibly   Hyperlipidemia    Hypertension    Hypokalemia    Ovarian cyst    Raynaud disease    hands and feet     Medications:  Current Outpatient Medications on File Prior to Visit  Medication Sig Dispense Refill   Magnesium 250 MG TABS Take by mouth. (Patient not taking: Reported on 01/25/2023)     metFORMIN (GLUCOPHAGE-XR) 500 MG 24 hr tablet TAKE 1 TABLET BY MOUTH TWICE A DAY WITH MEAL AS TOLERATED, FOR DIABETES (Patient not taking: Reported on  01/25/2023) 180 tablet PRN   spironolactone (ALDACTONE) 50 MG tablet Take 50mg  in am and 50mg  in pm (Patient taking differently: Take 50 mg by mouth daily.) 270 tablet 3   Vitamin D, Ergocalciferol, (DRISDOL) 1.25 MG (50000 UNIT) CAPS capsule Take 1 capsule (50,000 Units total) by mouth every 7 (seven) days. 12 capsule 7   Blood Glucose Monitoring Suppl (ONE TOUCH ULTRA MINI) w/Device KIT See admin instructions. for testing  0   glucose blood test strip Use as instructed 100 each 12   Insulin Pen Needle (BD PEN NEEDLE MICRO U/F) 32G X 6 MM MISC USE ONCE DAILY 100 each 2   No current facility-administered medications on file prior to visit.    Labs:     Latest Ref Rng & Units 09/19/2022   10:09 AM 03/10/2022    9:58 AM 11/30/2021    9:47 AM  CMP  Glucose 65 - 99 mg/dL 846  962  952   BUN 7 - 25 mg/dL 13  12  10    Creatinine 0.50 - 1.03 mg/dL 8.41  3.24  4.01   Sodium 135 - 146 mmol/L 137  139  141   Potassium 3.5 - 5.3 mmol/L 4.3  4.7  4.4   Chloride 98 - 110 mmol/L 103  103  106  CO2 20 - 32 mmol/L 26  27  25    Calcium 8.6 - 10.4 mg/dL 9.8  16.1  9.6   Total Protein 6.1 - 8.1 g/dL 7.4  7.7  7.1   Total Bilirubin 0.2 - 1.2 mg/dL 0.7  0.7  0.6   AST 10 - 35 U/L 17  17  15    ALT 6 - 29 U/L 19  18  16     Lipid Panel     Component Value Date/Time   CHOL 235 (H) 09/19/2022 1009   TRIG 74 09/19/2022 1009   HDL 65 09/19/2022 1009   CHOLHDL 3.6 09/19/2022 1009   VLDL 14 04/05/2016 1017   LDLCALC 152 (H) 09/19/2022 1009   Lab Results  Component Value Date   HGBA1C 8.7 (H) 09/19/2022    Notable Signs/Symptoms: Tired, thirsty, frequent urniation  Lifestyle & Dietary Hx Lives by herself. Eating some out and some at home.  Estimated daily fluid intake: 80 oz Supplements: VIt D Sleep: 8 Stress / self-care: Stomach issues are causing a problem Current average weekly physical activity: Walking 2-3 times per week 2-3 miles at a time  24-Hr Dietary Recall First Meal: 1030 Pork  chop biscuit and potato rounds and 1/2 tea 16-20 oz Snack:  Second Meal: 130 liver and onions, collard grreen, papaya smoothie and strawberries, water Snack:  Third Meal: 2 donut sticks Snack:  Beverages: water  Estimated Energy Needs Calories: 1200 Carbohydrate: 135g Protein: 90g Fat: 33g   NUTRITION DIAGNOSIS  NB-1.1 Food and nutrition-related knowledge deficit As related to Diabetes Type 2.  As evidenced by A1c 8.7%.   NUTRITION INTERVENTION  Nutrition education (E-1) on the following topics:  Nutrition and Diabetes education provided on My Plate, CHO counting, meal planning, portion sizes, timing of meals, avoiding snacks between meals unless having a low blood sugar, target ranges for A1C and blood sugars, signs/symptoms and treatment of hyper/hypoglycemia, monitoring blood sugars, taking medications as prescribed, benefits of exercising 30 minutes per day and prevention of complications of DM.  Lifestyle Medicine  - Whole Food, Plant Predominant Nutrition is highly recommended: Eat Plenty of vegetables, Mushrooms, fruits, Legumes, Whole Grains, Nuts, seeds in lieu of processed meats, processed snacks/pastries red meat, poultry, eggs.    -It is better to avoid simple carbohydrates including: Cakes, Sweet Desserts, Ice Cream, Soda (diet and regular), Sweet Tea, Candies, Chips, Cookies, Store Bought Juices, Alcohol in Excess of  1-2 drinks a day, Lemonade,  Artificial Sweeteners, Doughnuts, Coffee Creamers, "Sugar-free" Products, etc, etc.  This is not a complete list.....  Exercise: If you are able: 30 -60 minutes a day ,4 days a week, or 150 minutes a week.  The longer the better.  Combine stretch, strength, and aerobic activities.  If you were told in the past that you have high risk for cardiovascular diseases, you may seek evaluation by your heart doctor prior to initiating moderate to intense exercise programs.   Handouts Provided Include  Know your numbers. [facility] handouts Learning Style & Readiness for Change Teaching method utilized: Visual & Auditory  Demonstrated degree of understanding via: Teach Back  Barriers to learning/adherence to lifestyle change: none  Goals Established by Pt Goals  Eat three meals per day Focus on more whole plant based foods Cut out processed foods, fast foods and junk food Drink only water Exercise 30 minutes daily. Lose 1 lb per week. Get A1C down to 7% Take Metformin after breakfast, not before. Test blood sugars twice a day;  before breakfast and bedtime.   MONITORING & EVALUATION Dietary intake, weekly physical activity, and BS in 1 month.  Next Steps  Patient is to work on meal planning and meal prepping.Marland Kitchen

## 2022-12-13 NOTE — Progress Notes (Signed)
12/15/2022 Kristin Hess [id number] 01-Jul-1965  Referring provider: [name], MD Primary GI doctor: Dr. Adela Hess  ASSESSMENT AND PLAN:   57 year old female with history of poorly controlled type 2 diabetes, GERD presents with epigastric pain, reflux acutely in April, 1 episode of hematemesis mild episode. Patient states her symptoms have nearly resolved with changing her diet, she continues to have some epigastric discomfort on examination but otherwise denies symptoms.  She has had GERD for a long time. Daughter with history of gallbladder issues  -Lifestyle changes discussed, avoid NSAIDS, ETOH -Weight loss discussed with the patient -Will schedule for EGD, Will schedule EGD. I discussed risks of EGD with patient today, including risk of sedation, bleeding or perforation.  Patient provides understanding and gave verbal consent to proceed. Will get right RUQ [place] and consider HIDA Consider GES, gastroparesis diet given -Symptoms have resolved and were not with exertion, however reviewed with patient atypical symptoms of ACS for women and diabetics.  Diarrhea During episode of dyspepsia, currently has resolved with diet changes. Patient due for colonoscopy April of this year -Add on fiber -will schedule for colonoscopy for further evaluation with change in bowel habits and personal history of adenomatous polyps. We have discussed the risks of bleeding, infection, perforation, medication reactions, and remote risk of death associated with colonoscopy. All questions were answered and the patient acknowledges these risk and wishes to proceed.  Uncontrolled type 2 diabetes Has follow-up with dietitian and endocrinologist this month Encouraged continue to work on decreasing A1c States sugars have been improved closer to 140s fasting, she should be able to proceed with endoscopic evaluation   Patient Care Team: Kristin Cowboy, MD as PCP - General (Internal  Medicine) Kristin Coho, MD as Consulting Physician (Obstetrics and Gynecology) Kristin Gaudier, NP as Nurse Practitioner (Nurse Practitioner) Kristin Gaudier, NP as Nurse Practitioner (Nurse Practitioner)  HISTORY OF PRESENT ILLNESS: 57 y.o. female with a past medical history of type 2 diabetes with hyperlipidemia, adrenal adenoma with possible hyperaldosteronism, Raynaud's, GERD, anemia and others listed below presents for evaluation of dyspepsia, nausea, diarrhea.    09/22/2019 colonoscopy  Dr. Adela Hess family history first-degree relative with colorectal cancer, father diagnosed age 88, good prep 3 polyps 3 to 6 mm size, 1 diminutive polyp transverse colon, one 3 mm polyp sigmoid colon, internal hemorrhoids.  Pathology showed 5 tubular adenomatous polyps recall 09/2022 09/19/2022 CPE with primary care complaining of nausea with emesis and small amount of bright red blood.  Diarrhea infrequently.  Gnawing epigastric discomfort.   A1c 8.7, normal CBC, normal magnesium, normal thyroid, normal vitamin D. Patient started on Nexium 40 mg twice daily for 14 days and Carafate.  She states the symptoms came out of no where.  She has severe epigastric pain, gnawing pain, with nausea and vomiting and small volume of blood. She had diarrhea every other day during this time, denies melena or hematochezia.  States she feels she is not absorbing foods well, could be pale yellow/golden yellow. She does have get gallbladder, daughter has GB issues.  She was having reflux into her throat. She was having some dysphagia.  She denies NSAIDS, ETOH, new medications, supplements. No sick contacts.  She was fluctuating on and off of her metformin around this time, metformin can affect her stomach so she tries not to take it.  She was also complaining of hair loss and dry skin around that time.   She never took the nexium but did take the carafate about 7 days  but she was having a hard time swallowing it.  She  started to eat fermented foods, changed her diet and this has helped this.   She states at this time she is not having GERD, trouble swallowing, no AB pain. Her Bm's will fluctuate with soft stools/hard stools/narrow stools. She has AB bloating if she eats hot dogs/processed foods/black beans.  No unintentional weight loss, no fever, chills.   Wt Readings from Last 3 Encounters:  12/15/22 185 lb 2 oz (84 kg)  10/24/22 190 lb (86.2 kg)  09/19/22 190 lb 9.6 oz (86.5 kg)   She denies ETOH use.   She denies tobacco use.  She denies drug use.    She  reports that she has never smoked. She has never used smokeless tobacco. She reports that she does not currently use alcohol. She reports that she does not use drugs.  RELEVANT LABS AND IMAGING: CBC    Component Value Date/Time   WBC 5.1 09/19/2022 1009   RBC 4.81 09/19/2022 1009   HGB 13.1 09/19/2022 1009   HCT 40.3 09/19/2022 1009   PLT 207 09/19/2022 1009   MCV 83.8 09/19/2022 1009   MCH 27.2 09/19/2022 1009   MCHC 32.5 09/19/2022 1009   RDW 12.9 09/19/2022 1009   LYMPHSABS 1,556 09/19/2022 1009   MONOABS 354 10/25/2016 1229   EOSABS 92 09/19/2022 1009   BASOSABS 41 09/19/2022 1009   Recent Labs    03/10/22 0958 09/19/22 1009  HGB 13.0 13.1    CMP     Component Value Date/Time   NA 137 09/19/2022 1009   K 4.3 09/19/2022 1009   CL 103 09/19/2022 1009   CO2 26 09/19/2022 1009   GLUCOSE 199 (H) 09/19/2022 1009   BUN 13 09/19/2022 1009   CREATININE 0.76 09/19/2022 1009   CALCIUM 9.8 09/19/2022 1009   PROT 7.4 09/19/2022 1009   ALBUMIN 4.3 10/25/2016 1229   AST 17 09/19/2022 1009   ALT 19 09/19/2022 1009   ALKPHOS 76 10/25/2016 1229   BILITOT 0.7 09/19/2022 1009   GFRNONAA 93 08/26/2020 1600   GFRAA 107 08/26/2020 1600      Latest Ref Rng & Units 09/19/2022   10:09 AM 03/10/2022    9:58 AM 11/30/2021    9:47 AM  Hepatic Function  Total Protein 6.1 - 8.1 g/dL 7.4  7.7  7.1   AST 10 - 35 U/L 17  17  15    ALT 6 -  29 U/L 19  18  16    Total Bilirubin 0.2 - 1.2 mg/dL 0.7  0.7  0.6       Current Medications:   Current Outpatient Medications (Endocrine & Metabolic):    insulin detemir (LEVEMIR) 100 UNIT/ML FlexPen, INJECT 14-20 UNITS DEPENDING ON MORNING SUGAR ONCE DAILY SUBCUTANEOUSLY.   metFORMIN (GLUCOPHAGE-XR) 500 MG 24 hr tablet, TAKE 1 TABLET BY MOUTH TWICE A DAY WITH MEAL AS TOLERATED, FOR DIABETES  Current Outpatient Medications (Cardiovascular):    spironolactone (ALDACTONE) 50 MG tablet, Take 50mg  in am and 50mg  in pm   Current Outpatient Medications (Analgesics):    aspirin 81 MG EC tablet, Take 81 mg by mouth every morning.  Current Outpatient Medications (Hematological):    Cyanocobalamin (B-12 PO), Take 1 tablet by mouth daily. Reported on 12/14/2015   FOLIC ACID PO, Take by mouth.   Iron TABS, Take 1 tablet by mouth daily. Reported on 09/08/2015  Current Outpatient Medications (Other):    ascorbic acid (VITAMIN C) 250 MG tablet,  Take by mouth.   Blood Glucose Monitoring Suppl (ONE TOUCH ULTRA MINI) w/Device KIT, See admin instructions. for testing   Continuous Glucose Receiver (DEXCOM G7 RECEIVER) DEVI, 1 receiver   Continuous Glucose Sensor (DEXCOM G7 SENSOR) MISC, Apply new sensor every 10 days   glucose blood test strip, Use as instructed   Insulin Pen Needle (BD PEN NEEDLE MICRO U/F) 32G X 6 MM MISC, USE ONCE DAILY   Magnesium 250 MG TABS, Take by mouth.   Vitamin D, Ergocalciferol, (DRISDOL) 1.25 MG (50000 UNIT) CAPS capsule, Take 1 capsule (50,000 Units total) by mouth every 7 (seven) days.  Medical History:  Past Medical History:  Diagnosis Date   Adrenal adenoma    Allergy    Anemia    Diabetes mellitus without complication (HCC)    Endometriosis of rectovaginal septum    GERD (gastroesophageal reflux disease)    Headache    MIGRAINES   Hyperaldosteronism (HCC)    Possibly   Hyperlipidemia    Hypertension    Hypokalemia    Ovarian cyst    Raynaud disease     hands and feet    Allergies:  Allergies  Allergen Reactions   Ace Inhibitors Cough   Hctz [Hydrochlorothiazide] Cough     Surgical History:  She  has a past surgical history that includes Cesarean section; Cardiac catheterization; Tubal ligation; and Robotic assisted total hysterectomy with bilateral salpingo oophorectomy (Bilateral, 06/16/2014). Family History:  Her family history includes Colon cancer (age of onset: 22) in her father; Diabetes in her father and mother; Hepatitis C in her father; Hypertension in her brother; Lung cancer in her mother; Lupus in her maternal aunt and paternal aunt; Pancreatic cancer in her father; Scleroderma in her maternal grandmother; Stroke in her brother, maternal grandfather, and mother.  REVIEW OF SYSTEMS  : All other systems reviewed and negative except where noted in the History of Present Illness.  PHYSICAL EXAM: BP 114/80 (BP Location: Left Arm, Patient Position: Sitting, Cuff Size: Normal)   Pulse 76   Ht 5' 6.5" (1.689 m)   Wt 185 lb 2 oz (84 kg)   LMP 05/16/2014   BMI 29.43 kg/m  General Appearance: Well nourished, in no apparent distress. Head:   Normocephalic and atraumatic. Eyes:  sclerae anicteric,conjunctive pink  Respiratory: Respiratory effort normal, BS equal bilaterally without rales, rhonchi, wheezing. Cardio: RRR with no MRGs. Peripheral pulses intact.  Abdomen: Soft,  Obese ,active bowel sounds. mild tenderness in the epigastrium. Without guarding and Without rebound. No masses. Rectal: Not evaluated Musculoskeletal: Full ROM, Normal gait. Without edema. Skin:  Dry and intact without significant lesions or rashes Neuro: Alert and  oriented x4;  No focal deficits. Psych:  Cooperative. Normal mood and affect.    Doree Albee, PA-C 2:21 PM

## 2022-12-15 ENCOUNTER — Encounter: Payer: Self-pay | Admitting: Physician Assistant

## 2022-12-15 ENCOUNTER — Ambulatory Visit (INDEPENDENT_AMBULATORY_CARE_PROVIDER_SITE_OTHER): Payer: 59 | Admitting: Physician Assistant

## 2022-12-15 VITALS — BP 114/80 | HR 76 | Ht 66.5 in | Wt 185.1 lb

## 2022-12-15 DIAGNOSIS — R1013 Epigastric pain: Secondary | ICD-10-CM

## 2022-12-15 DIAGNOSIS — Z86010 Personal history of colonic polyps: Secondary | ICD-10-CM

## 2022-12-15 DIAGNOSIS — R112 Nausea with vomiting, unspecified: Secondary | ICD-10-CM | POA: Diagnosis not present

## 2022-12-15 DIAGNOSIS — A09 Infectious gastroenteritis and colitis, unspecified: Secondary | ICD-10-CM

## 2022-12-15 DIAGNOSIS — E0865 Diabetes mellitus due to underlying condition with hyperglycemia: Secondary | ICD-10-CM

## 2022-12-15 MED ORDER — NA SULFATE-K SULFATE-MG SULF 17.5-3.13-1.6 GM/177ML PO SOLN: 1.0000 | Freq: Once | ORAL | 0 refills | Status: AC

## 2022-12-15 NOTE — Patient Instructions (Signed)
You have been scheduled for an abdominal ultrasound at [facility]  (1st floor of hospital) on 01/15/2023 at 8:30am. Please arrive 30 minutes prior to your appointment for registration. Make certain not to have anything to eat or drink 6 hours prior to your appointment. Should you need to reschedule your appointment, please contact radiology at [phone]. This test typically takes about 30 minutes to perform.no eatting after 12 pm   You have been scheduled for a colonoscopy. Please follow written instructions given to you at your visit today.   Please pick up your prep supplies at the pharmacy within the next 1-3 days.  If you use inhalers (even only as needed), please bring them with you on the day of your procedure.  DO NOT TAKE 7 DAYS PRIOR TO TEST- Trulicity (dulaglutide) Ozempic, Wegovy (semaglutide) Mounjaro (tirzepatide) Bydureon Bcise (exanatide extended release)  DO NOT TAKE 1 DAY PRIOR TO YOUR TEST Rybelsus (semaglutide) Adlyxin (lixisenatide) Victoza (liraglutide) Byetta (exanatide) ___________________________________________________________________________  Avoid spicy and acidic foods Avoid fatty foods Limit your intake of coffee, tea, alcohol, and carbonated drinks Work to maintain a healthy weight Keep the head of the bed elevated at least 3 inches with blocks or a wedge pillow if you are having any nighttime symptoms Stay upright for 2 hours after eating Avoid meals and snacks three to four hours before bedtime  FIBER SUPPLEMENT You can do metamucil or fibercon once or twice a day but if this causes gas/bloating please switch to Benefiber or Citracel.  Fiber is good for constipation/diarrhea/irritable bowel syndrome.  It can also help with weight loss and can help lower your bad cholesterol (LDL).  Please do 1 TBSP in the morning in water, coffee, or tea.  It can take up to a month before you can see a difference with your bowel movements.  It is cheapest from  [employer], [employer], [employer].   Fiber Chart  You should be consuming 25-30g of fiber per day and drinking 8 glasses of water to help your bowels move regularly.  In the chart below you can look up how much fiber you are getting in an average day.  If you are not getting enough fiber, you should add a fiber supplement to your diet.  Examples of this include Metamucil, FiberCon and Citrucel.  These can be purchased at your local grocery store or pharmacy.      [email or web address]    Gastroparesis Please do small frequent meals like 4-6 meals a day.  Eat and drink liquids at separate times.  Avoid high fiber foods, cook your vegetables, avoid high fat food.  Suggest spreading protein throughout the day (greek yogurt, glucerna, soft meat, milk, eggs) Choose soft foods that you can mash with a fork When you are more symptomatic, change to pureed foods foods and liquids.  Consider reading "Living well with Gastroparesis" by Reuel Derby Gastroparesis is a condition in which food takes longer than normal to empty from the stomach. This condition is also known as delayed gastric emptying. It is usually a long-term (chronic) condition. There is no cure, but there are treatments and things that you can do at home to help relieve symptoms. Treating the underlying condition that causes gastroparesis can also help relieve symptoms What are the causes? In many cases, the cause of this condition is not known. Possible causes include: A hormone (endocrine) disorder, such as hypothyroidism or diabetes. A nervous system disease, such as Parkinson's disease or multiple sclerosis. Cancer, infection, or surgery that affects the  stomach or vagus nerve. The vagus nerve runs from your chest, through your neck, and to the lower part of your brain. A connective tissue disorder, such as scleroderma. Certain medicines. What increases the risk? You are more  likely to develop this condition if: You have certain disorders or diseases. These may include: An endocrine disorder. An eating disorder. Amyloidosis. Scleroderma. Parkinson's disease. Multiple sclerosis. Cancer or infection of the stomach or the vagus nerve. You have had surgery on your stomach or vagus nerve. You take certain medicines. You are female. What are the signs or symptoms? Symptoms of this condition include: Feeling full after eating very little or a loss of appetite. Nausea, vomiting, or heartburn. Bloating of your abdomen. Inconsistent blood sugar (glucose) levels on blood tests. Unexplained weight loss. Acid from the stomach coming up into the esophagus (gastroesophageal reflux). Sudden tightening (spasm) of the stomach, which can be painful. Symptoms may come and go. Some people may not notice any symptoms. How is this diagnosed? This condition is diagnosed with tests, such as: Tests that check how long it takes food to move through the stomach and intestines. These tests include: Upper gastrointestinal (GI) series. For this test, you drink a liquid that shows up well on X-rays, and then X-rays are taken of your intestines. Gastric emptying scintigraphy. For this test, you eat food that contains a small amount of radioactive material, and then scans are taken. Wireless capsule GI monitoring system. For this test, you swallow a pill (capsule) that records information about how foods and fluid move through your stomach. Gastric manometry. For this test, a tube is passed down your throat and into your stomach to measure electrical and muscular activity. Endoscopy. For this test, a long, thin tube with a camera and light on the end is passed down your throat and into your stomach to check for problems in your stomach lining. Ultrasound. This test uses sound waves to create images of the inside of your body. This can help rule out gallbladder disease or pancreatitis as a  cause of your symptoms. How is this treated? There is no cure for this condition, but treatment and home care may relieve symptoms. Treatment may include: Treating the underlying cause. Managing your symptoms by making changes to your diet and exercise habits. Taking medicines to control nausea and vomiting and to stimulate stomach muscles. Getting food through a feeding tube in the hospital. This may be done in severe cases. Having surgery to insert a device called a gastric electrical stimulator into your body. This device helps improve stomach emptying and control nausea and vomiting. Follow these instructions at home: Take over-the-counter and prescription medicines only as told by your health care provider. Follow instructions from your health care provider about eating or drinking restrictions. Your health care provider may recommend that you: Eat smaller meals more often. Eat low-fat foods. Eat low-fiber forms of high-fiber foods. For example, eat cooked vegetables instead of raw vegetables. Have only liquid foods instead of solid foods. Liquid foods are easier to digest. Drink enough fluid to keep your urine pale yellow. Exercise as often as told by your health care provider. Keep all follow-up visits. This is important. Contact a health care provider if you: Notice that your symptoms do not improve with treatment. Have new symptoms. Get help right away if you: Have severe pain in your abdomen that does not improve with treatment. Have nausea that is severe or does not go away. Vomit every time you drink fluids.  Summary Gastroparesis is a long-term (chronic) condition in which food takes longer than normal to empty from the stomach. Symptoms include nausea, vomiting, heartburn, bloating of your abdomen, and loss of appetite. Eating smaller portions, low-fat foods, and low-fiber forms of high-fiber foods may help you manage your symptoms. Get help right away if you have severe  pain in your abdomen. This information is not intended to replace advice given to you by your health care provider. Make sure you discuss any questions you have with your health care provider. Document Revised: 09/29/2019 Document Reviewed: 09/29/2019 Elsevier Patient Education  2021 [employer].

## 2022-12-17 NOTE — Progress Notes (Signed)
Agree with assessment and plan as outlined.  

## 2022-12-20 ENCOUNTER — Other Ambulatory Visit: Payer: Self-pay | Admitting: "Endocrinology

## 2022-12-20 ENCOUNTER — Encounter: Payer: Self-pay | Admitting: "Endocrinology

## 2022-12-20 ENCOUNTER — Ambulatory Visit: Payer: 59 | Admitting: "Endocrinology

## 2022-12-20 VITALS — BP 110/76 | HR 72 | Ht 66.5 in | Wt 188.2 lb

## 2022-12-20 DIAGNOSIS — E785 Hyperlipidemia, unspecified: Secondary | ICD-10-CM

## 2022-12-20 DIAGNOSIS — E782 Mixed hyperlipidemia: Secondary | ICD-10-CM | POA: Diagnosis not present

## 2022-12-20 DIAGNOSIS — E1169 Type 2 diabetes mellitus with other specified complication: Secondary | ICD-10-CM

## 2022-12-20 LAB — POCT GLYCOSYLATED HEMOGLOBIN (HGB A1C)

## 2022-12-20 MED ORDER — INSULIN DETEMIR 100 UNIT/ML FLEXPEN
20.0000 [IU] | PEN_INJECTOR | Freq: Every day | SUBCUTANEOUS | 2 refills | Status: DC
Start: 2022-12-20 — End: 2022-12-20

## 2022-12-20 MED ORDER — INSULIN GLARGINE 100 UNIT/ML SOLOSTAR PEN
20.0000 [IU] | PEN_INJECTOR | Freq: Every day | SUBCUTANEOUS | 1 refills | Status: DC
Start: 2022-12-20 — End: 2023-01-17

## 2022-12-20 NOTE — Patient Instructions (Signed)
                                      Advice for Weight Management  -For most of us the best way to lose weight is by diet management. Generally speaking, diet management means consuming less calories intentionally which over time brings about progressive weight loss.  This can be achieved more effectively by avoiding ultra processed carbohydrates, processed meats, unhealthy fats.    It is critically important to know your numbers: how much calorie you are consuming and how much calorie you need. More importantly, our carbohydrates sources should be unprocessed naturally occurring  complex starch food items.  It is always important to balance nutrition also by  appropriate intake of proteins (mainly plant-based), healthy fats/oils, plenty of fruits and vegetables.   -The American College of Lifestyle Medicine (ACL M) recommends nutrition derived mostly from Whole Food, Plant Predominant Sources example an apple instead of applesauce or apple pie. Eat Plenty of vegetables, Mushrooms, fruits, Legumes, Whole Grains, Nuts, seeds in lieu of processed meats, processed snacks/pastries red meat, poultry, eggs.  Use only water or unsweetened tea for hydration.  The College also recommends the need to stay away from risky substances including alcohol, smoking; obtaining 7-9 hours of restorative sleep, at least 150 minutes of moderate intensity exercise weekly, importance of healthy social connections, and being mindful of stress and seek help when it is overwhelming.    -Sticking to a routine mealtime to eat 3 meals a day and avoiding unnecessary snacks is shown to have a big role in weight control. Under normal circumstances, the only time we burn stored energy is when we are hungry, so allow  some hunger to take place- hunger means no food between appropriate meal times, only water.  It is not advisable to starve.   -It is better to avoid simple carbohydrates including:  Cakes, Sweet Desserts, Ice Cream, Soda (diet and regular), Sweet Tea, Candies, Chips, Cookies, Store Bought Juices, Alcohol in Excess of  1-2 drinks a day, Lemonade,  Artificial Sweeteners, Doughnuts, Coffee Creamers, "Sugar-free" Products, etc, etc.  This is not a complete list.....    -Consulting with certified diabetes educators is proven to provide you with the most accurate and current information on diet.  Also, you may be  interested in discussing diet options/exchanges , we can schedule a visit with Kristin Hess, RDN, CDE for individualized nutrition education.  -Exercise: If you are able: 30 -60 minutes a day ,4 days a week, or 150 minutes of moderate intensity exercise weekly.    The longer the better if tolerated.  Combine stretch, strength, and aerobic activities.  If you were told in the past that you have high risk for cardiovascular diseases, or if you are currently symptomatic, you may seek evaluation by your heart doctor prior to initiating moderate to intense exercise programs.                                  Additional Care Considerations for Diabetes/Prediabetes   -Diabetes  is a chronic disease.  The most important care consideration is regular follow-up with your diabetes care provider with the goal being avoiding or delaying its complications and to take advantage of advances in medications and technology.  If appropriate actions are taken early enough, type 2 diabetes can even be   reversed.  Seek information from the right source.  - Whole Food, Plant Predominant Nutrition is highly recommended: Eat Plenty of vegetables, Mushrooms, fruits, Legumes, Whole Grains, Nuts, seeds in lieu of processed meats, processed snacks/pastries red meat, poultry, eggs as recommended by American College of  Lifestyle Medicine (ACLM).  -Type 2 diabetes is known to coexist with other important comorbidities such as high blood pressure and high cholesterol.  It is critical to control not only the  diabetes but also the high blood pressure and high cholesterol to minimize and delay the risk of complications including coronary artery disease, stroke, amputations, blindness, etc.  The good news is that this diet recommendation for type 2 diabetes is also very helpful for managing high cholesterol and high blood blood pressure.  - Studies showed that people with diabetes will benefit from a class of medications known as ACE inhibitors and statins.  Unless there are specific reasons not to be on these medications, the standard of care is to consider getting one from these groups of medications at an optimal doses.  These medications are generally considered safe and proven to help protect the heart and the kidneys.    - People with diabetes are encouraged to initiate and maintain regular follow-up with eye doctors, foot doctors, dentists , and if necessary heart and kidney doctors.     - It is highly recommended that people with diabetes quit smoking or stay away from smoking, and get yearly  flu vaccine and pneumonia vaccine at least every 5 years.  See above for additional recommendations on exercise, sleep, stress management , and healthy social connections.      

## 2022-12-20 NOTE — Progress Notes (Signed)
Endocrinology Consult Note       12/20/2022, 6:50 PM   Subjective:    Patient ID: Kristin Hess, female    DOB: 19-May-1966.  Kristin Hess is being seen in consultation for management of currently uncontrolled symptomatic diabetes requested by  Raynelle Dick, NP.   Past Medical History:  Diagnosis Date   Adrenal adenoma    Allergy    Anemia    Diabetes mellitus without complication (HCC)    Endometriosis of rectovaginal septum    GERD (gastroesophageal reflux disease)    Headache    MIGRAINES   Hyperaldosteronism (HCC)    Possibly   Hyperlipidemia    Hypertension    Hypokalemia    Ovarian cyst    Raynaud disease    hands and feet     Past Surgical History:  Procedure Laterality Date   CARDIAC CATHETERIZATION     CESAREAN SECTION     X 2   ROBOTIC ASSISTED TOTAL HYSTERECTOMY WITH BILATERAL SALPINGO OOPHERECTOMY Bilateral 06/16/2014   Procedure: ROBOTIC ASSISTED TOTAL HYSTERECTOMY WITH BILATERAL SALPINGO OOPHORECTOMY, LYSIS OF ADHESIONS;  Surgeon: Adolphus Birchwood, MD;  Location: [facility];  Service: Gynecology;  Laterality: Bilateral;   TUBAL LIGATION      Social History   Socioeconomic History   Marital status: Divorced    Spouse name: Richard   Number of children: 2   Years of education: 14   Highest education level: Not on file  Occupational History   Occupation: Works from home at [employer]  Tobacco Use   Smoking status: Never   Smokeless tobacco: Never  Vaping Use   Vaping status: Never Used  Substance and Sexual Activity   Alcohol use: Not Currently    Comment: occasional   Drug use: No   Sexual activity: Not Currently  Other Topics Concern   Not on file  Social History Narrative   Lives at home w/ youngest daughter   Right-handed   Drinks about 16-32 oz of caffeinated beverages per day   Social Determinants of Health   Financial Resource Strain: Not on file   Food Insecurity: Not on file  Transportation Needs: Not on file  Physical Activity: Not on file  Stress: Not on file  Social Connections: Not on file    Family History  Problem Relation Age of Onset   Stroke Mother    Diabetes Mother    Lung cancer Mother    Diabetes Father    Hepatitis C Father    Colon cancer Father 50   Pancreatic cancer Father    Stroke Brother    Hypertension Brother    Scleroderma Maternal Grandmother    Stroke Maternal Grandfather    Lupus Maternal Aunt    Lupus Paternal Aunt    Breast cancer Neg Hx    Esophageal cancer Neg Hx    Stomach cancer Neg Hx     Outpatient Encounter Medications as of 12/20/2022  Medication Sig   Blood Glucose Monitoring Suppl (ONE TOUCH ULTRA MINI) w/Device KIT See admin instructions. for testing   Continuous Glucose Receiver (DEXCOM G7 RECEIVER) DEVI 1 receiver   Continuous  Glucose Sensor (DEXCOM G7 SENSOR) MISC Apply new sensor every 10 days   glucose blood test strip Use as instructed   Insulin Pen Needle (BD PEN NEEDLE MICRO U/F) 32G X 6 MM MISC USE ONCE DAILY   Magnesium 250 MG TABS Take by mouth.   metFORMIN (GLUCOPHAGE-XR) 500 MG 24 hr tablet TAKE 1 TABLET BY MOUTH TWICE A DAY WITH MEAL AS TOLERATED, FOR DIABETES   spironolactone (ALDACTONE) 50 MG tablet Take 50mg  in am and 50mg  in pm (Patient taking differently: Take 50 mg by mouth daily.)   Vitamin D, Ergocalciferol, (DRISDOL) 1.25 MG (50000 UNIT) CAPS capsule Take 1 capsule (50,000 Units total) by mouth every 7 (seven) days.   [DISCONTINUED] ascorbic acid (VITAMIN C) 250 MG tablet Take by mouth.   [DISCONTINUED] aspirin 81 MG EC tablet Take 81 mg by mouth every morning.   [DISCONTINUED] Cyanocobalamin (B-12 PO) Take 1 tablet by mouth daily. Reported on 12/14/2015   [DISCONTINUED] FOLIC ACID PO Take by mouth.   [DISCONTINUED] insulin detemir (LEVEMIR) 100 UNIT/ML FlexPen INJECT 14-20 UNITS DEPENDING ON MORNING SUGAR ONCE DAILY SUBCUTANEOUSLY. (Patient taking  differently: 15 Units at bedtime.)   [DISCONTINUED] insulin detemir (LEVEMIR) 100 UNIT/ML FlexPen Inject 20 Units into the skin at bedtime.   [DISCONTINUED] Iron TABS Take 1 tablet by mouth daily. Reported on 09/08/2015   No facility-administered encounter medications on file as of 12/20/2022.    ALLERGIES: Allergies  Allergen Reactions   Ace Inhibitors Cough   Hctz [Hydrochlorothiazide] Cough    VACCINATION STATUS: Immunization History  Administered Date(s) Administered   Hepatitis B, PED/ADOLESCENT 07/30/2022, 08/31/2022   Influenza-Unspecified 07/29/2022   Moderna Sars-Covid-2 Vaccination 06/11/2020, 07/09/2020   Td 06/06/2007, 07/29/2022    Diabetes She presents for her initial diabetic visit. She has type 2 diabetes mellitus. Onset time: She was diagnosed at approximate age of 44 years. There are no hypoglycemic associated symptoms. Pertinent negatives for hypoglycemia include no confusion, headaches, pallor or seizures. Associated symptoms include fatigue, polydipsia and polyuria. Pertinent negatives for diabetes include no chest pain and no polyphagia. There are no hypoglycemic complications. Symptoms are worsening. There are no diabetic complications. Risk factors for coronary artery disease include diabetes mellitus, dyslipidemia, family history and hypertension. Current diabetic treatments: She is on Levemir 15 units nightly and metformin 500 mg p.o. once a day. Her weight is fluctuating minimally. She is following a generally unhealthy diet. She has had a previous visit with a dietitian. She participates in exercise intermittently. Her overall blood glucose range is 140-180 mg/dl. (She did not bring her meter nor CGM device with her.  She did have point-of-care A1c today at 8.1% improving.  She denies recent hypoglycemia.) An ACE inhibitor/angiotensin II receptor blocker is contraindicated (ACE inhibitor's and hydrochlorothiazide are listed as allergies.).  Hyperlipidemia This is a  chronic problem. The current episode started more than 1 year ago. Exacerbating diseases include diabetes. Pertinent negatives include no chest pain, myalgias or shortness of breath. She is currently on no antihyperlipidemic treatment. Risk factors for coronary artery disease include diabetes mellitus, dyslipidemia, hypertension, post-menopausal and family history.  Hypertension This is a chronic problem. The problem is controlled. Pertinent negatives include no chest pain, headaches, palpitations or shortness of breath. Risk factors for coronary artery disease include dyslipidemia, diabetes mellitus and post-menopausal state. Treatments tried: Spironolactone 50 mg once a day.     Review of Systems  Constitutional:  Positive for fatigue. Negative for chills, fever and unexpected weight change.  HENT:  Negative for  trouble swallowing and voice change.   Eyes:  Negative for visual disturbance.  Respiratory:  Negative for cough, shortness of breath and wheezing.   Cardiovascular:  Negative for chest pain, palpitations and leg swelling.  Gastrointestinal:  Negative for diarrhea, nausea and vomiting.  Endocrine: Positive for polydipsia and polyuria. Negative for cold intolerance, heat intolerance and polyphagia.  Musculoskeletal:  Negative for arthralgias and myalgias.  Skin:  Negative for color change, pallor, rash and wound.  Neurological:  Negative for seizures and headaches.  Psychiatric/Behavioral:  Negative for confusion and suicidal ideas.     Objective:       12/20/2022    2:41 PM 12/15/2022    1:44 PM 10/24/2022    8:16 AM  Vitals with BMI  Height 5' 6.5" 5' 6.5" 5\' 7"   Weight 188 lbs 3 oz 185 lbs 2 oz 190 lbs  BMI 29.92 29.44 29.75  Systolic 110 114   Diastolic 76 80   Pulse 72 76     BP 110/76   Pulse 72   Ht 5' 6.5" (1.689 m)   Wt 188 lb 3.2 oz (85.4 kg)   LMP 05/16/2014   BMI 29.92 kg/m   Wt Readings from Last 3 Encounters:  12/20/22 188 lb 3.2 oz (85.4 kg)  12/15/22  185 lb 2 oz (84 kg)  10/24/22 190 lb (86.2 kg)     Physical Exam Constitutional:      Appearance: She is well-developed.  HENT:     Head: Normocephalic and atraumatic.  Neck:     Thyroid: No thyromegaly.     Trachea: No tracheal deviation.  Cardiovascular:     Rate and Rhythm: Normal rate and regular rhythm.  Pulmonary:     Effort: Pulmonary effort is normal.     Breath sounds: Normal breath sounds.  Abdominal:     General: Bowel sounds are normal.     Palpations: Abdomen is soft.     Tenderness: There is no abdominal tenderness. There is no guarding.  Musculoskeletal:        General: Normal range of motion.     Cervical back: Normal range of motion and neck supple.  Skin:    General: Skin is warm and dry.     Coloration: Skin is not pale.     Findings: No erythema or rash.  Neurological:     Mental Status: She is alert and oriented to person, place, and time.     Cranial Nerves: No cranial nerve deficit.     Sensory: No sensory deficit.     Coordination: Coordination normal.     Deep Tendon Reflexes: Reflexes are normal and symmetric.  Psychiatric:        Judgment: Judgment normal.    CMP ( most recent) CMP     Component Value Date/Time   NA 137 09/19/2022 1009   K 4.3 09/19/2022 1009   CL 103 09/19/2022 1009   CO2 26 09/19/2022 1009   GLUCOSE 199 (H) 09/19/2022 1009   BUN 13 09/19/2022 1009   CREATININE 0.76 09/19/2022 1009   CALCIUM 9.8 09/19/2022 1009   PROT 7.4 09/19/2022 1009   ALBUMIN 4.3 10/25/2016 1229   AST 17 09/19/2022 1009   ALT 19 09/19/2022 1009   ALKPHOS 76 10/25/2016 1229   BILITOT 0.7 09/19/2022 1009   EGFR 91 09/19/2022 1009   GFRNONAA 93 08/26/2020 1600   Diabetic Labs (most recent): Lab Results  Component Value Date   HGBA1C 8.7 (H) 09/19/2022   HGBA1C  8.0 (H) 03/10/2022   HGBA1C 8.7 (H) 11/30/2021   MICROALBUR <0.2 09/19/2022   MICROALBUR 0.8 08/29/2021   MICROALBUR 0.7 07/07/2019     Lipid Panel ( most recent) Lipid Panel      Component Value Date/Time   CHOL 235 (H) 09/19/2022 1009   TRIG 74 09/19/2022 1009   HDL 65 09/19/2022 1009   CHOLHDL 3.6 09/19/2022 1009   VLDL 14 04/05/2016 1017   LDLCALC 152 (H) 09/19/2022 1009      Lab Results  Component Value Date   TSH 1.89 09/19/2022   TSH 2.68 03/10/2022   TSH 1.75 08/29/2021   TSH 1.22 05/12/2021   TSH 1.68 08/26/2020   TSH 2.18 01/22/2020   TSH 1.93 10/20/2019   TSH 2.37 07/07/2019   TSH 1.40 01/06/2019   TSH 2.36 02/19/2017       Assessment & Plan:   1. Type 2 diabetes mellitus with hyperlipidemia (HCC) 2. Mixed hyperlipidemia  - Ariyah Jasmeet Manton has currently uncontrolled symptomatic type 2 DM since  57 years of age,  with most recent A1c of 8.1 %. Recent labs reviewed. - I had a long discussion with her about the possible risk factors and  the pathology behind its diabetes and its complications. -her diabetes is complicated by comorbid hyperlipidemia, hypertension and she remains at a high risk for more acute and chronic complications which include CAD, CVA, CKD, retinopathy, and neuropathy. These are all discussed in detail with her.  - I discussed all available options of managing her diabetes including de-escalation of medications. I have counseled her on Food as Medicine by adopting a Whole Food , Plant Predominant  ( WFPP) nutrition as recommended by [employer] of Lifestyle Medicine. Patient is encouraged to switch to  unprocessed or minimally processed  complex starch, adequate protein intake (mainly plant source), minimal liquid fat, plenty of fruits, and vegetables. -  she is advised to stick to a routine mealtimes to eat 3 complete meals a day and snack only when necessary ( to snack only to correct hypoglycemia BG <70 day time or <100 at night).   - she acknowledges that there is a room for improvement in her food and drink choices. - Further Specific Suggestion is made for her to avoid simple carbohydrates  from her diet  including Cakes, Sweet Desserts, Ice Cream, Soda (diet and regular), Sweet Tea, Candies, Chips, Cookies, Store Bought Juices, Alcohol ,  Artificial Sweeteners,  Coffee Creamer, and "Sugar-free" Products. This will help patient to have more stable blood glucose profile and potentially avoid unintended weight gain.  The following Lifestyle Medicine recommendations according to American College of Lifestyle Medicine [facility]) were discussed and offered to patient and she agrees to start the journey:  A.  Whole Foods, Plant-based plate comprising of fruits and vegetables, plant-based proteins, whole-grain carbohydrates was discussed in detail with the patient.   A list for source of those nutrients were also provided to the patient.  Patient will use only water or unsweetened tea for hydration. B.  The need to stay away from risky substances including alcohol, smoking; obtaining 7 to 9 hours of restorative sleep, at least 150 minutes of moderate intensity exercise weekly, the importance of healthy social connections,  and stress reduction techniques were discussed. C.  A full color page of  Calorie density of various food groups per pound showing examples of each food groups was provided to the patient.  - she will be scheduled with Norm Salt, RDN, CDE  for individualized diabetes education.  - I have approached her with the following individualized plan to manage  her diabetes and patient agrees:   -To achieve control of glycemia to target, she is advised to increase her Levemir to 20 units nightly, advised to continue to monitor her blood glucose using her Dexcom continuously.    -She is also advised to continue metformin 500 mg p.o. once daily at breakfast.   - she is warned not to take insulin without proper monitoring per orders. - Adjustment parameters are given to her for hypo and hyperglycemia in writing. - she is encouraged to call clinic for blood glucose levels less than 70 or above 200 mg  /dl. -Patient is motivated for the prescription.  She has a chance depending on her engagement with lifestyle medicine.  - she will be considered for incretin therapy as appropriate next visit.  - Specific targets for  A1c;  LDL, HDL,  and Triglycerides were discussed with the patient.  2) Blood Pressure /Hypertension:  her blood pressure is  controlled to target.  ACE inhibitor's and hydrochlorothiazide are listed as allergies.  She is advised to continue spironolactone 50 mg once a day.  As she wishes, she will be considered for the prescription on subsequent visits.    3) Lipids/Hyperlipidemia:   Review of her recent lipid panel showed uncontrolled  LDL at 152 .  she wishes to avoid statin medications for now.  She will have repeat fasting lipid panel before her next visit in 3 months.  If her LDL remains above 100 mg per DL, she will be considered for low-dose statins.  Complications of uncontrolled DM/dyslipidemia discussed with her.  4)  Weight/Diet:  Body mass index is 29.92 kg/m.  -   clearly complicating her diabetes care.   she is  a candidate for weight loss. I discussed with her the fact that loss of 5 - 10% of her  current body weight will have the most impact on her diabetes management.  The above detailed  ACLM recommendations for nutrition, exercise, sleep, social life, avoidance of risky substances, the need for restorative sleep   information will also detailed on discharge instructions.  5) Chronic Care/Health Maintenance:  -she  is not on  ACEI/ARB and Statin medications and  is encouraged to initiate and continue to follow up with Ophthalmology, Dentist,  Podiatrist at least yearly or according to recommendations, and advised to   stay away from smoking. I have recommended yearly flu vaccine and pneumonia vaccine at least every 5 years; moderate intensity exercise for up to 150 minutes weekly; and  sleep for 7- 9 hours a day.  - she is  advised to maintain close follow up  with Raynelle Dick, NP for primary care needs, as well as her other providers for optimal and coordinated care.   Thank you for involving me in the care of this pleasant patient.  I spent  61  minutes in the care of the patient today including review of labs from CMP, Lipids, Thyroid Function, Hematology (current and previous including abstractions from other facilities); face-to-face time discussing  her blood glucose readings/logs, discussing hypoglycemia and hyperglycemia episodes and symptoms, medications doses, her options of short and long term treatment based on the latest standards of care / guidelines;  discussion about incorporating lifestyle medicine;  and documenting the encounter. Risk reduction counseling performed per USPSTF guidelines to reduce  obesity and cardiovascular risk factors.      Please refer  to Patient Instructions for Blood Glucose Monitoring and Insulin/Medications Dosing Guide"  in media tab for additional information. Please  also refer to " Patient Self Inventory" in the Media  tab for reviewed elements of pertinent patient history.  Jose Donnie Aho participated in the discussions, expressed understanding, and voiced agreement with the above plans.  All questions were answered to her satisfaction. she is encouraged to contact clinic should she have any questions or concerns prior to her return visit.   Follow up plan: - Return in about 3 months (around 03/22/2023) for F/U with Pre-visit Labs, Meter/CGM/Logs, A1c here.  Marquis Lunch, MD [facility] [address] [place], [place] [address] Phone: [phone]  Fax: [phone] 1  12/20/2022, 6:50 PM  This note was partially dictated with voice recognition software. Similar sounding words can be transcribed inadequately or may not  be corrected upon review.

## 2022-12-26 ENCOUNTER — Ambulatory Visit: Payer: 59 | Admitting: Nurse Practitioner

## 2022-12-27 ENCOUNTER — Encounter: Payer: Self-pay | Admitting: Nutrition

## 2022-12-27 ENCOUNTER — Encounter: Payer: 59 | Attending: Nurse Practitioner | Admitting: Nutrition

## 2022-12-27 DIAGNOSIS — E785 Hyperlipidemia, unspecified: Secondary | ICD-10-CM | POA: Diagnosis not present

## 2022-12-27 DIAGNOSIS — E0865 Diabetes mellitus due to underlying condition with hyperglycemia: Secondary | ICD-10-CM | POA: Diagnosis not present

## 2022-12-27 DIAGNOSIS — E1169 Type 2 diabetes mellitus with other specified complication: Secondary | ICD-10-CM | POA: Diagnosis not present

## 2022-12-27 DIAGNOSIS — I679 Cerebrovascular disease, unspecified: Secondary | ICD-10-CM | POA: Diagnosis not present

## 2022-12-27 DIAGNOSIS — E1065 Type 1 diabetes mellitus with hyperglycemia: Secondary | ICD-10-CM | POA: Diagnosis not present

## 2022-12-27 DIAGNOSIS — I10 Essential (primary) hypertension: Secondary | ICD-10-CM | POA: Diagnosis not present

## 2022-12-27 NOTE — Patient Instructions (Addendum)
Increase dark leafy greens 3 times per week. Get back to walking twice a week 30 minutes. Increase as tolerated 150 minutes per day Aim TIR reading to close 75%. Continue to take 20 units

## 2022-12-27 NOTE — Progress Notes (Signed)
Medical Nutrition Therapy  Appointment Start time:  0800  Appointment End time:  0900  Primary concerns today: Dm Type 2   Referral diagnosis: E11.8 Preferred learning style: No preference  (auditory, visual, hands on, no preference indicated) Learning readiness: Ready    NUTRITION ASSESSMENT Follow up DM Been keeping BS log. Has a Dexcom. Didn't wear it on vacation last week. TIR 27%, TAR 71% BS are still above target range. Dr. Fransico Him , Endocrinology increased insulin to 20 units from 15 units. 7 Day avg 181 mg/dl . A1C down 8.1% from 8.7%. Levemir 20 units a day; will switch to Lantus when her supply of Levermir is finished. Working on lifestyle changes with diet and exercise. Hyperlipidemia- LDL 152 mg/dl. Anthropometrics  Wt Readings from Last 3 Encounters:  12/20/22 188 lb 3.2 oz (85.4 kg)  12/15/22 185 lb 2 oz (84 kg)  10/24/22 190 lb (86.2 kg)   Ht Readings from Last 3 Encounters:  12/20/22 5' 6.5" (1.689 m)  12/15/22 5' 6.5" (1.689 m)  10/24/22 5\' 7"  (1.702 m)   There is no height or weight on file to calculate BMI. @BMIFA @ Facility age limit for growth %iles is 20 years. Facility age limit for growth %iles is 20 years.    Clinical Medical Hx: See chart Medications: See chart Labs:  Lab Results  Component Value Date   HGBA1C 8.7 (H) 09/19/2022      Latest Ref Rng & Units 09/19/2022   10:09 AM 03/10/2022    9:58 AM 11/30/2021    9:47 AM  CMP  Glucose 65 - 99 mg/dL 829  562  130   BUN 7 - 25 mg/dL 13  12  10    Creatinine 0.50 - 1.03 mg/dL 8.65  7.84  6.96   Sodium 135 - 146 mmol/L 137  139  141   Potassium 3.5 - 5.3 mmol/L 4.3  4.7  4.4   Chloride 98 - 110 mmol/L 103  103  106   CO2 20 - 32 mmol/L 26  27  25    Calcium 8.6 - 10.4 mg/dL 9.8  29.5  9.6   Total Protein 6.1 - 8.1 g/dL 7.4  7.7  7.1   Total Bilirubin 0.2 - 1.2 mg/dL 0.7  0.7  0.6   AST 10 - 35 U/L 17  17  15    ALT 6 - 29 U/L 19  18  16     Lipid Panel     Component Value Date/Time   CHOL 235  (H) 09/19/2022 1009   TRIG 74 09/19/2022 1009   HDL 65 09/19/2022 1009   CHOLHDL 3.6 09/19/2022 1009   VLDL 14 04/05/2016 1017   LDLCALC 152 (H) 09/19/2022 1009    Notable Signs/Symptoms: None  Lifestyle & Dietary Hx Lives by herself. Eating some out and some at home.  Estimated daily fluid intake: 80 oz Supplements: VIt D Sleep: 8 Stress / self-care: Stomach issues are causing a problem Current average weekly physical activity: Walking 2-3 times per week 2-3 miles at a time  24-Hr Dietary Recall First Meal: avacado toast and fruit,  Second Meal: 130 liver and onions, collard grreen, papaya smoothie and strawberries, water Snack:  Third Meal: 2 donut sticks Snack:  Beverages: water  Estimated Energy Needs Calories: 1200 Carbohydrate: 135g Protein: 90g Fat: 33g   NUTRITION DIAGNOSIS  NB-1.1 Food and nutrition-related knowledge deficit As related to Diabetes Type 2.  As evidenced by A1C 8.7%.   NUTRITION INTERVENTION  Nutrition education (E-1) on  the following topics:  Nutrition and Diabetes education provided on My Plate, CHO counting, meal planning, portion sizes, timing of meals, avoiding snacks between meals unless having a low blood sugar, target ranges for A1C and blood sugars, signs/symptoms and treatment of hyper/hypoglycemia, monitoring blood sugars, taking medications as prescribed, benefits of exercising 30 minutes per day and prevention of complications of DM.  Lifestyle Medicine  - Whole Food, Plant Predominant Nutrition is highly recommended: Eat Plenty of vegetables, Mushrooms, fruits, Legumes, Whole Grains, Nuts, seeds in lieu of processed meats, processed snacks/pastries red meat, poultry, eggs.    -It is better to avoid simple carbohydrates including: Cakes, Sweet Desserts, Ice Cream, Soda (diet and regular), Sweet Tea, Candies, Chips, Cookies, Store Bought Juices, Alcohol in Excess of  1-2 drinks a day, Lemonade,  Artificial Sweeteners, Doughnuts,  Coffee Creamers, "Sugar-free" Products, etc, etc.  This is not a complete list.....  Exercise: If you are able: 30 -60 minutes a day ,4 days a week, or 150 minutes a week.  The longer the better.  Combine stretch, strength, and aerobic activities.  If you were told in the past that you have high risk for cardiovascular diseases, you may seek evaluation by your heart doctor prior to initiating moderate to intense exercise programs.   Handouts Provided Include  Lifestyle Medicine Know your numbers. Using Dexcom  Learning Style & Readiness for Change Teaching method utilized: Visual & Auditory  Demonstrated degree of understanding via: Teach Back  Barriers to learning/adherence to lifestyle change: none  Goals Established by Pt Increase dark leafy greens 3 times per week. Get back to walking twice a week 30 minutes. Increase as tolerated 150 minutes per day Aim TIR reading to close 75%. Continue to take 20 units of Lantus/Levemir   MONITORING & EVALUATION Dietary intake, weekly physical activity, and blood sugars in 1 month.  Next Steps  Patient is to work on meal planning and meal prepping. Marland Kitchen

## 2023-01-01 ENCOUNTER — Encounter: Payer: Self-pay | Admitting: Nutrition

## 2023-01-15 ENCOUNTER — Ambulatory Visit (HOSPITAL_COMMUNITY)
Admission: RE | Admit: 2023-01-15 | Discharge: 2023-01-15 | Disposition: A | Discharge status: Home / Self Care | Payer: 59 | Source: Ambulatory Visit | Attending: Physician Assistant | Admitting: Physician Assistant

## 2023-01-15 DIAGNOSIS — R1013 Epigastric pain: Secondary | ICD-10-CM

## 2023-01-15 DIAGNOSIS — R112 Nausea with vomiting, unspecified: Secondary | ICD-10-CM

## 2023-01-15 DIAGNOSIS — R1011 Right upper quadrant pain: Secondary | ICD-10-CM | POA: Diagnosis not present

## 2023-01-15 DIAGNOSIS — R932 Abnormal findings on diagnostic imaging of liver and biliary tract: Secondary | ICD-10-CM | POA: Diagnosis not present

## 2023-01-17 ENCOUNTER — Other Ambulatory Visit: Payer: Self-pay | Admitting: "Endocrinology

## 2023-01-17 DIAGNOSIS — E1169 Type 2 diabetes mellitus with other specified complication: Secondary | ICD-10-CM

## 2023-01-18 ENCOUNTER — Ambulatory Visit: Payer: 59 | Admitting: Nurse Practitioner

## 2023-01-25 ENCOUNTER — Encounter: Payer: Self-pay | Admitting: Gastroenterology

## 2023-01-25 ENCOUNTER — Ambulatory Visit (AMBULATORY_SURGERY_CENTER): Payer: 59 | Admitting: Gastroenterology

## 2023-01-25 VITALS — BP 142/86 | HR 68 | Temp 98.0°F | Resp 12 | Ht 66.0 in | Wt 185.0 lb

## 2023-01-25 DIAGNOSIS — Z86010 Personal history of colonic polyps: Secondary | ICD-10-CM | POA: Diagnosis not present

## 2023-01-25 DIAGNOSIS — E119 Type 2 diabetes mellitus without complications: Secondary | ICD-10-CM | POA: Diagnosis not present

## 2023-01-25 DIAGNOSIS — K920 Hematemesis: Secondary | ICD-10-CM

## 2023-01-25 DIAGNOSIS — Z09 Encounter for follow-up examination after completed treatment for conditions other than malignant neoplasm: Secondary | ICD-10-CM

## 2023-01-25 DIAGNOSIS — K2970 Gastritis, unspecified, without bleeding: Secondary | ICD-10-CM | POA: Diagnosis not present

## 2023-01-25 DIAGNOSIS — D120 Benign neoplasm of cecum: Secondary | ICD-10-CM

## 2023-01-25 DIAGNOSIS — K2951 Unspecified chronic gastritis with bleeding: Secondary | ICD-10-CM

## 2023-01-25 DIAGNOSIS — I10 Essential (primary) hypertension: Secondary | ICD-10-CM | POA: Diagnosis not present

## 2023-01-25 DIAGNOSIS — K219 Gastro-esophageal reflux disease without esophagitis: Secondary | ICD-10-CM | POA: Diagnosis not present

## 2023-01-25 DIAGNOSIS — R1013 Epigastric pain: Secondary | ICD-10-CM | POA: Diagnosis not present

## 2023-01-25 DIAGNOSIS — K635 Polyp of colon: Secondary | ICD-10-CM

## 2023-01-25 MED ORDER — SODIUM CHLORIDE 0.9 % IV SOLN: 500.0000 mL | Freq: Once | INTRAVENOUS | Status: DC

## 2023-01-25 NOTE — Progress Notes (Signed)
Uneventful anesthetic. Report to pacu rn. Vss. Care resumed by rn. 

## 2023-01-25 NOTE — Op Note (Signed)
[facility] Patient Name: Kristin Hess Procedure Date: 01/25/2023 3:44 PM MRN: [id number] Endoscopist: Viviann Spare P. Adela Lank , MD, [id number] Age: 57 Referring MD:  Date of Birth: [birth year] Gender: Female Account #: [id number] Procedure:                Upper GI endoscopy Indications:              history of suspected gastro-esophageal reflux                            disease, history of one time episode of hematemesis                            in April (no recurrence), intermittent dyspepsia.                            Improved over time, managed with diet. Previously                            on nexium, now using TUMS as needed Medicines:                Monitored Anesthesia Care Procedure:                Pre-Anesthesia Assessment:                           - Prior to the procedure, a History and Physical                            was performed, and patient medications and                            allergies were reviewed. The patient's tolerance of                            previous anesthesia was also reviewed. The risks                            and benefits of the procedure and the sedation                            options and risks were discussed with the patient.                            All questions were answered, and informed consent                            was obtained. Prior Anticoagulants: The patient has                            taken no anticoagulant or antiplatelet agents. ASA                            Grade Assessment: II - A patient with mild systemic  disease. After reviewing the risks and benefits,                            the patient was deemed in satisfactory condition to                            undergo the procedure.                           After obtaining informed consent, the endoscope was                            passed under direct vision. Throughout the                            procedure, the  patient's blood pressure, pulse, and                            oxygen saturations were monitored continuously. The                            Olympus scope [id number] was introduced through the                            mouth, and advanced to the second part of duodenum.                            The upper GI endoscopy was accomplished without                            difficulty. The patient tolerated the procedure                            well. Scope In: Scope Out: Findings:                 Esophagogastric landmarks were identified: the                            Z-line was found at 38 cm, the gastroesophageal                            junction was found at 38 cm and the upper extent of                            the gastric folds was found at 38 cm from the                            incisors.                           The exam of the esophagus was otherwise normal.                           The entire examined stomach was  normal. Biopsies                            were taken with a cold forceps for Helicobacter                            pylori testing.                           The examined duodenum was normal. Complications:            No immediate complications. Estimated blood loss:                            Minimal. Estimated Blood Loss:     Estimated blood loss was minimal. Impression:               - Esophagogastric landmarks identified.                           - Normal esophagus otherwise.                           - Normal stomach. Biopsied.                           - Normal examined duodenum. Recommendation:           - Patient has a contact number available for                            emergencies. The signs and symptoms of potential                            delayed complications were discussed with the                            patient. Return to normal activities tomorrow.                            Written discharge instructions were provided to the                             patient.                           - Resume previous diet.                           - Continue present medications.                           - Continue TUMS as needed. If more frequent                            symptoms can use pepcid or PPI as needed                           -  Await pathology results. Viviann Spare P. Adela Lank, MD 01/25/2023 4:35:33 PM This report has been signed electronically.

## 2023-01-25 NOTE — Op Note (Signed)
[facility] Patient Name: Kristin Hess Procedure Date: 01/25/2023 3:43 PM MRN: [id number] Endoscopist: Viviann Spare P. Adela Lank , MD, [id number] Age: 57 Referring MD:  Date of Birth: 01-13-1966 Gender: Female Account #: [id number] Procedure:                Colonoscopy Indications:              High risk colon cancer surveillance: Personal                            history of colonic polyps - 5 adenomas removed                            09/2019, father with colon cancer dx age 21s Medicines:                Monitored Anesthesia Care Procedure:                Pre-Anesthesia Assessment:                           - Prior to the procedure, a History and Physical                            was performed, and patient medications and                            allergies were reviewed. The patient's tolerance of                            previous anesthesia was also reviewed. The risks                            and benefits of the procedure and the sedation                            options and risks were discussed with the patient.                            All questions were answered, and informed consent                            was obtained. Prior Anticoagulants: The patient has                            taken no anticoagulant or antiplatelet agents. ASA                            Grade Assessment: II - A patient with mild systemic                            disease. After reviewing the risks and benefits,                            the patient was deemed in satisfactory condition to  undergo the procedure.                           After obtaining informed consent, the colonoscope                            was passed under direct vision. Throughout the                            procedure, the patient's blood pressure, pulse, and                            oxygen saturations were monitored continuously. The                            [id number]  Colonoscope [id number] was introduced                            through the anus and advanced to the the cecum,                            identified by appendiceal orifice and ileocecal                            valve. The colonoscopy was performed without                            difficulty. The patient tolerated the procedure                            well. The quality of the bowel preparation was                            good. The ileocecal valve, appendiceal orifice, and                            rectum were photographed. Scope In: 4:03:24 PM Scope Out: 4:25:37 PM Scope Withdrawal Time: 0 hours 17 minutes 53 seconds  Total Procedure Duration: 0 hours 22 minutes 13 seconds  Findings:                 The perianal and digital rectal examinations were                            normal.                           A diminutive polyp was found in the cecum. The                            polyp was sessile. The polyp was removed with a                            cold snare. Resection and retrieval were complete.  The colon was tortuous with redundancy in the                            sigmoid colon.                           Internal hemorrhoids were found during                            retroflexion. The hemorrhoids were small.                           The exam was otherwise without abnormality. Complications:            No immediate complications. Estimated blood loss:                            Minimal. Estimated Blood Loss:     Estimated blood loss was minimal. Impression:               - One diminutive polyp in the cecum, removed with a                            cold snare. Resected and retrieved.                           - Tortuous colon.                           - Internal hemorrhoids.                           - The examination was otherwise normal. Recommendation:           - Patient has a contact number available for                             emergencies. The signs and symptoms of potential                            delayed complications were discussed with the                            patient. Return to normal activities tomorrow.                            Written discharge instructions were provided to the                            patient.                           - Resume previous diet.                           - Continue present medications.                           -  Await pathology results.                           - Repeat colonoscopy in 5 years for surveillance                            given history of adenomas and strong family history                            of colon cancer [name] P. Adela Lank, MD 01/25/2023 4:31:12 PM This report has been signed electronically.

## 2023-01-25 NOTE — Patient Instructions (Addendum)
Handouts on hemorrhoids and polyps given to patient Await pathology results  Resume previous diet and continue present medications - continue TUMS as needed. If more frequent symptoms can use Pepcid or PPI as needed  Repeat colonoscopy in 5 years for surveillance    YOU HAD AN ENDOSCOPIC PROCEDURE TODAY AT THE [facility]:   Refer to the procedure report that was given to you for any specific questions about what was found during the examination.  If the procedure report does not answer your questions, please call your gastroenterologist to clarify.  If you requested that your care partner not be given the details of your procedure findings, then the procedure report has been included in a sealed envelope for you to review at your convenience later.  YOU SHOULD EXPECT: Some feelings of bloating in the abdomen. Passage of more gas than usual.  Walking can help get rid of the air that was put into your GI tract during the procedure and reduce the bloating. If you had a lower endoscopy (such as a colonoscopy or flexible sigmoidoscopy) you may notice spotting of blood in your stool or on the toilet paper. If you underwent a bowel prep for your procedure, you may not have a normal bowel movement for a few days.  Please Note:  You might notice some irritation and congestion in your nose or some drainage.  This is from the oxygen used during your procedure.  There is no need for concern and it should clear up in a day or so.  SYMPTOMS TO REPORT IMMEDIATELY:  Following lower endoscopy (colonoscopy or flexible sigmoidoscopy):  Excessive amounts of blood in the stool  Significant tenderness or worsening of abdominal pains  Swelling of the abdomen that is new, acute  Fever of 100F or higher  Following upper endoscopy (EGD)  Vomiting of blood or coffee ground material  New chest pain or pain under the shoulder blades  Painful or persistently difficult swallowing  New shortness of  breath  Fever of 100F or higher  Black, tarry-looking stools  For urgent or emergent issues, a gastroenterologist can be reached at any hour by calling [phone]. Do not use MyChart messaging for urgent concerns.    DIET:  We do recommend a small meal at first, but then you may proceed to your regular diet.  Drink plenty of fluids but you should avoid alcoholic beverages for 24 hours.  ACTIVITY:  You should plan to take it easy for the rest of today and you should NOT DRIVE or use heavy machinery until tomorrow (because of the sedation medicines used during the test).    FOLLOW UP: Our staff will call the number listed on your records the next business day following your procedure.  We will call around 7:15- 8:00 am to check on you and address any questions or concerns that you may have regarding the information given to you following your procedure. If we do not reach you, we will leave a message.     If any biopsies were taken you will be contacted by phone or by letter within the next 1-3 weeks.  Please call us at [phone] if you have not heard about the biopsies in 3 weeks.    SIGNATURES/CONFIDENTIALITY: You and/or your care partner have signed paperwork which will be entered into your electronic medical record.  These signatures attest to the fact that that the information above on your After Visit Summary has been reviewed and is understood.  Full responsibility of the confidentiality of this discharge information lies with you and/or your care-partner.

## 2023-01-25 NOTE — Progress Notes (Signed)
Called to room to assist during endoscopic procedure.  Patient ID and intended procedure confirmed with present staff. Received instructions for my participation in the procedure from the performing physician.  

## 2023-01-25 NOTE — Progress Notes (Signed)
[facility] History and Physical   Primary Care Physician:  Raynelle Dick, NP   Reason for Procedure:   History of dyspepsia, GERD, one episode of hematemesis, history of colon polyps  Plan:    EGD and colonoscopy     HPI: Kristin Hess is a 57 y.o. female  here for EGD and colonoscopy - see clinic visit 12/15/22 for details.  HIstory of GERD, dyspepsia with epigastric pain, had an episode of hematemesis, EGD To further evaluate. Currently does not appear to be on any antacids, was on nexium. Changed diet and this has helped her since that time of symptoms (April). Using TUMS PRN now for GERD which still bothers her intermittently. Still has some occasional epigastric pain.   Colonoscopy 09/2019 - 5 adenomas. Father had CRC dx age 21, here for surveillance colonoscopy.    Otherwise feels well without any cardiopulmonary symptoms.   I have discussed risks / benefits of anesthesia and endoscopic procedure with Roel Cluck and they wish to proceed with the exams as outlined today.    Past Medical History:  Diagnosis Date   Adrenal adenoma    Allergy    Anemia    Diabetes mellitus without complication (HCC)    Endometriosis of rectovaginal septum    GERD (gastroesophageal reflux disease)    Headache    MIGRAINES   Hyperaldosteronism (HCC)    Possibly   Hyperlipidemia    Hypertension    Hypokalemia    Ovarian cyst    Raynaud disease    hands and feet     Past Surgical History:  Procedure Laterality Date   CARDIAC CATHETERIZATION     CESAREAN SECTION     X 2   ROBOTIC ASSISTED TOTAL HYSTERECTOMY WITH BILATERAL SALPINGO OOPHERECTOMY Bilateral 06/16/2014   Procedure: ROBOTIC ASSISTED TOTAL HYSTERECTOMY WITH BILATERAL SALPINGO OOPHORECTOMY, LYSIS OF ADHESIONS;  Surgeon: Adolphus Birchwood, MD;  Location: [facility];  Service: Gynecology;  Laterality: Bilateral;   TUBAL LIGATION      Prior to Admission medications   Medication Sig Start Date End Date  Taking? Authorizing Provider  Continuous Glucose Receiver (DEXCOM G7 RECEIVER) DEVI 1 receiver 10/24/22  Yes Raynelle Dick, NP  Continuous Glucose Sensor (DEXCOM G7 SENSOR) MISC Apply new sensor every 10 days 10/24/22  Yes Raynelle Dick, NP  insulin glargine (LANTUS SOLOSTAR) 100 UNIT/ML Solostar Pen INJECT 20 UNITS INTO THE SKIN AT BEDTIME (ACTUALLY 75 DAY SUPPLY) 01/17/23  Yes Nida, Denman George, MD  Insulin Pen Needle (BD PEN NEEDLE MICRO U/F) 32G X 6 MM MISC USE ONCE DAILY 03/10/22  Yes Raynelle Dick, NP  spironolactone (ALDACTONE) 50 MG tablet Take 50mg  in am and 50mg  in pm Patient taking differently: Take 50 mg by mouth daily. 05/10/22  Yes Raynelle Dick, NP  Vitamin D, Ergocalciferol, (DRISDOL) 1.25 MG (50000 UNIT) CAPS capsule Take 1 capsule (50,000 Units total) by mouth every 7 (seven) days. 05/11/22  Yes Raynelle Dick, NP  Blood Glucose Monitoring Suppl (ONE TOUCH ULTRA MINI) w/Device KIT See admin instructions. for testing 09/01/15   [provider]  glucose blood test strip Use as instructed 05/19/21   Judd Gaudier, NP  Magnesium 250 MG TABS Take by mouth. Patient not taking: Reported on 01/25/2023    [provider]  metFORMIN (GLUCOPHAGE-XR) 500 MG 24 hr tablet TAKE 1 TABLET BY MOUTH TWICE A DAY WITH MEAL AS TOLERATED, FOR DIABETES Patient not taking: Reported on 01/25/2023 05/10/22   Anda Kraft  E, NP    Current Outpatient Medications  Medication Sig Dispense Refill   Continuous Glucose Receiver (DEXCOM G7 RECEIVER) DEVI 1 receiver 1 each 0   Continuous Glucose Sensor (DEXCOM G7 SENSOR) MISC Apply new sensor every 10 days 3 each 3   insulin glargine (LANTUS SOLOSTAR) 100 UNIT/ML Solostar Pen INJECT 20 UNITS INTO THE SKIN AT BEDTIME (ACTUALLY 75 DAY SUPPLY) 30 mL 0   Insulin Pen Needle (BD PEN NEEDLE MICRO U/F) 32G X 6 MM MISC USE ONCE DAILY 100 each 2   spironolactone (ALDACTONE) 50 MG tablet Take 50mg  in am and 50mg  in pm (Patient taking  differently: Take 50 mg by mouth daily.) 270 tablet 3   Vitamin D, Ergocalciferol, (DRISDOL) 1.25 MG (50000 UNIT) CAPS capsule Take 1 capsule (50,000 Units total) by mouth every 7 (seven) days. 12 capsule 7   Blood Glucose Monitoring Suppl (ONE TOUCH ULTRA MINI) w/Device KIT See admin instructions. for testing  0   glucose blood test strip Use as instructed 100 each 12   Magnesium 250 MG TABS Take by mouth. (Patient not taking: Reported on 01/25/2023)     metFORMIN (GLUCOPHAGE-XR) 500 MG 24 hr tablet TAKE 1 TABLET BY MOUTH TWICE A DAY WITH MEAL AS TOLERATED, FOR DIABETES (Patient not taking: Reported on 01/25/2023) 180 tablet PRN   Current Facility-Administered Medications  Medication Dose Route Frequency Provider Last Rate Last Admin   0.9 %  sodium chloride infusion  500 mL Intravenous Once Novah Goza, Willaim Rayas, MD        Allergies as of 01/25/2023 - Review Complete 01/25/2023  Allergen Reaction Noted   Ace inhibitors Cough 08/20/2013   Hctz [hydrochlorothiazide] Cough 08/20/2013    Family History  Problem Relation Age of Onset   Stroke Mother    Diabetes Mother    Lung cancer Mother    Diabetes Father    Hepatitis C Father    Colon cancer Father 39   Pancreatic cancer Father    Stroke Brother    Hypertension Brother    Scleroderma Maternal Grandmother    Stroke Maternal Grandfather    Lupus Maternal Aunt    Lupus Paternal Aunt    Breast cancer Neg Hx    Esophageal cancer Neg Hx    Stomach cancer Neg Hx     Social History   Socioeconomic History   Marital status: Divorced    Spouse name: Richard   Number of children: 2   Years of education: 14   Highest education level: Not on file  Occupational History   Occupation: Works from home at [employer]  Tobacco Use   Smoking status: Never   Smokeless tobacco: Never  Vaping Use   Vaping status: Never Used  Substance and Sexual Activity   Alcohol use: Not Currently    Comment: occasional   Drug use: No   Sexual activity:  Not Currently  Other Topics Concern   Not on file  Social History Narrative   Lives at home w/ youngest daughter   Right-handed   Drinks about 16-32 oz of caffeinated beverages per day   Social Determinants of Health   Financial Resource Strain: Not on file  Food Insecurity: Not on file  Transportation Needs: Not on file  Physical Activity: Not on file  Stress: Not on file  Social Connections: Not on file  Intimate Partner Violence: Not on file    Review of Systems: All other review of systems negative except as mentioned in the HPI.  Physical  Exam: Vital signs BP 131/81   Pulse 75   Temp 98 F (36.7 C)   Ht 5\' 6"  (1.676 m)   Wt 185 lb (83.9 kg)   LMP 05/16/2014   SpO2 99%   BMI 29.86 kg/m   General:   Alert,  Well-developed, pleasant and cooperative in NAD Lungs:  Clear throughout to auscultation.   Heart:  Regular rate and rhythm Abdomen:  Soft, nontender and nondistended.   Neuro/Psych:  Alert and cooperative. Normal mood and affect. A and O x 3  Harlin Rain, MD [facility]

## 2023-01-26 ENCOUNTER — Telehealth: Payer: Self-pay

## 2023-01-26 NOTE — Telephone Encounter (Signed)
Left message on answering machine. 

## 2023-02-28 ENCOUNTER — Encounter: Payer: Self-pay | Admitting: Nutrition

## 2023-02-28 NOTE — Patient Instructions (Signed)
Goals  Eat three meals per day Focus on more whole plant based foods Cut out processed foods, fast foods and junk food Drink only water Exercise 30 minutes daily. Lose 1 lb per week. Get A1C down to 7% Take Metformin after breakfast, not before. Test blood sugars twice a day; before breakfast and bedtime.

## 2023-03-26 ENCOUNTER — Ambulatory Visit: Payer: 59 | Admitting: "Endocrinology

## 2023-03-26 ENCOUNTER — Ambulatory Visit: Payer: 59 | Admitting: Nutrition

## 2023-03-30 ENCOUNTER — Other Ambulatory Visit: Payer: Self-pay | Admitting: Nurse Practitioner

## 2023-03-30 DIAGNOSIS — E119 Type 2 diabetes mellitus without complications: Secondary | ICD-10-CM

## 2023-05-04 ENCOUNTER — Encounter: Payer: Self-pay | Admitting: Nurse Practitioner

## 2023-05-07 ENCOUNTER — Other Ambulatory Visit: Payer: Self-pay | Admitting: Nurse Practitioner

## 2023-05-07 DIAGNOSIS — K047 Periapical abscess without sinus: Secondary | ICD-10-CM

## 2023-05-07 MED ORDER — AMOXICILLIN 500 MG PO TABS
500.0000 mg | ORAL_TABLET | Freq: Three times a day (TID) | ORAL | 0 refills | Status: AC
Start: 2023-05-07 — End: ?

## 2023-05-07 NOTE — Progress Notes (Signed)
Pt has a tooth abscess and requested antibiotic

## 2023-05-13 ENCOUNTER — Other Ambulatory Visit: Payer: Self-pay | Admitting: Nurse Practitioner

## 2023-05-13 DIAGNOSIS — I10 Essential (primary) hypertension: Secondary | ICD-10-CM

## 2023-05-13 DIAGNOSIS — E1169 Type 2 diabetes mellitus with other specified complication: Secondary | ICD-10-CM

## 2023-07-23 ENCOUNTER — Ambulatory Visit: Payer: 59 | Admitting: Nutrition

## 2023-07-23 ENCOUNTER — Ambulatory Visit: Payer: 59 | Admitting: "Endocrinology

## 2023-07-24 ENCOUNTER — Other Ambulatory Visit: Payer: Self-pay | Admitting: "Endocrinology

## 2023-07-24 DIAGNOSIS — E785 Hyperlipidemia, unspecified: Secondary | ICD-10-CM

## 2023-07-26 ENCOUNTER — Other Ambulatory Visit: Payer: Self-pay

## 2023-07-26 DIAGNOSIS — E119 Type 2 diabetes mellitus without complications: Secondary | ICD-10-CM

## 2023-07-26 MED ORDER — DEXCOM G7 SENSOR MISC: 3 refills | Status: DC

## 2023-07-31 ENCOUNTER — Other Ambulatory Visit: Payer: Self-pay | Admitting: "Endocrinology

## 2023-07-31 DIAGNOSIS — E785 Hyperlipidemia, unspecified: Secondary | ICD-10-CM

## 2023-09-18 ENCOUNTER — Other Ambulatory Visit: Payer: Self-pay

## 2023-09-18 DIAGNOSIS — E1169 Type 2 diabetes mellitus with other specified complication: Secondary | ICD-10-CM

## 2023-09-18 DIAGNOSIS — E782 Mixed hyperlipidemia: Secondary | ICD-10-CM

## 2023-09-19 ENCOUNTER — Encounter: Payer: 59 | Admitting: Nurse Practitioner

## 2023-10-06 DIAGNOSIS — Z111 Encounter for screening for respiratory tuberculosis: Secondary | ICD-10-CM | POA: Diagnosis not present

## 2023-10-19 DIAGNOSIS — E1169 Type 2 diabetes mellitus with other specified complication: Secondary | ICD-10-CM | POA: Diagnosis not present

## 2023-10-19 DIAGNOSIS — E785 Hyperlipidemia, unspecified: Secondary | ICD-10-CM | POA: Diagnosis not present

## 2023-10-19 DIAGNOSIS — E782 Mixed hyperlipidemia: Secondary | ICD-10-CM | POA: Diagnosis not present

## 2023-10-23 ENCOUNTER — Encounter: Payer: Self-pay | Admitting: "Endocrinology

## 2023-10-23 ENCOUNTER — Ambulatory Visit (INDEPENDENT_AMBULATORY_CARE_PROVIDER_SITE_OTHER): Payer: 59 | Admitting: "Endocrinology

## 2023-10-23 VITALS — BP 130/82 | HR 84 | Ht 66.5 in | Wt 189.2 lb

## 2023-10-23 DIAGNOSIS — E785 Hyperlipidemia, unspecified: Secondary | ICD-10-CM

## 2023-10-23 DIAGNOSIS — I10 Essential (primary) hypertension: Secondary | ICD-10-CM

## 2023-10-23 DIAGNOSIS — E1169 Type 2 diabetes mellitus with other specified complication: Secondary | ICD-10-CM | POA: Diagnosis not present

## 2023-10-23 DIAGNOSIS — Z794 Long term (current) use of insulin: Secondary | ICD-10-CM | POA: Diagnosis not present

## 2023-10-23 DIAGNOSIS — E782 Mixed hyperlipidemia: Secondary | ICD-10-CM

## 2023-10-23 LAB — POCT GLYCOSYLATED HEMOGLOBIN (HGB A1C): HbA1c, POC (controlled diabetic range): 8.1 % — AB (ref 0.0–7.0)

## 2023-10-23 LAB — T4, FREE: Free T4: 1.01 ng/dL (ref 0.82–1.77)

## 2023-10-23 LAB — ALDOSTERONE + RENIN ACTIVITY W/ RATIO
Aldos/Renin Ratio: 189.6 — ABNORMAL HIGH (ref 0.0–30.0)
Aldosterone: 34.5 ng/dL — ABNORMAL HIGH (ref 0.0–30.0)
Renin Activity, Plasma: 0.182 ng/mL/h (ref 0.167–5.380)

## 2023-10-23 LAB — LIPID PANEL
Chol/HDL Ratio: 3.8 ratio (ref 0.0–4.4)
Cholesterol, Total: 230 mg/dL — ABNORMAL HIGH (ref 100–199)
HDL: 60 mg/dL (ref >39)
LDL Chol Calc (NIH): 156 mg/dL — ABNORMAL HIGH (ref 0–99)
Triglycerides: 78 mg/dL (ref 0–149)
VLDL Cholesterol Cal: 14 mg/dL (ref 5–40)

## 2023-10-23 LAB — COMPREHENSIVE METABOLIC PANEL WITH GFR
ALT: 15 IU/L (ref 0–32)
AST: 19 IU/L (ref 0–40)
Albumin: 4.8 g/dL (ref 3.8–4.9)
Alkaline Phosphatase: 82 IU/L (ref 44–121)
BUN/Creatinine Ratio: 13 (ref 9–23)
BUN: 11 mg/dL (ref 6–24)
Bilirubin Total: 0.6 mg/dL (ref 0.0–1.2)
CO2: 23 mmol/L (ref 20–29)
Calcium: 9.8 mg/dL (ref 8.7–10.2)
Chloride: 101 mmol/L (ref 96–106)
Creatinine, Ser: 0.82 mg/dL (ref 0.57–1.00)
Globulin, Total: 2.4 g/dL (ref 1.5–4.5)
Glucose: 141 mg/dL — ABNORMAL HIGH (ref 70–99)
Potassium: 4.6 mmol/L (ref 3.5–5.2)
Sodium: 138 mmol/L (ref 134–144)
Total Protein: 7.2 g/dL (ref 6.0–8.5)
eGFR: 83 mL/min/{1.73_m2} (ref >59)

## 2023-10-23 LAB — TSH: TSH: 2.42 u[IU]/mL (ref 0.450–4.500)

## 2023-10-23 MED ORDER — METFORMIN HCL ER 500 MG PO TB24: 500.0000 mg | ORAL_TABLET | Freq: Every day | ORAL | 1 refills | Status: DC

## 2023-10-23 MED ORDER — LANTUS SOLOSTAR 100 UNIT/ML ~~LOC~~ SOPN: 20.0000 [IU] | PEN_INJECTOR | Freq: Every day | SUBCUTANEOUS | 1 refills | Status: AC

## 2023-10-23 NOTE — Patient Instructions (Signed)

## 2023-10-23 NOTE — Progress Notes (Signed)
 10/23/2023, 12:59 PM  Endocrinology follow-up note   Subjective:    Patient ID: Kristin Hess, female    DOB: 1966/03/08.  Kristin Hess is being seen in follow-up after she was seen in consultation for management of currently uncontrolled symptomatic diabetes requested by  Wilkinson, Dana E, FNP.   Past Medical History:  Diagnosis Date   Adrenal adenoma    Allergy    Anemia    Diabetes mellitus without complication (HCC)    Endometriosis of rectovaginal septum    GERD (gastroesophageal reflux disease)    Headache    MIGRAINES   Hyperaldosteronism (HCC)    Possibly   Hyperlipidemia    Hypertension    Hypokalemia    Ovarian cyst    Raynaud disease    hands and feet     Past Surgical History:  Procedure Laterality Date   CARDIAC CATHETERIZATION     CESAREAN SECTION     X 2   ROBOTIC ASSISTED TOTAL HYSTERECTOMY WITH BILATERAL SALPINGO OOPHERECTOMY Bilateral 06/16/2014   Procedure: ROBOTIC ASSISTED TOTAL HYSTERECTOMY WITH BILATERAL SALPINGO OOPHORECTOMY, LYSIS OF ADHESIONS;  Surgeon: Alphonso Aschoff, MD;  Location: [facility];  Service: Gynecology;  Laterality: Bilateral;   TUBAL LIGATION      Social History   Socioeconomic History   Marital status: Divorced    Spouse name: Richard   Number of children: 2   Years of education: 14   Highest education level: Not on file  Occupational History   Occupation: Works from home at [employer]  Tobacco Use   Smoking status: Never   Smokeless tobacco: Never  Vaping Use   Vaping status: Never Used  Substance and Sexual Activity   Alcohol use: Not Currently    Comment: occasional   Drug use: No   Sexual activity: Not Currently  Other Topics Concern   Not on file  Social History Narrative   Lives at home w/ youngest daughter   Right-handed   Drinks about 16-32 oz of caffeinated beverages per day   Social Drivers of Manufacturing engineer Strain: Not on file  Food Insecurity: Not on file  Transportation Needs: Not on file  Physical Activity: Not on file  Stress: Not on file  Social Connections: Not on file    Family History  Problem Relation Age of Onset   Stroke Mother    Diabetes Mother    Lung cancer Mother    Diabetes Father    Hepatitis C Father    Colon cancer Father 48   Pancreatic cancer Father    Stroke Brother    Hypertension Brother    Scleroderma Maternal Grandmother    Stroke Maternal Grandfather    Lupus Maternal Aunt    Lupus Paternal Aunt    Breast cancer Neg Hx    Esophageal cancer Neg Hx    Stomach cancer Neg Hx     Outpatient Encounter Medications as of 10/23/2023  Medication Sig   amoxicillin  (AMOXIL ) 500 MG tablet Take 1 tablet (500 mg total) by mouth 3 (three) times daily. 10 days   BD  PEN NEEDLE MICRO U/F 32G X 6 MM MISC USE ONCE DAILY AS DIRECTED   Blood Glucose Monitoring Suppl (ONE TOUCH ULTRA MINI) w/Device KIT See admin instructions. for testing   Continuous Glucose Receiver (DEXCOM G7 RECEIVER) DEVI 1 receiver   Continuous Glucose Sensor (DEXCOM G7 SENSOR) MISC Apply new sensor every 10 days   glucose blood test strip Use as instructed   insulin  glargine (LANTUS  SOLOSTAR) 100 UNIT/ML Solostar Pen Inject 20 Units into the skin at bedtime.   Magnesium  250 MG TABS Take by mouth. (Patient not taking: Reported on 01/25/2023)   metFORMIN  (GLUCOPHAGE -XR) 500 MG 24 hr tablet Take 1 tablet (500 mg total) by mouth daily with breakfast.   spironolactone  (ALDACTONE ) 50 MG tablet Take 50mg  in am and 50mg  in pm (Patient taking differently: Take 50 mg by mouth daily. Take 50mg  in am and 50mg  in pm)   Vitamin D , Ergocalciferol , (DRISDOL ) 1.25 MG (50000 UNIT) CAPS capsule Take 1 capsule (50,000 Units total) by mouth every 7 (seven) days.   [DISCONTINUED] insulin  glargine (LANTUS  SOLOSTAR) 100 UNIT/ML Solostar Pen INJECT 20 UNITS INTO THE SKIN AT BEDTIME (ACTUALLY 75 DAY SUPPLY)    [DISCONTINUED] metFORMIN  (GLUCOPHAGE -XR) 500 MG 24 hr tablet TAKE 1 TABLET BY MOUTH TWICE A DAY WITH MEAL AS TOLERATED, FOR DIABETES (Patient taking differently: Take 500 mg by mouth daily with breakfast. TAKE 1 TABLET BY MOUTH TWICE A DAY WITH MEAL AS TOLERATED, FOR DIABETES)   No facility-administered encounter medications on file as of 10/23/2023.    ALLERGIES: Allergies  Allergen Reactions   Ace Inhibitors Cough   Hctz [Hydrochlorothiazide] Cough    VACCINATION STATUS: Immunization History  Administered Date(s) Administered   Hepatitis B, PED/ADOLESCENT 07/30/2022, 08/31/2022   Influenza-Unspecified 07/29/2022   Moderna Sars-Covid-2 Vaccination 06/11/2020, 07/09/2020   Td 06/06/2007, 07/29/2022    Diabetes She presents for her follow-up diabetic visit. She has type 2 diabetes mellitus. Onset time: She was diagnosed at approximate age of 44 years. Her disease course has been fluctuating. There are no hypoglycemic associated symptoms. Pertinent negatives for hypoglycemia include no confusion, headaches, pallor or seizures. Associated symptoms include fatigue, polydipsia and polyuria. Pertinent negatives for diabetes include no chest pain and no polyphagia. There are no hypoglycemic complications. Symptoms are worsening. There are no diabetic complications. Risk factors for coronary artery disease include diabetes mellitus, dyslipidemia, family history and hypertension. Current diabetic treatments: She is on Levemir  15 units nightly and metformin  500 mg p.o. once a day. Her weight is fluctuating minimally. She is following a generally unhealthy diet. She has had a previous visit with a dietitian. She participates in exercise intermittently. Her home blood glucose trend is fluctuating minimally. Her breakfast blood glucose range is generally 140-180 mg/dl. Her lunch blood glucose range is generally 140-180 mg/dl. Her dinner blood glucose range is generally 140-180 mg/dl. Her bedtime blood  glucose range is generally 140-180 mg/dl. Her overall blood glucose range is 140-180 mg/dl. (She did not stay on prescribed treatment.  She recently received the Dexcom CGM showing average blood glucose of 193 mg per DL.  She has not taking any insulin  for the last 4 weeks.  She is showing 48% time in range, 40% level 1 hyperglycemia, 12% level 2 hyperglycemia.  She has no hypoglycemia.  Admittedly, she has been taking only metformin  500 mg p.o. once a day.  Her point-of-care A1c is 8.1% unchanged from her last visit A1c. ) An ACE inhibitor/angiotensin II receptor blocker is contraindicated (ACE inhibitor's and hydrochlorothiazide are  listed as allergies.).  Hyperlipidemia This is a chronic problem. The current episode started more than 1 year ago. Exacerbating diseases include diabetes. Pertinent negatives include no chest pain, myalgias or shortness of breath. She is currently on no antihyperlipidemic treatment. Risk factors for coronary artery disease include diabetes mellitus, dyslipidemia, hypertension, post-menopausal and family history.  Hypertension This is a chronic problem. The problem is controlled. Pertinent negatives include no chest pain, headaches, palpitations or shortness of breath. Risk factors for coronary artery disease include dyslipidemia, diabetes mellitus and post-menopausal state. Treatments tried: Spironolactone  50 mg once a day.       Objective:       10/23/2023   10:50 AM 01/25/2023    4:47 PM 01/25/2023    4:37 PM  Vitals with BMI  Height 5' 6.5"    Weight 189 lbs 3 oz    BMI 30.08    Systolic 130 142 409  Diastolic 82 86 93  Pulse 84 68 78    BP 130/82   Pulse 84   Ht 5' 6.5" (1.689 m)   Wt 189 lb 3.2 oz (85.8 kg)   LMP 05/16/2014   BMI 30.08 kg/m   Wt Readings from Last 3 Encounters:  10/23/23 189 lb 3.2 oz (85.8 kg)  01/25/23 185 lb (83.9 kg)  12/20/22 188 lb 3.2 oz (85.4 kg)      CMP ( most recent) CMP     Component Value Date/Time   NA 138  10/19/2023 1100   K 4.6 10/19/2023 1100   CL 101 10/19/2023 1100   CO2 23 10/19/2023 1100   GLUCOSE 141 (H) 10/19/2023 1100   GLUCOSE 199 (H) 09/19/2022 1009   BUN 11 10/19/2023 1100   CREATININE 0.82 10/19/2023 1100   CREATININE 0.76 09/19/2022 1009   CALCIUM  9.8 10/19/2023 1100   PROT 7.2 10/19/2023 1100   ALBUMIN 4.8 10/19/2023 1100   AST 19 10/19/2023 1100   ALT 15 10/19/2023 1100   ALKPHOS 82 10/19/2023 1100   BILITOT 0.6 10/19/2023 1100   EGFR 83 10/19/2023 1100   GFRNONAA 93 08/26/2020 1600   Diabetic Labs (most recent): Lab Results  Component Value Date   HGBA1C 8.1 (A) 10/23/2023   HGBA1C 8.7 (H) 09/19/2022   HGBA1C 8.0 (H) 03/10/2022   MICROALBUR <0.2 09/19/2022   MICROALBUR 0.8 08/29/2021   MICROALBUR 0.7 07/07/2019     Lipid Panel ( most recent) Lipid Panel     Component Value Date/Time   CHOL 230 (H) 10/19/2023 1100   TRIG 78 10/19/2023 1100   HDL 60 10/19/2023 1100   CHOLHDL 3.8 10/19/2023 1100   CHOLHDL 3.6 09/19/2022 1009   VLDL 14 04/05/2016 1017   LDLCALC 156 (H) 10/19/2023 1100   LDLCALC 152 (H) 09/19/2022 1009   LABVLDL 14 10/19/2023 1100      Lab Results  Component Value Date   TSH 2.420 10/19/2023   TSH 1.89 09/19/2022   TSH 2.68 03/10/2022   TSH 1.75 08/29/2021   TSH 1.22 05/12/2021   TSH 1.68 08/26/2020   TSH 2.18 01/22/2020   TSH 1.93 10/20/2019   TSH 2.37 07/07/2019   TSH 1.40 01/06/2019   FREET4 1.01 10/19/2023       Assessment & Plan:   1. Type 2 diabetes mellitus with hyperlipidemia (HCC) 2. Mixed hyperlipidemia  - Nyjah Ramon Hengel has currently uncontrolled symptomatic type 2 DM since  58 years of age. She did not stay on prescribed treatment.  She recently received the Dexcom CGM  showing average blood glucose of 193 mg per DL.  She has not taking any insulin  for the last 4 weeks.  She is showing 48% time in range, 40% level 1 hyperglycemia, 12% level 2 hyperglycemia.  She has no hypoglycemia.  Admittedly, she has  been taking only metformin  500 mg p.o. once a day.  Her point-of-care A1c is 8.1% unchanged from her last visit A1c. Recent labs reviewed. - I had a long discussion with her about the possible risk factors and  the pathology behind its diabetes and its complications. -her diabetes is complicated by comorbid hyperlipidemia, hypertension and she remains at a high risk for more acute and chronic complications which include CAD, CVA, CKD, retinopathy, and neuropathy. These are all discussed in detail with her.  - I discussed all available options of managing her diabetes including de-escalation of medications. I have counseled her on Food as Medicine by adopting a Whole Food , Plant Predominant  ( WFPP) nutrition as recommended by [employer] of Lifestyle Medicine. Patient is encouraged to switch to  unprocessed or minimally processed  complex starch, adequate protein intake (mainly plant source), minimal liquid fat, plenty of fruits, and vegetables. -  she is advised to stick to a routine mealtimes to eat 3 complete meals a day and snack only when necessary ( to snack only to correct hypoglycemia BG <70 day time or <100 at night).   - she would like to avoid escalation of medications for treatment of diabetes, hyperlipidemia, and she acknowledges that there is a room for improvement in her food and drink choices.  - Suggestion is made for her to avoid simple carbohydrates  from her diet including Cakes, Sweet Desserts, Ice Cream, Soda (diet and regular), Sweet Tea, Candies, Chips, Cookies, Store Bought Juices, Alcohol , Artificial Sweeteners,  Coffee Creamer, and "Sugar-free" Products, Lemonade. This will help patient to have more stable blood glucose profile and potentially avoid unintended weight gain.  The following Lifestyle Medicine recommendations according to American College of Lifestyle Medicine  [facility]) were discussed and and offered to patient and she  agrees to start the journey:  A. Whole  Foods, Plant-Based Nutrition comprising of fruits and vegetables, plant-based proteins, whole-grain carbohydrates was discussed in detail with the patient.   A list for source of those nutrients were also provided to the patient.  Patient will use only water  or unsweetened tea for hydration. B.  The need to stay away from risky substances including alcohol, smoking; obtaining 7 to 9 hours of restorative sleep, at least 150 minutes of moderate intensity exercise weekly, the importance of healthy social connections,  and stress management techniques were discussed. C.  A full color page of  Calorie density of various food groups per pound showing examples of each food groups was provided to the patient.  - she will be scheduled with Penny Crumpton, RDN, CDE for individualized diabetes education.  - I have approached her with the following individualized plan to manage  her diabetes and patient agrees:   - In light of her suboptimal engagement with lifestyle recommendations, she will continue to need medications in order for her to achieve control of diabetes to target.  She was offered either a GLP-1 receptor agonist or SGLT2 inhibitor, however she prefers to stay on her insulin .   Accordingly, I advised her to stay on Lantus  20 units nightly, and metformin  500 mg p.o. daily at breakfast.   - she is warned not to take insulin  without proper  monitoring per orders.  - she is encouraged to call clinic for blood glucose levels less than 70 or above 180 mg per DL weekly average.    -Patient is motivated for deprescription.  She has a chance depending on her engagement with lifestyle medicine. If she returns with worsening glycemic profile, she will be reconsidered for either GLP-1 receptor agonist or SGLT2 inhibitor.  - Specific targets for  A1c;  LDL, HDL,  and Triglycerides were discussed with the patient.  2) Blood Pressure /Hypertension:  Her blood pressure is controlled to target.  ACE inhibitor's  and hydrochlorothiazide are listed as allergies.  She is advised to continue spironolactone  50 mg once a day.  As she wishes, she will be considered for deprescription on subsequent visits.    3) Lipids/Hyperlipidemia:   Review of her recent lipid panel showed uncontrolled lipid panel with LDL at 156.  She was approached with statin prescription, however she declines this option at this time.    She will have repeat fasting lipid panel before her next visit in 3 months.  If her LDL remains above 100 mg per DL, she will be approached with  low-dose statins.  Complications of uncontrolled DM/dyslipidemia discussed with her.  4)  Weight/Diet:  Body mass index is 30.08 kg/m.  -   clearly complicating her diabetes care.   she is  a candidate for weight loss. I discussed with her the fact that loss of 5 - 10% of her  current body weight will have the most impact on her diabetes management.  The above detailed  ACLM recommendations for nutrition, exercise, sleep, social life, avoidance of risky substances, the need for restorative sleep   information will also detailed on discharge instructions.  5) Chronic Care/Health Maintenance:  -she  is not on  ACEI/ARB and Statin medications and  is encouraged to initiate and continue to follow up with Ophthalmology, Dentist,  Podiatrist at least yearly or according to recommendations, and advised to   stay away from smoking. I have recommended yearly flu vaccine and pneumonia vaccine at least every 5 years; moderate intensity exercise for up to 150 minutes weekly; and  sleep for 7- 9 hours a day.  - she is  advised to maintain close follow up with Wilkinson, Dana E, FNP for primary care needs, as well as her other providers for optimal and coordinated care.   I spent  30  minutes in the care of the patient today including review of labs from CMP, Lipids, Thyroid  Function, Hematology (current and previous including abstractions from other facilities); face-to-face  time discussing  her blood glucose readings/logs, discussing hypoglycemia and hyperglycemia episodes and symptoms, medications doses, her options of short and long term treatment based on the latest standards of care / guidelines;  discussion about incorporating lifestyle medicine;  and documenting the encounter. Risk reduction counseling performed per USPSTF guidelines to reduce  obesity and cardiovascular risk factors.     Please refer to Patient Instructions for Blood Glucose Monitoring and Insulin /Medications Dosing Guide"  in media tab for additional information. Please  also refer to " Patient Self Inventory" in the Media  tab for reviewed elements of pertinent patient history.  Inetta Oleta Bernhardt participated in the discussions, expressed understanding, and voiced agreement with the above plans.  All questions were answered to her satisfaction. she is encouraged to contact clinic should she have any questions or concerns prior to her return visit.   Follow up plan: - Return in about  4 months (around 02/23/2024) for F/U with Pre-visit Labs, Meter/CGM/Logs, A1c here.  Kalvin Orf, MD [facility] [address] [place], [place] [address] Phone: [phone]  Fax: [phone] 1  10/23/2023, 12:59 PM  This note was partially dictated with voice recognition software. Similar sounding words can be transcribed inadequately or may not  be corrected upon review.

## 2023-10-24 ENCOUNTER — Encounter: Attending: Nurse Practitioner | Admitting: Nutrition

## 2023-10-24 VITALS — Ht 67.5 in | Wt 189.0 lb

## 2023-10-24 DIAGNOSIS — E1165 Type 2 diabetes mellitus with hyperglycemia: Secondary | ICD-10-CM | POA: Insufficient documentation

## 2023-10-24 DIAGNOSIS — E785 Hyperlipidemia, unspecified: Secondary | ICD-10-CM | POA: Insufficient documentation

## 2023-10-24 DIAGNOSIS — E1169 Type 2 diabetes mellitus with other specified complication: Secondary | ICD-10-CM | POA: Insufficient documentation

## 2023-10-24 DIAGNOSIS — E0865 Diabetes mellitus due to underlying condition with hyperglycemia: Secondary | ICD-10-CM

## 2023-10-24 DIAGNOSIS — Z713 Dietary counseling and surveillance: Secondary | ICD-10-CM | POA: Insufficient documentation

## 2023-10-24 NOTE — Progress Notes (Unsigned)
 Medical Nutrition Therapy  Appointment Start time:  [phone]  Appointment End time:  1710  Primary concerns today: Dm Type 2   Referral diagnosis: E11.8 Preferred learning style: No preference  (auditory, visual, hands on, no preference indicated) Learning readiness: Ready    NUTRITION ASSESSMENT Follow up DM  Since April stopped insulin . Thought she could improve her blood sugar with fasting and not eating carbohydrates.. Had been fasting 1 meal per day. Has dexcom and complains of it's alarms going off frequently. Re educated on alarms related to elevated blood sugars. Had been not eating not past 7 pm. Focused on more fruits, vegetables and celery. A1C 8.1%. Saw Dr. [place] today. Going back to take her insulin . Lantus  20 units a day, Metformin  XR 500 mg BID.  Lab Results  Component Value Date   HGBA1C 8.1 (A) 10/23/2023   Anthropometrics  Wt Readings from Last 3 Encounters:  10/23/23 189 lb 3.2 oz (85.8 kg)  01/25/23 185 lb (83.9 kg)  12/20/22 188 lb 3.2 oz (85.4 kg)   Ht Readings from Last 3 Encounters:  10/23/23 5' 6.5" (1.689 m)  01/25/23 5\' 6"  (1.676 m)  12/20/22 5' 6.5" (1.689 m)   There is no height or weight on file to calculate BMI. @BMIFA @ Facility age limit for growth %iles is 20 years. Facility age limit for growth %iles is 20 years.    Clinical Medical Hx: See chart Medications: See chart Labs:  Lab Results  Component Value Date   HGBA1C 8.1 (A) 10/23/2023      Latest Ref Rng & Units 10/19/2023   11:00 AM 09/19/2022   10:09 AM 03/10/2022    9:58 AM  CMP  Glucose 70 - 99 mg/dL 960  454  098   BUN 6 - 24 mg/dL 11  13  12    Creatinine 0.57 - 1.00 mg/dL 1.19  1.47  8.29   Sodium 134 - 144 mmol/L 138  137  139   Potassium 3.5 - 5.2 mmol/L 4.6  4.3  4.7   Chloride 96 - 106 mmol/L 101  103  103   CO2 20 - 29 mmol/L 23  26  27    Calcium  8.7 - 10.2 mg/dL 9.8  9.8  56.2   Total Protein 6.0 - 8.5 g/dL 7.2  7.4  7.7   Total Bilirubin 0.0 - 1.2 mg/dL 0.6  0.7  0.7    Alkaline Phos 44 - 121 IU/L 82     AST 0 - 40 IU/L 19  17  17    ALT 0 - 32 IU/L 15  19  18     Lipid Panel     Component Value Date/Time   CHOL 230 (H) 10/19/2023 1100   TRIG 78 10/19/2023 1100   HDL 60 10/19/2023 1100   CHOLHDL 3.8 10/19/2023 1100   CHOLHDL 3.6 09/19/2022 1009   VLDL 14 04/05/2016 1017   LDLCALC 156 (H) 10/19/2023 1100   LDLCALC 152 (H) 09/19/2022 1009   LABVLDL 14 10/19/2023 1100    Notable Signs/Symptoms: None  Lifestyle & Dietary Hx Lives by herself. Eating some out and some at home.  Estimated daily fluid intake: 80 oz Supplements: VIt D Sleep: 8 Stress / self-care: Stomach issues are causing a problem Current average weekly physical activity: Walking 2-3 times per week 2-3 miles at a time  24-Hr Dietary Recall Has been fasting 14 hrs a day. Eating 1 meal per day   Estimated Energy Needs Calories: 1200 Carbohydrate: 135g Protein: 90g Fat: 33g  NUTRITION DIAGNOSIS  NB-1.1 Food and nutrition-related knowledge deficit As related to Diabetes Type 2.  As evidenced by A1C 8.7%.   NUTRITION INTERVENTION  Nutrition education (E-1) on the following topics:  Nutrition and Diabetes education provided on My Plate, CHO counting, meal planning, portion sizes, timing of meals, avoiding snacks between meals unless having a low blood sugar, target ranges for A1C and blood sugars, signs/symptoms and treatment of hyper/hypoglycemia, monitoring blood sugars, taking medications as prescribed, benefits of exercising 30 minutes per day and prevention of complications of DM.  Lifestyle Medicine  - Whole Food, Plant Predominant Nutrition is highly recommended: Eat Plenty of vegetables, Mushrooms, fruits, Legumes, Whole Grains, Nuts, seeds in lieu of processed meats, processed snacks/pastries red meat, poultry, eggs.    -It is better to avoid simple carbohydrates including: Cakes, Sweet Desserts, Ice Cream, Soda (diet and regular), Sweet Tea, Candies, Chips,  Cookies, Store Bought Juices, Alcohol in Excess of  1-2 drinks a day, Lemonade,  Artificial Sweeteners, Doughnuts, Coffee Creamers, "Sugar-free" Products, etc, etc.  This is not a complete list.....  Exercise: If you are able: 30 -60 minutes a day ,4 days a week, or 150 minutes a week.  The longer the better.  Combine stretch, strength, and aerobic activities.  If you were told in the past that you have high risk for cardiovascular diseases, you may seek evaluation by your heart doctor prior to initiating moderate to intense exercise programs.   Handouts Provided Include  Lifestyle Medicine Know your numbers. Using Dexcom  Learning Style & Readiness for Change Teaching method utilized: Visual & Auditory  Demonstrated degree of understanding via: Teach Back  Barriers to learning/adherence to lifestyle change: none  Goals Established by Pt Increase dark leafy greens 3 times per week. Get back to walking twice a week 30 minutes. Increase as tolerated 150 minutes per day Aim TIR reading to close 75%. Continue to take 20 units of Lantus /Levemir    MONITORING & EVALUATION Dietary intake, weekly physical activity, and blood sugars in 1 month.  Next Steps  Patient is to work on meal planning and meal prepping. Aaron Aas

## 2023-10-25 ENCOUNTER — Encounter: Payer: Self-pay | Admitting: "Endocrinology

## 2023-10-31 ENCOUNTER — Encounter: Payer: Self-pay | Admitting: Nutrition

## 2023-10-31 NOTE — Patient Instructions (Signed)
 Goals  Get back to eating 3 balanced meals per day B) 6-8 am, L)12-2 D) 5-7.  Drink only water  Focus on whole plant based foods-- foods from garden Avoid processed foods Walk 30 -60 minutes 3-4 times per week. Goal for morning blood sugars is less than 130 mg/dl  and bedtime less than 150 mg/dl.

## 2023-11-16 ENCOUNTER — Other Ambulatory Visit: Payer: Self-pay | Admitting: Family

## 2023-11-16 DIAGNOSIS — Z794 Long term (current) use of insulin: Secondary | ICD-10-CM

## 2023-12-05 ENCOUNTER — Telehealth: Payer: Self-pay

## 2023-12-05 NOTE — Telephone Encounter (Signed)
 Copied from CRM [id number]. Topic: Appointments - Scheduling Inquiry for Clinic >> Dec 05, 2023  8:41 AM Kristin Hess wrote: Reason for CRM: Patient called in stated she would like to know if she is able to add an health assessment in her new patient appointment on 07/15, would like for someone to message her through mychart if that is possible

## 2023-12-18 ENCOUNTER — Ambulatory Visit (INDEPENDENT_AMBULATORY_CARE_PROVIDER_SITE_OTHER): Payer: Self-pay | Admitting: Internal Medicine

## 2023-12-18 ENCOUNTER — Encounter: Payer: Self-pay | Admitting: Internal Medicine

## 2023-12-18 VITALS — BP 154/95 | HR 73 | Temp 98.2°F | Ht 67.5 in | Wt 191.9 lb

## 2023-12-18 DIAGNOSIS — I152 Hypertension secondary to endocrine disorders: Secondary | ICD-10-CM

## 2023-12-18 DIAGNOSIS — Z7984 Long term (current) use of oral hypoglycemic drugs: Secondary | ICD-10-CM

## 2023-12-18 DIAGNOSIS — E1169 Type 2 diabetes mellitus with other specified complication: Secondary | ICD-10-CM

## 2023-12-18 DIAGNOSIS — E785 Hyperlipidemia, unspecified: Secondary | ICD-10-CM | POA: Diagnosis not present

## 2023-12-18 DIAGNOSIS — E1159 Type 2 diabetes mellitus with other circulatory complications: Secondary | ICD-10-CM | POA: Diagnosis not present

## 2023-12-18 MED ORDER — ROSUVASTATIN CALCIUM 5 MG PO TABS: 5.0000 mg | ORAL_TABLET | Freq: Every day | ORAL | 1 refills | Status: DC

## 2023-12-18 NOTE — Assessment & Plan Note (Signed)
 Blood pressure is noted to be elevated today on 2 measurements.  However she states her average home blood pressure is 120/84.  She will return in 3 months and bring her ambulatory measurements to make a decision on whether we need to start antihypertensive therapy.

## 2023-12-18 NOTE — Progress Notes (Signed)
 New Patient Office Visit     CC/Reason for Visit: Establish care, discuss chronic medical conditions Previous PCP: Lonell Liverpool, NP Last Visit: Unknown  HPI: Kristin Hess is a 58 y.o. female who is coming in today for the above mentioned reasons. Past Medical History is significant for: Type 2 diabetes, hypertension and hyperlipidemia.  She is feeling well and has no concerns or complaints.  She has a strong family history of diabetes and early CVA.  She is not on a statin.  Her most recent A1c on May 20th was 8.1.  She is followed by endocrinology, Dr. Lenis in [place].   Past Medical/Surgical History: Past Medical History:  Diagnosis Date   Adrenal adenoma    Allergy    Anemia    Diabetes mellitus without complication (HCC)    Endometriosis of rectovaginal septum    GERD (gastroesophageal reflux disease)    Headache    MIGRAINES   Hyperaldosteronism (HCC)    Possibly   Hyperlipidemia    Hypertension    Hypokalemia    Ovarian cyst    Raynaud disease    hands and feet     Past Surgical History:  Procedure Laterality Date   CARDIAC CATHETERIZATION     CESAREAN SECTION     X 2   ROBOTIC ASSISTED TOTAL HYSTERECTOMY WITH BILATERAL SALPINGO OOPHERECTOMY Bilateral 06/16/2014   Procedure: ROBOTIC ASSISTED TOTAL HYSTERECTOMY WITH BILATERAL SALPINGO OOPHORECTOMY, LYSIS OF ADHESIONS;  Surgeon: Maurilio Ship, MD;  Location: [facility];  Service: Gynecology;  Laterality: Bilateral;   TUBAL LIGATION      Social History:  reports that she has never smoked. She has never used smokeless tobacco. She reports that she does not currently use alcohol. She reports that she does not use drugs.  Allergies: Allergies  Allergen Reactions   Ace Inhibitors Cough   Hctz [Hydrochlorothiazide] Cough    Family History:  Family History  Problem Relation Age of Onset   Stroke Mother    Diabetes Mother    Lung cancer Mother    Diabetes Father    Hepatitis C Father    Colon cancer  Father 36   Pancreatic cancer Father    Stroke Brother    Hypertension Brother    Scleroderma Maternal Grandmother    Stroke Maternal Grandfather    Lupus Maternal Aunt    Lupus Paternal Aunt    Breast cancer Neg Hx    Esophageal cancer Neg Hx    Stomach cancer Neg Hx      Current Outpatient Medications:    BD PEN NEEDLE MICRO U/F 32G X 6 MM MISC, USE ONCE DAILY AS DIRECTED, Disp: 100 each, Rfl: 2   Blood Glucose Monitoring Suppl (ONE TOUCH ULTRA MINI) w/Device KIT, See admin instructions. for testing, Disp: , Rfl: 0   Continuous Glucose Receiver (DEXCOM G7 RECEIVER) DEVI, 1 receiver, Disp: 1 each, Rfl: 0   Continuous Glucose Sensor (DEXCOM G7 SENSOR) MISC, Apply new sensor every 10 days, Disp: 3 each, Rfl: 3   glucose blood test strip, Use as instructed, Disp: 100 each, Rfl: 12   insulin  glargine (LANTUS  SOLOSTAR) 100 UNIT/ML Solostar Pen, Inject 20 Units into the skin at bedtime., Disp: 15 mL, Rfl: 1   metFORMIN  (GLUCOPHAGE -XR) 500 MG 24 hr tablet, Take 1 tablet (500 mg total) by mouth daily with breakfast., Disp: 90 tablet, Rfl: 1   rosuvastatin  (CRESTOR ) 5 MG tablet, Take 1 tablet (5 mg total) by mouth daily., Disp: 90 tablet, Rfl:  1   spironolactone  (ALDACTONE ) 50 MG tablet, Take 50mg  in am and 50mg  in pm, Disp: 180 tablet, Rfl: 2   amoxicillin  (AMOXIL ) 500 MG tablet, Take 1 tablet (500 mg total) by mouth 3 (three) times daily. 10 days, Disp: 30 tablet, Rfl: 0   Magnesium  250 MG TABS, Take by mouth. (Patient not taking: Reported on 01/25/2023), Disp: , Rfl:    Vitamin D , Ergocalciferol , (DRISDOL ) 1.25 MG (50000 UNIT) CAPS capsule, Take 1 capsule (50,000 Units total) by mouth every 7 (seven) days., Disp: 12 capsule, Rfl: 7  Review of Systems:  Negative except as indicated in HPI.   Physical Exam: Vitals:   12/18/23 1331 12/18/23 1335  BP: (!) 150/90 (!) 154/95  Pulse: 73   Temp: 98.2 F (36.8 C)   TempSrc: Oral   SpO2: 98%   Weight: 191 lb 14.4 oz (87 kg)   Height: 5'  7.5 (1.715 m)    Body mass index is 29.61 kg/m.    Impression and Plan:  Type 2 diabetes mellitus with hyperlipidemia (HCC) Assessment & Plan: Uncontrolled with an A1c of 8.1.  Is currently on metformin  once daily and long-acting insulin  at bedtime.  We have discussed how I believe she would be a good candidate for GLP-1.  She will discuss with her endocrinologist at next visit.  Orders: -     Rosuvastatin  Calcium ; Take 1 tablet (5 mg total) by mouth daily.  Dispense: 90 tablet; Refill: 1  Hyperlipidemia associated with type 2 diabetes mellitus (HCC) Assessment & Plan: Most recent lipid panel from May 2025 with a total cholesterol of 230, HDL 60, LDL 156 and triglycerides 78.  We have discussed starting a statin with a goal LDL of less than 60.  She agrees to start rosuvastatin  5 mg daily.  Recheck lipid panel in 3 months.   Hypertension associated with diabetes (HCC) Assessment & Plan: Blood pressure is noted to be elevated today on 2 measurements.  However she states her average home blood pressure is 120/84.  She will return in 3 months and bring her ambulatory measurements to make a decision on whether we need to start antihypertensive therapy.     Time spent: 46 minutes reviewing chart, interviewing and examining patient and formulating plan of care.      Tully Theophilus Andrews, MD [facility]

## 2023-12-18 NOTE — Assessment & Plan Note (Signed)
 Uncontrolled with an A1c of 8.1.  Is currently on metformin  once daily and long-acting insulin  at bedtime.  We have discussed how I believe she would be a good candidate for GLP-1.  She will discuss with her endocrinologist at next visit.

## 2023-12-18 NOTE — Assessment & Plan Note (Signed)
 Most recent lipid panel from May 2025 with a total cholesterol of 230, HDL 60, LDL 156 and triglycerides 78.  We have discussed starting a statin with a goal LDL of less than 60.  She agrees to start rosuvastatin  5 mg daily.  Recheck lipid panel in 3 months.

## 2023-12-29 LAB — HM DIABETES EYE EXAM

## 2024-02-03 ENCOUNTER — Other Ambulatory Visit: Payer: Self-pay | Admitting: Nurse Practitioner

## 2024-02-03 DIAGNOSIS — E1169 Type 2 diabetes mellitus with other specified complication: Secondary | ICD-10-CM

## 2024-02-03 DIAGNOSIS — I10 Essential (primary) hypertension: Secondary | ICD-10-CM

## 2024-02-06 ENCOUNTER — Other Ambulatory Visit: Payer: Self-pay | Admitting: "Endocrinology

## 2024-02-06 ENCOUNTER — Encounter: Admitting: Internal Medicine

## 2024-02-06 DIAGNOSIS — E1169 Type 2 diabetes mellitus with other specified complication: Secondary | ICD-10-CM

## 2024-02-06 DIAGNOSIS — I10 Essential (primary) hypertension: Secondary | ICD-10-CM

## 2024-02-23 LAB — LIPID PANEL
Chol/HDL Ratio: 3.5 ratio (ref 0.0–4.4)
Cholesterol, Total: 210 mg/dL — ABNORMAL HIGH (ref 100–199)
HDL: 60 mg/dL (ref >39)
LDL Chol Calc (NIH): 142 mg/dL — ABNORMAL HIGH (ref 0–99)
Triglycerides: 46 mg/dL (ref 0–149)
VLDL Cholesterol Cal: 8 mg/dL (ref 5–40)

## 2024-02-25 ENCOUNTER — Ambulatory Visit: Admitting: "Endocrinology

## 2024-02-25 ENCOUNTER — Ambulatory Visit: Admitting: Nutrition

## 2024-03-04 ENCOUNTER — Ambulatory Visit: Admitting: "Endocrinology

## 2024-03-05 ENCOUNTER — Encounter: Payer: Self-pay | Admitting: "Endocrinology

## 2024-03-05 ENCOUNTER — Ambulatory Visit: Admitting: "Endocrinology

## 2024-03-05 VITALS — BP 120/84 | HR 76 | Ht 67.5 in | Wt 191.8 lb

## 2024-03-05 DIAGNOSIS — E785 Hyperlipidemia, unspecified: Secondary | ICD-10-CM

## 2024-03-05 DIAGNOSIS — Z7984 Long term (current) use of oral hypoglycemic drugs: Secondary | ICD-10-CM

## 2024-03-05 DIAGNOSIS — I10 Essential (primary) hypertension: Secondary | ICD-10-CM | POA: Insufficient documentation

## 2024-03-05 DIAGNOSIS — E782 Mixed hyperlipidemia: Secondary | ICD-10-CM | POA: Diagnosis not present

## 2024-03-05 DIAGNOSIS — E1169 Type 2 diabetes mellitus with other specified complication: Secondary | ICD-10-CM

## 2024-03-05 DIAGNOSIS — Z794 Long term (current) use of insulin: Secondary | ICD-10-CM | POA: Diagnosis not present

## 2024-03-05 DIAGNOSIS — Z5320 Procedure and treatment not carried out because of patient's decision for unspecified reasons: Secondary | ICD-10-CM | POA: Insufficient documentation

## 2024-03-05 LAB — POCT GLYCOSYLATED HEMOGLOBIN (HGB A1C): HbA1c, POC (controlled diabetic range): 7.3 % — AB (ref 0.0–7.0)

## 2024-03-05 NOTE — Progress Notes (Signed)
 03/05/2024, 4:40 PM  Endocrinology follow-up note   Subjective:    Patient ID: Kristin Hess, female    DOB: March 09, 1966.  Kristin Hess is being seen in follow-up after she was seen in consultation for management of currently uncontrolled symptomatic diabetes requested by  Theophilus Andrews, Tully GRADE, MD.   Past Medical History:  Diagnosis Date   Adrenal adenoma    Allergy    Anemia    Diabetes mellitus without complication (HCC)    Endometriosis of rectovaginal septum    GERD (gastroesophageal reflux disease)    Headache    MIGRAINES   Hyperaldosteronism    Possibly   Hyperlipidemia    Hypertension    Hypokalemia    Ovarian cyst    Raynaud disease    hands and feet     Past Surgical History:  Procedure Laterality Date   CARDIAC CATHETERIZATION     CESAREAN SECTION     X 2   ROBOTIC ASSISTED TOTAL HYSTERECTOMY WITH BILATERAL SALPINGO OOPHERECTOMY Bilateral 06/16/2014   Procedure: ROBOTIC ASSISTED TOTAL HYSTERECTOMY WITH BILATERAL SALPINGO OOPHORECTOMY, LYSIS OF ADHESIONS;  Surgeon: Maurilio Ship, MD;  Location: [facility];  Service: Gynecology;  Laterality: Bilateral;   TUBAL LIGATION      Social History   Socioeconomic History   Marital status: Divorced    Spouse name: Richard   Number of children: 2   Years of education: 14   Highest education level: Some college, no degree  Occupational History   Occupation: Works from home at [employer]  Tobacco Use   Smoking status: Never   Smokeless tobacco: Never  Vaping Use   Vaping status: Never Used  Substance and Sexual Activity   Alcohol use: Not Currently    Comment: occasional   Drug use: No   Sexual activity: Not Currently  Other Topics Concern   Not on file  Social History Narrative   Lives at home w/ youngest daughter   Right-handed   Drinks about 16-32 oz of caffeinated beverages per day   Social Drivers of  Health   Financial Resource Strain: Patient Declined (12/17/2023)   Overall Financial Resource Strain (CARDIA)    Difficulty of Paying Living Expenses: Patient declined  Food Insecurity: Patient Declined (12/17/2023)   Hunger Vital Sign    Worried About Running Out of Food in the Last Year: Patient declined    Ran Out of Food in the Last Year: Patient declined  Transportation Needs: Patient Declined (12/17/2023)   PRAPARE - Administrator, Civil Service (Medical): Patient declined    Lack of Transportation (Non-Medical): Patient declined  Physical Activity: Insufficiently Active (12/17/2023)   Exercise Vital Sign    Days of Exercise per Week: 2 days    Minutes of Exercise per Session: 40 min  Stress: No Stress Concern Present (12/17/2023)   [employer] of Occupational Health - Occupational Stress Questionnaire    Feeling of Stress: Only a little  Social Connections: Socially Isolated (12/17/2023)   Social Connection and Isolation Panel    Frequency of Communication with Friends and Family: More than three  times a week    Frequency of Social Gatherings with Friends and Family: Patient declined    Attends Religious Services: Patient declined    Database administrator or Organizations: No    Attends Engineer, structural: Not on file    Marital Status: Divorced    Family History  Problem Relation Age of Onset   Stroke Mother    Diabetes Mother    Lung cancer Mother    Diabetes Father    Hepatitis C Father    Colon cancer Father 26   Pancreatic cancer Father    Stroke Brother    Hypertension Brother    Scleroderma Maternal Grandmother    Stroke Maternal Grandfather    Lupus Maternal Aunt    Lupus Paternal Aunt    Breast cancer Neg Hx    Esophageal cancer Neg Hx    Stomach cancer Neg Hx     Outpatient Encounter Medications as of 03/05/2024  Medication Sig   amoxicillin  (AMOXIL ) 500 MG tablet Take 1 tablet (500 mg total) by mouth 3 (three) times daily.  10 days   BD PEN NEEDLE MICRO U/F 32G X 6 MM MISC USE ONCE DAILY AS DIRECTED   Blood Glucose Monitoring Suppl (ONE TOUCH ULTRA MINI) w/Device KIT See admin instructions. for testing   Continuous Glucose Receiver (DEXCOM G7 RECEIVER) DEVI 1 receiver   Continuous Glucose Sensor (DEXCOM G7 SENSOR) MISC Apply new sensor every 10 days   glucose blood test strip Use as instructed   insulin  glargine (LANTUS  SOLOSTAR) 100 UNIT/ML Solostar Pen Inject 20 Units into the skin at bedtime.   Magnesium  250 MG TABS Take by mouth. (Patient not taking: Reported on 01/25/2023)   metFORMIN  (GLUCOPHAGE -XR) 500 MG 24 hr tablet Take 1 tablet (500 mg total) by mouth daily with breakfast.   spironolactone  (ALDACTONE ) 50 MG tablet Take 1 tablet (50 mg total) by mouth daily.   Vitamin D , Ergocalciferol , (DRISDOL ) 1.25 MG (50000 UNIT) CAPS capsule Take 1 capsule (50,000 Units total) by mouth every 7 (seven) days.   [DISCONTINUED] rosuvastatin  (CRESTOR ) 5 MG tablet Take 1 tablet (5 mg total) by mouth daily.   No facility-administered encounter medications on file as of 03/05/2024.    ALLERGIES: Allergies  Allergen Reactions   Ace Inhibitors Cough   Hctz [Hydrochlorothiazide] Cough    VACCINATION STATUS: Immunization History  Administered Date(s) Administered   Hepatitis B, PED/ADOLESCENT 07/30/2022, 08/31/2022   Influenza-Unspecified 07/29/2022   Moderna Sars-Covid-2 Vaccination 06/11/2020, 07/09/2020   Td 06/06/2007, 07/29/2022    Diabetes She presents for her follow-up diabetic visit. She has type 2 diabetes mellitus. Onset time: She was diagnosed at approximate age of 44 years. Her disease course has been improving. There are no hypoglycemic associated symptoms. Pertinent negatives for hypoglycemia include no confusion, headaches, pallor or seizures. Pertinent negatives for diabetes include no chest pain, no fatigue, no polydipsia, no polyphagia and no polyuria. There are no hypoglycemic complications. Symptoms  are improving. There are no diabetic complications. Risk factors for coronary artery disease include diabetes mellitus, dyslipidemia, family history and hypertension. Her weight is fluctuating minimally. She has had a previous visit with a dietitian. She participates in exercise intermittently. Her home blood glucose trend is decreasing steadily. Her breakfast blood glucose range is generally 140-180 mg/dl. Her lunch blood glucose range is generally 140-180 mg/dl. Her dinner blood glucose range is generally 140-180 mg/dl. Her bedtime blood glucose range is generally 140-180 mg/dl. Her overall blood glucose range is 140-180 mg/dl. (She  did continue to use her CGM.  Analysis shows 71% time in range, 23% level 1 hyperglycemia, 6% every 2 hyperglycemia.   Her point-of-care A1c 7.3% improving from 8.1% during her last visit.  She has not documented any hypoglycemia, average blood glucose is 165 mg per DL for the most recent 2 weeks.) An ACE inhibitor/angiotensin II receptor blocker is contraindicated (ACE inhibitor's and hydrochlorothiazide are listed as allergies.).  Hyperlipidemia This is a chronic problem. The current episode started more than 1 year ago. Exacerbating diseases include diabetes. Pertinent negatives include no chest pain, myalgias or shortness of breath. She is currently on no antihyperlipidemic treatment. Risk factors for coronary artery disease include diabetes mellitus, dyslipidemia, hypertension, post-menopausal and family history.  Hypertension This is a chronic problem. The problem is controlled. Pertinent negatives include no chest pain, headaches, palpitations or shortness of breath. Risk factors for coronary artery disease include dyslipidemia, diabetes mellitus and post-menopausal state. Treatments tried: Spironolactone  50 mg once a day.     Objective:       03/05/2024    3:22 PM 12/18/2023    1:35 PM 12/18/2023    1:31 PM  Vitals with BMI  Height 5' 7.5  5' 7.5  Weight 191  lbs 13 oz  191 lbs 14 oz  BMI 29.58  29.59  Systolic 120 154 849  Diastolic 84 95 90  Pulse 76  73    BP 120/84   Pulse 76   Ht 5' 7.5 (1.715 m)   Wt 191 lb 12.8 oz (87 kg)   LMP 05/16/2014   BMI 29.60 kg/m   Wt Readings from Last 3 Encounters:  03/05/24 191 lb 12.8 oz (87 kg)  12/18/23 191 lb 14.4 oz (87 kg)  10/24/23 189 lb (85.7 kg)      CMP ( most recent) CMP     Component Value Date/Time   NA 138 10/19/2023 1100   K 4.6 10/19/2023 1100   CL 101 10/19/2023 1100   CO2 23 10/19/2023 1100   GLUCOSE 141 (H) 10/19/2023 1100   GLUCOSE 199 (H) 09/19/2022 1009   BUN 11 10/19/2023 1100   CREATININE 0.82 10/19/2023 1100   CREATININE 0.76 09/19/2022 1009   CALCIUM  9.8 10/19/2023 1100   PROT 7.2 10/19/2023 1100   ALBUMIN 4.8 10/19/2023 1100   AST 19 10/19/2023 1100   ALT 15 10/19/2023 1100   ALKPHOS 82 10/19/2023 1100   BILITOT 0.6 10/19/2023 1100   EGFR 83 10/19/2023 1100   GFRNONAA 93 08/26/2020 1600   Diabetic Labs (most recent): Lab Results  Component Value Date   HGBA1C 7.3 (A) 03/05/2024   HGBA1C 8.1 (A) 10/23/2023   HGBA1C 8.7 (H) 09/19/2022   MICROALBUR <0.2 09/19/2022   MICROALBUR 0.8 08/29/2021   MICROALBUR 0.7 07/07/2019     Lipid Panel ( most recent) Lipid Panel     Component Value Date/Time   CHOL 210 (H) 02/22/2024 1347   TRIG 46 02/22/2024 1347   HDL 60 02/22/2024 1347   CHOLHDL 3.5 02/22/2024 1347   CHOLHDL 3.6 09/19/2022 1009   VLDL 14 04/05/2016 1017   LDLCALC 142 (H) 02/22/2024 1347   LDLCALC 152 (H) 09/19/2022 1009   LABVLDL 8 02/22/2024 1347      Lab Results  Component Value Date   TSH 2.420 10/19/2023   TSH 1.89 09/19/2022   TSH 2.68 03/10/2022   TSH 1.75 08/29/2021   TSH 1.22 05/12/2021   TSH 1.68 08/26/2020   TSH 2.18 01/22/2020   TSH  1.93 10/20/2019   TSH 2.37 07/07/2019   TSH 1.40 01/06/2019   FREET4 1.01 10/19/2023       Assessment & Plan:   1. Type 2 diabetes mellitus with hyperlipidemia (HCC) 2. Mixed  hyperlipidemia  - Kristin Hess has currently uncontrolled symptomatic type 2 DM since  58 years of age. She did continue to use her CGM.  Analysis shows 71% time in range, 23% level 1 hyperglycemia, 6% every 2 hyperglycemia.   Her point-of-care A1c 7.3% improving from 8.1% during her last visit.  She has not documented any hypoglycemia, average blood glucose is 165 mg per DL for the most recent 2 weeks. Recent labs reviewed. - I had a long discussion with her about the possible risk factors and  the pathology behind its diabetes and its complications. -her diabetes is complicated by comorbid hyperlipidemia, hypertension and she remains at a high risk for more acute and chronic complications which include CAD, CVA, CKD, retinopathy, and neuropathy. These are all discussed in detail with her.  - I discussed all available options of managing her diabetes including de-escalation of medications. I have counseled her on Food as Medicine by adopting a Whole Food , Plant Predominant  ( WFPP) nutrition as recommended by [employer] of Lifestyle Medicine. Patient is encouraged to switch to  unprocessed or minimally processed  complex starch, adequate protein intake (mainly plant source), minimal liquid fat, plenty of fruits, and vegetables. -  she is advised to stick to a routine mealtimes to eat 3 complete meals a day and snack only when necessary ( to snack only to correct hypoglycemia BG <70 day time or <100 at night).   - she would like to avoid escalation of medications for treatment of diabetes, hyperlipidemia, and she acknowledges that there is a room for improvement in her food and drink choices.  - she acknowledges that there is a room for improvement in her food and drink choices. - Suggestion is made for her to avoid simple carbohydrates  from her diet including Cakes, Sweet Desserts, Ice Cream, Soda (diet and regular), Sweet Tea, Candies, Chips, Cookies, Store Bought Juices, Alcohol ,  Artificial Sweeteners,  Coffee Creamer, and Sugar-free Products, Lemonade. This will help patient to have more stable blood glucose profile and potentially avoid unintended weight gain.  The following Lifestyle Medicine recommendations according to American College of Lifestyle Medicine  [facility]) were discussed and and offered to patient and she  agrees to start the journey:  A. Whole Foods, Plant-Based Nutrition comprising of fruits and vegetables, plant-based proteins, whole-grain carbohydrates was discussed in detail with the patient.   A list for source of those nutrients were also provided to the patient.  Patient will use only water  or unsweetened tea for hydration. B.  The need to stay away from risky substances including alcohol, smoking; obtaining 7 to 9 hours of restorative sleep, at least 150 minutes of moderate intensity exercise weekly, the importance of healthy social connections,  and stress management techniques were discussed. C.  A full color page of  Calorie density of various food groups per pound showing examples of each food groups was provided to the patient.   - she ha seen Santana Duke, RDN, CDE for individualized diabetes education.  - I have approached her with the following individualized plan to manage  her diabetes and patient agrees:   - In light of her presentation with a near target glycemic profile, she will not need prandial insulin  for now.  She is advised to continue Lantus  20 units nightly.  She was offered to increase metformin  or add GLP-1 receptors, however would like to postpone this decisions for now.    She is willing to keep metformin  500 mg XR daily at breakfast.   - she is encouraged to call clinic for blood glucose levels less than 70 or above 180 mg per DL weekly average.    -Patient is motivated for deprescription.  She has a chance depending on her engagement with lifestyle medicine. If she returns with worsening glycemic profile, she will be  reconsidered for either GLP-1 receptor agonist or SGLT2 inhibitor.  - Specific targets for  A1c;  LDL, HDL,  and Triglycerides were discussed with the patient.  2) Blood Pressure /Hypertension:  Her blood pressure is controlled to target.  ACE inhibitor's and hydrochlorothiazide are listed as allergies.  She is advised to continue spironolactone  50 mg once a day.  As she records, spironolactone  was given to her due to high aldosterone.  While taking spironolactone , her aldosterone level in May 2025 was elevated at 34.5.  This level could be misleading due to the fact that she was taking aldosterone blocker.  She will benefit from adrenal imaging in an attempt to locate a source for aldosterone.  She will  also be offered repeat plasma aldosterone concentration, plasma renin activity and ratio.       3) Lipids/Hyperlipidemia:   Review of her recent lipid panel showed uncontrolled lipid panel with LDL at  142 slightly better than 156.  Admittedly, she has not been taking Crestor .  She would like to avoid statins for now.  I did have a long discussion with her about complications dyslipidemia and pace of her diabetes type 2.   She still wants to statin treatment or any other antilipid intervention and wishes to bring it down by eating right hand exercise more.   4)  Weight/Diet:  Body mass index is 29.6 kg/m.  -   clearly complicating her diabetes care.   she is  a candidate for weight loss. I discussed with her the fact that loss of 5 - 10% of her  current body weight will have the most impact on her diabetes management.  The above detailed  ACLM recommendations for nutrition, exercise, sleep, social life, avoidance of risky substances, the need for restorative sleep   information will also detailed on discharge instructions.  5) Chronic Care/Health Maintenance:  -she  is not on  ACEI/ARB and Statin medications and  is encouraged to initiate and continue to follow up with Ophthalmology, Dentist,   Podiatrist at least yearly or according to recommendations, and advised to   stay away from smoking. I have recommended yearly flu vaccine and pneumonia vaccine at least every 5 years; moderate intensity exercise for up to 150 minutes weekly; and  sleep for 7- 9 hours a day.  - she is  advised to maintain close follow up with Theophilus Andrews, Tully GRADE, MD for primary care needs, as well as her other providers for optimal and coordinated care.   I spent  41  minutes in the care of the patient today including review of labs from CMP, Lipids, Thyroid  Function, Hematology (current and previous including abstractions from other facilities); face-to-face time discussing  her blood glucose readings/logs, discussing hypoglycemia and hyperglycemia episodes and symptoms, medications doses, her options of short and long term treatment based on the latest standards of care / guidelines;  discussion about incorporating lifestyle medicine;  and documenting the encounter. Risk reduction counseling performed per USPSTF guidelines to reduce cardiovascular risk factors.     Please refer to Patient Instructions for Blood Glucose Monitoring and Insulin /Medications Dosing Guide  in media tab for additional information. Please  also refer to  Patient Self Inventory in the Media  tab for reviewed elements of pertinent patient history.  Kristin Hess participated in the discussions, expressed understanding, and voiced agreement with the above plans.  All questions were answered to her satisfaction. she is encouraged to contact clinic should she have any questions or concerns prior to her return visit.   Follow up plan: - Return in about 4 months (around 07/06/2024), or CT  [facility], for A1c -NV, F/U with Pre-visit Labs, Meter/CGM/Logs, A1c here.  Ranny Earl, MD [facility] [address] [place], [place] [address] Phone: [phone]  Fax: [phone]  1  03/05/2024, 4:40 PM  This note was partially dictated with voice recognition software. Similar sounding words can be transcribed inadequately or may not  be corrected upon review.

## 2024-03-05 NOTE — Patient Instructions (Signed)

## 2024-03-06 ENCOUNTER — Encounter: Payer: Self-pay | Admitting: Nutrition

## 2024-03-06 ENCOUNTER — Encounter: Payer: Self-pay | Admitting: "Endocrinology

## 2024-03-06 ENCOUNTER — Encounter: Attending: "Endocrinology | Admitting: Nutrition

## 2024-03-06 VITALS — Ht 67.5 in | Wt 192.0 lb

## 2024-03-06 DIAGNOSIS — I10 Essential (primary) hypertension: Secondary | ICD-10-CM | POA: Diagnosis present

## 2024-03-06 DIAGNOSIS — E785 Hyperlipidemia, unspecified: Secondary | ICD-10-CM | POA: Insufficient documentation

## 2024-03-06 DIAGNOSIS — E66812 Obesity, class 2: Secondary | ICD-10-CM | POA: Insufficient documentation

## 2024-03-06 DIAGNOSIS — E0865 Diabetes mellitus due to underlying condition with hyperglycemia: Secondary | ICD-10-CM | POA: Insufficient documentation

## 2024-03-06 DIAGNOSIS — E1169 Type 2 diabetes mellitus with other specified complication: Secondary | ICD-10-CM | POA: Diagnosis present

## 2024-03-06 NOTE — Patient Instructions (Addendum)
  Goals Great job! Cut out sodas and sweet tea Get back to walking 45 minutes twice a day Keep drinking water  Aim to get 6.5% in the next 4 months Aim for 25 grams of fiber per day

## 2024-03-06 NOTE — Progress Notes (Signed)
 Medical Nutrition Therapy  Appointment Start time:  [id number] Appointment End time:  1641  Primary concerns today: Type 2 Dm  Referral diagnosis: E11.8 Preferred learning style: No preference   Learning readiness: Ready    NUTRITION ASSESSMENT  58 yr old Wfemale referred for Type 2 DM. A1C down to 7.3%. PCP Dr. Theophilus Endocrinologist Dr. Lenis A1C 7.3% CGM- Dexcom Has found her Dexcom to really help her see effects of what she eats and blood sugar. Metformin  500 mg ER once a day. 20 units at night [place]. Eating a lot more plant based foods of dried beans, vegetables, fruits and cut out processed foods.  Still struggles with eating 3 times per day and working on cutting out sweet tea and Dr. Nunzio again. She is willing to work with [facility] to focus on more whole plant based foods and the 6 pillars of health to reverse her DM, Obesity and other health issues.  Anthropometrics  Wt Readings from Last 3 Encounters:  03/06/24 192 lb (87.1 kg)  03/05/24 191 lb 12.8 oz (87 kg)  12/18/23 191 lb 14.4 oz (87 kg)   Ht Readings from Last 3 Encounters:  03/06/24 5' 7.5 (1.715 m)  03/05/24 5' 7.5 (1.715 m)  12/18/23 5' 7.5 (1.715 m)   Body mass index is 29.63 kg/m. @BMIFA @ Facility age limit for growth %iles is 20 years. Facility age limit for growth %iles is 20 years.   Clinical Medical Hx:  Past Medical History:  Diagnosis Date   Adrenal adenoma    Allergy    Anemia    Diabetes mellitus without complication (HCC)    Endometriosis of rectovaginal septum    GERD (gastroesophageal reflux disease)    Headache    MIGRAINES   Hyperaldosteronism    Possibly   Hyperlipidemia    Hypertension    Hypokalemia    Ovarian cyst    Raynaud disease    hands and feet     Medications:  Current Outpatient Medications on File Prior to Visit  Medication Sig Dispense Refill   amoxicillin  (AMOXIL ) 500 MG tablet Take 1 tablet (500 mg total) by mouth 3 (three) times daily. 10  days 30 tablet 0   BD PEN NEEDLE MICRO U/F 32G X 6 MM MISC USE ONCE DAILY AS DIRECTED 100 each 2   Blood Glucose Monitoring Suppl (ONE TOUCH ULTRA MINI) w/Device KIT See admin instructions. for testing  0   Continuous Glucose Receiver (DEXCOM G7 RECEIVER) DEVI 1 receiver 1 each 0   Continuous Glucose Sensor (DEXCOM G7 SENSOR) MISC Apply new sensor every 10 days 3 each 3   glucose blood test strip Use as instructed 100 each 12   insulin  glargine (LANTUS  SOLOSTAR) 100 UNIT/ML Solostar Pen Inject 20 Units into the skin at bedtime. 15 mL 1   Magnesium  250 MG TABS Take by mouth. (Patient not taking: Reported on 01/25/2023)     metFORMIN  (GLUCOPHAGE -XR) 500 MG 24 hr tablet Take 1 tablet (500 mg total) by mouth daily with breakfast. 90 tablet 0   spironolactone  (ALDACTONE ) 50 MG tablet Take 1 tablet (50 mg total) by mouth daily. 90 tablet 0   Vitamin D , Ergocalciferol , (DRISDOL ) 1.25 MG (50000 UNIT) CAPS capsule Take 1 capsule (50,000 Units total) by mouth every 7 (seven) days. 12 capsule 7   No current facility-administered medications on file prior to visit.    Labs:     Latest Ref Rng & Units 10/19/2023   11:00 AM 09/19/2022   10:09  AM 03/10/2022    9:58 AM  CMP  Glucose 70 - 99 mg/dL 858  800  824   BUN 6 - 24 mg/dL 11  13  12    Creatinine 0.57 - 1.00 mg/dL 9.17  9.23  9.09   Sodium 134 - 144 mmol/L 138  137  139   Potassium 3.5 - 5.2 mmol/L 4.6  4.3  4.7   Chloride 96 - 106 mmol/L 101  103  103   CO2 20 - 29 mmol/L 23  26  27    Calcium  8.7 - 10.2 mg/dL 9.8  9.8  89.6   Total Protein 6.0 - 8.5 g/dL 7.2  7.4  7.7   Total Bilirubin 0.0 - 1.2 mg/dL 0.6  0.7  0.7   Alkaline Phos 44 - 121 IU/L 82     AST 0 - 40 IU/L 19  17  17    ALT 0 - 32 IU/L 15  19  18     Lipid Panel     Component Value Date/Time   CHOL 210 (H) 02/22/2024 1347   TRIG 46 02/22/2024 1347   HDL 60 02/22/2024 1347   CHOLHDL 3.5 02/22/2024 1347   CHOLHDL 3.6 09/19/2022 1009   VLDL 14 04/05/2016 1017   LDLCALC 142 (H)  02/22/2024 1347   LDLCALC 152 (H) 09/19/2022 1009   LABVLDL 8 02/22/2024 1347   Lab Results  Component Value Date   HGBA1C 7.3 (A) 03/05/2024    Notable Signs/Symptoms: Tired, thirsty, frequent urniation  Lifestyle & Dietary Hx Lives by herself. Eating some out and some at home.  Estimated daily fluid intake: 80 oz Supplements: VIt D Sleep: 8 Stress / self-care: Stomach issues are causing a problem Current average weekly physical activity: Walking 2-3 times per week 2-3 miles at a time  24-Hr Dietary Recall First Meal: egg whites or omelet-- has cut back cheese and shrimp or rye bread with avacado. Snack:  Second Meal:Leftover egg scamble with kidney beans, half and half tea; occassionally dr. Nunzio..  Snack:  Beverages: water   Estimated Energy Needs Calories: 1200 Carbohydrate: 135g Protein: 90g Fat: 33g   NUTRITION DIAGNOSIS  NB-1.1 Food and nutrition-related knowledge deficit As related to Diabetes Type 2.  As evidenced by A1c 8.7%.   NUTRITION INTERVENTION  Nutrition education (E-1) on the following topics:  Nutrition and Diabetes education provided on My Plate, CHO counting, meal planning, portion sizes, timing of meals, avoiding snacks between meals unless having a low blood sugar, target ranges for A1C and blood sugars, signs/symptoms and treatment of hyper/hypoglycemia, monitoring blood sugars, taking medications as prescribed, benefits of exercising 30 minutes per day and prevention of complications of DM.  Lifestyle Medicine  - Whole Food, Plant Predominant Nutrition is highly recommended: Eat Plenty of vegetables, Mushrooms, fruits, Legumes, Whole Grains, Nuts, seeds in lieu of processed meats, processed snacks/pastries red meat, poultry, eggs.    -It is better to avoid simple carbohydrates including: Cakes, Sweet Desserts, Ice Cream, Soda (diet and regular), Sweet Tea, Candies, Chips, Cookies, Store Bought Juices, Alcohol in Excess of  1-2 drinks a day,  Lemonade,  Artificial Sweeteners, Doughnuts, Coffee Creamers, Sugar-free Products, etc, etc.  This is not a complete list.....  Exercise: If you are able: 30 -60 minutes a day ,4 days a week, or 150 minutes a week.  The longer the better.  Combine stretch, strength, and aerobic activities.  If you were told in the past that you have high risk for cardiovascular diseases, you may seek  evaluation by your heart doctor prior to initiating moderate to intense exercise programs.   Handouts Provided Include  Know your numbers. [facility] handouts Learning Style & Readiness for Change Teaching method utilized: Visual & Auditory  Demonstrated degree of understanding via: Teach Back  Barriers to learning/adherence to lifestyle change: none  Goals Established by Pt Great job! Cut out sodas and sweet tea Get back to walking 45 minutes twice a day Keep drinking water  Aim to get 6.5% in the next 4 months Aim for 25 grams of fiber per day   MONITORING & EVALUATION Dietary intake, weekly physical activity, and BS in 1 month.  Next Steps  Patient is to work on meal planning and meal prepping.SABRA

## 2024-03-19 ENCOUNTER — Encounter: Payer: Self-pay | Admitting: Internal Medicine

## 2024-03-19 ENCOUNTER — Ambulatory Visit: Admitting: Internal Medicine

## 2024-03-19 VITALS — BP 125/78 | HR 82 | Temp 97.7°F | Ht 67.5 in | Wt 188.2 lb

## 2024-03-19 DIAGNOSIS — I152 Hypertension secondary to endocrine disorders: Secondary | ICD-10-CM | POA: Diagnosis not present

## 2024-03-19 DIAGNOSIS — E1169 Type 2 diabetes mellitus with other specified complication: Secondary | ICD-10-CM | POA: Diagnosis not present

## 2024-03-19 DIAGNOSIS — E785 Hyperlipidemia, unspecified: Secondary | ICD-10-CM | POA: Diagnosis not present

## 2024-03-19 DIAGNOSIS — E1159 Type 2 diabetes mellitus with other circulatory complications: Secondary | ICD-10-CM | POA: Diagnosis not present

## 2024-03-19 NOTE — Assessment & Plan Note (Signed)
 Most recent lipid panel from September 2025 with a total cholesterol of 210, HDL 60, LDL 142 and triglycerides 46.  We have again discussed importance of statin to reduce cardiovascular risk.  She declines.  She will continue to work on lifestyle changes.

## 2024-03-19 NOTE — Assessment & Plan Note (Signed)
 Fairly well-controlled.  Continue to watch off medication.

## 2024-03-19 NOTE — Assessment & Plan Note (Signed)
 Followed by endocrinology, A1c has decreased to 7.3.

## 2024-03-19 NOTE — Progress Notes (Signed)
 Established Patient Office Visit     CC/Reason for Visit: Follow-up chronic conditions  HPI: Kristin Hess is a 58 y.o. female who is coming in today for the above mentioned reasons. Past Medical History is significant for: Hypertension, hyperlipidemia, type 2 diabetes.  She sees endocrinology.  Her A1c has been improving.  She has been working on lifestyle changes and is reluctant to start a statin at least for now.  She has been doing ambulatory blood pressure measurements.  129/80, 124/85, 137/85, 116/84, 114/76, 121/78, 122/80, 134/83.   Past Medical/Surgical History: Past Medical History:  Diagnosis Date   Adrenal adenoma    Allergy    Anemia    Diabetes mellitus without complication (HCC)    Endometriosis of rectovaginal septum    GERD (gastroesophageal reflux disease)    Headache    MIGRAINES   Hyperaldosteronism    Possibly   Hyperlipidemia    Hypertension    Hypokalemia    Ovarian cyst    Raynaud disease    hands and feet     Past Surgical History:  Procedure Laterality Date   CARDIAC CATHETERIZATION     CESAREAN SECTION     X 2   ROBOTIC ASSISTED TOTAL HYSTERECTOMY WITH BILATERAL SALPINGO OOPHERECTOMY Bilateral 06/16/2014   Procedure: ROBOTIC ASSISTED TOTAL HYSTERECTOMY WITH BILATERAL SALPINGO OOPHORECTOMY, LYSIS OF ADHESIONS;  Surgeon: Maurilio Ship, MD;  Location: [facility];  Service: Gynecology;  Laterality: Bilateral;   TUBAL LIGATION      Social History:  reports that she has never smoked. She has never used smokeless tobacco. She reports that she does not currently use alcohol. She reports that she does not use drugs.  Allergies: Allergies  Allergen Reactions   Ace Inhibitors Cough   Hctz [Hydrochlorothiazide] Cough    Family History:  Family History  Problem Relation Age of Onset   Stroke Mother    Diabetes Mother    Lung cancer Mother    Diabetes Father    Hepatitis C Father    Colon cancer Father 52   Pancreatic cancer Father     Stroke Brother    Hypertension Brother    Scleroderma Maternal Grandmother    Stroke Maternal Grandfather    Lupus Maternal Aunt    Lupus Paternal Aunt    Breast cancer Neg Hx    Esophageal cancer Neg Hx    Stomach cancer Neg Hx      Current Outpatient Medications:    amoxicillin  (AMOXIL ) 500 MG tablet, Take 1 tablet (500 mg total) by mouth 3 (three) times daily. 10 days, Disp: 30 tablet, Rfl: 0   BD PEN NEEDLE MICRO U/F 32G X 6 MM MISC, USE ONCE DAILY AS DIRECTED, Disp: 100 each, Rfl: 2   Blood Glucose Monitoring Suppl (ONE TOUCH ULTRA MINI) w/Device KIT, See admin instructions. for testing, Disp: , Rfl: 0   Continuous Glucose Receiver (DEXCOM G7 RECEIVER) DEVI, 1 receiver, Disp: 1 each, Rfl: 0   Continuous Glucose Sensor (DEXCOM G7 SENSOR) MISC, Apply new sensor every 10 days, Disp: 3 each, Rfl: 3   glucose blood test strip, Use as instructed, Disp: 100 each, Rfl: 12   insulin  glargine (LANTUS  SOLOSTAR) 100 UNIT/ML Solostar Pen, Inject 20 Units into the skin at bedtime., Disp: 15 mL, Rfl: 1   Magnesium  250 MG TABS, Take by mouth., Disp: , Rfl:    metFORMIN  (GLUCOPHAGE -XR) 500 MG 24 hr tablet, Take 1 tablet (500 mg total) by mouth daily with breakfast., Disp:  90 tablet, Rfl: 0   spironolactone  (ALDACTONE ) 50 MG tablet, Take 1 tablet (50 mg total) by mouth daily., Disp: 90 tablet, Rfl: 0   Vitamin D , Ergocalciferol , (DRISDOL ) 1.25 MG (50000 UNIT) CAPS capsule, Take 1 capsule (50,000 Units total) by mouth every 7 (seven) days., Disp: 12 capsule, Rfl: 7  Review of Systems:  Negative unless indicated in HPI.   Physical Exam: Vitals:   03/19/24 0923  BP: 125/78  Pulse: 82  Temp: 97.7 F (36.5 C)  TempSrc: Oral  SpO2: 99%  Weight: 188 lb 3.2 oz (85.4 kg)  Height: 5' 7.5 (1.715 m)    Body mass index is 29.04 kg/m.   Physical Exam Vitals reviewed.  Constitutional:      Appearance: Normal appearance.  HENT:     Head: Normocephalic and atraumatic.  Eyes:      Conjunctiva/sclera: Conjunctivae normal.  Cardiovascular:     Rate and Rhythm: Normal rate and regular rhythm.  Pulmonary:     Effort: Pulmonary effort is normal.     Breath sounds: Normal breath sounds.  Skin:    General: Skin is warm and dry.  Neurological:     General: No focal deficit present.     Mental Status: She is alert and oriented to person, place, and time.  Psychiatric:        Mood and Affect: Mood normal.        Behavior: Behavior normal.        Thought Content: Thought content normal.        Judgment: Judgment normal.      Impression and Plan:  Hypertension associated with diabetes (HCC) Assessment & Plan: Fairly well-controlled.  Continue to watch off medication.   Type 2 diabetes mellitus with hyperlipidemia (HCC) Assessment & Plan: Followed by endocrinology, A1c has decreased to 7.3.   Hyperlipidemia associated with type 2 diabetes mellitus (HCC) Assessment & Plan: Most recent lipid panel from September 2025 with a total cholesterol of 210, HDL 60, LDL 142 and triglycerides 46.  We have again discussed importance of statin to reduce cardiovascular risk.  She declines.  She will continue to work on lifestyle changes.      Time spent:32 minutes reviewing chart, interviewing and examining patient and formulating plan of care.     Tully Theophilus Andrews, MD [facility]

## 2024-03-24 ENCOUNTER — Encounter (HOSPITAL_COMMUNITY): Payer: Self-pay

## 2024-03-24 ENCOUNTER — Ambulatory Visit (HOSPITAL_COMMUNITY): Admission: RE | Admit: 2024-03-24 | Source: Ambulatory Visit

## 2024-03-29 ENCOUNTER — Ambulatory Visit (HOSPITAL_BASED_OUTPATIENT_CLINIC_OR_DEPARTMENT_OTHER)

## 2024-04-01 ENCOUNTER — Ambulatory Visit
Admission: RE | Admit: 2024-04-01 | Discharge: 2024-04-01 | Disposition: A | Discharge status: Home / Self Care | Source: Ambulatory Visit | Attending: "Endocrinology | Admitting: "Endocrinology

## 2024-04-01 DIAGNOSIS — E1169 Type 2 diabetes mellitus with other specified complication: Secondary | ICD-10-CM

## 2024-04-01 MED ORDER — IOPAMIDOL (ISOVUE-300) INJECTION 61%
100.0000 mL | Freq: Once | INTRAVENOUS | Status: AC | PRN
  Administered 2024-04-01: 100 mL via INTRAVENOUS

## 2024-04-28 ENCOUNTER — Encounter: Payer: Self-pay | Admitting: "Endocrinology

## 2024-04-28 ENCOUNTER — Other Ambulatory Visit: Payer: Self-pay

## 2024-04-28 DIAGNOSIS — E119 Type 2 diabetes mellitus without complications: Secondary | ICD-10-CM

## 2024-04-28 MED ORDER — DEXCOM G7 SENSOR MISC: 2 refills | Status: AC

## 2024-06-23 ENCOUNTER — Encounter: Admitting: Internal Medicine

## 2024-07-07 ENCOUNTER — Encounter: Payer: Self-pay | Admitting: "Endocrinology

## 2024-07-08 ENCOUNTER — Other Ambulatory Visit: Payer: Self-pay

## 2024-07-08 ENCOUNTER — Ambulatory Visit: Admitting: Nutrition

## 2024-07-08 ENCOUNTER — Ambulatory Visit: Admitting: "Endocrinology

## 2024-07-08 DIAGNOSIS — E119 Type 2 diabetes mellitus without complications: Secondary | ICD-10-CM

## 2024-07-08 MED ORDER — EMBECTA PEN NEEDLE ULTRAFINE 32G X 6 MM MISC: 1 refills | Status: AC

## 2024-09-16 ENCOUNTER — Encounter: Admitting: Nutrition

## 2024-09-16 ENCOUNTER — Ambulatory Visit: Admitting: "Endocrinology

## 2024-10-21 ENCOUNTER — Encounter: Admitting: Internal Medicine
